# Patient Record
Sex: Male | Born: 1935 | Race: Black or African American | Hispanic: No | Marital: Married | Smoking: Former smoker
Health system: Southern US, Community
[De-identification: ages and names within clinical notes are randomized; demographics above are authoritative.]

## PROBLEM LIST (undated history)

## (undated) DIAGNOSIS — E7800 Pure hypercholesterolemia, unspecified: Secondary | ICD-10-CM

## (undated) DIAGNOSIS — E119 Type 2 diabetes mellitus without complications: Secondary | ICD-10-CM

## (undated) DIAGNOSIS — J449 Chronic obstructive pulmonary disease, unspecified: Secondary | ICD-10-CM

## (undated) DIAGNOSIS — M25569 Pain in unspecified knee: Secondary | ICD-10-CM

## (undated) DIAGNOSIS — I4891 Unspecified atrial fibrillation: Secondary | ICD-10-CM

## (undated) DIAGNOSIS — M549 Dorsalgia, unspecified: Secondary | ICD-10-CM

## (undated) DIAGNOSIS — I10 Essential (primary) hypertension: Secondary | ICD-10-CM

## (undated) HISTORY — PX: BACK SURGERY: SHX140

## (undated) HISTORY — PX: TRANSURETHRAL RESECTION OF PROSTATE: SHX73

## (undated) HISTORY — PX: JOINT REPLACEMENT: SHX530

## (undated) HISTORY — PX: MOUTH SURGERY: SHX715

## (undated) HISTORY — PX: HIP SURGERY: SHX245

## (undated) HISTORY — PX: REPLACEMENT TOTAL KNEE: SUR1224

---

## 2002-07-30 ENCOUNTER — Emergency Department (HOSPITAL_COMMUNITY): Admission: EM | Admit: 2002-07-30 | Discharge: 2002-07-30 | Payer: Self-pay | Admitting: Emergency Medicine

## 2002-12-04 ENCOUNTER — Emergency Department (HOSPITAL_COMMUNITY): Admission: EM | Admit: 2002-12-04 | Discharge: 2002-12-04 | Payer: Self-pay | Admitting: Emergency Medicine

## 2008-04-27 ENCOUNTER — Encounter: Admission: RE | Admit: 2008-04-27 | Discharge: 2008-05-21 | Payer: Self-pay | Admitting: Orthopedic Surgery

## 2015-06-21 DIAGNOSIS — M1990 Unspecified osteoarthritis, unspecified site: Secondary | ICD-10-CM | POA: Insufficient documentation

## 2015-10-11 DIAGNOSIS — M47817 Spondylosis without myelopathy or radiculopathy, lumbosacral region: Secondary | ICD-10-CM | POA: Insufficient documentation

## 2015-12-11 ENCOUNTER — Observation Stay (HOSPITAL_COMMUNITY)
Admission: EM | Admit: 2015-12-11 | Discharge: 2015-12-12 | Disposition: A | Discharge status: Home / Self Care | Payer: Non-veteran care | Attending: Internal Medicine | Admitting: Internal Medicine

## 2015-12-11 ENCOUNTER — Encounter (HOSPITAL_COMMUNITY): Payer: Self-pay | Admitting: Emergency Medicine

## 2015-12-11 ENCOUNTER — Emergency Department (HOSPITAL_COMMUNITY): Payer: Non-veteran care

## 2015-12-11 DIAGNOSIS — I482 Chronic atrial fibrillation, unspecified: Secondary | ICD-10-CM

## 2015-12-11 DIAGNOSIS — E119 Type 2 diabetes mellitus without complications: Secondary | ICD-10-CM | POA: Diagnosis not present

## 2015-12-11 DIAGNOSIS — Z7982 Long term (current) use of aspirin: Secondary | ICD-10-CM | POA: Diagnosis not present

## 2015-12-11 DIAGNOSIS — Z87891 Personal history of nicotine dependence: Secondary | ICD-10-CM | POA: Insufficient documentation

## 2015-12-11 DIAGNOSIS — R55 Syncope and collapse: Secondary | ICD-10-CM | POA: Diagnosis not present

## 2015-12-11 DIAGNOSIS — J449 Chronic obstructive pulmonary disease, unspecified: Secondary | ICD-10-CM | POA: Diagnosis not present

## 2015-12-11 DIAGNOSIS — Z79899 Other long term (current) drug therapy: Secondary | ICD-10-CM | POA: Insufficient documentation

## 2015-12-11 DIAGNOSIS — I10 Essential (primary) hypertension: Secondary | ICD-10-CM | POA: Insufficient documentation

## 2015-12-11 DIAGNOSIS — G8929 Other chronic pain: Secondary | ICD-10-CM | POA: Diagnosis not present

## 2015-12-11 DIAGNOSIS — G4733 Obstructive sleep apnea (adult) (pediatric): Secondary | ICD-10-CM | POA: Diagnosis not present

## 2015-12-11 DIAGNOSIS — I459 Conduction disorder, unspecified: Secondary | ICD-10-CM

## 2015-12-11 DIAGNOSIS — Z7901 Long term (current) use of anticoagulants: Secondary | ICD-10-CM | POA: Insufficient documentation

## 2015-12-11 DIAGNOSIS — I4891 Unspecified atrial fibrillation: Secondary | ICD-10-CM | POA: Diagnosis present

## 2015-12-11 DIAGNOSIS — E785 Hyperlipidemia, unspecified: Secondary | ICD-10-CM | POA: Insufficient documentation

## 2015-12-11 HISTORY — DX: Pain in unspecified knee: M25.569

## 2015-12-11 HISTORY — DX: Essential (primary) hypertension: I10

## 2015-12-11 HISTORY — DX: Dorsalgia, unspecified: M54.9

## 2015-12-11 HISTORY — DX: Unspecified atrial fibrillation: I48.91

## 2015-12-11 HISTORY — DX: Type 2 diabetes mellitus without complications: E11.9

## 2015-12-11 HISTORY — DX: Pure hypercholesterolemia, unspecified: E78.00

## 2015-12-11 LAB — COMPREHENSIVE METABOLIC PANEL
ALT: 14 U/L — ABNORMAL LOW (ref 17–63)
AST: 22 U/L (ref 15–41)
Albumin: 3.2 g/dL — ABNORMAL LOW (ref 3.5–5.0)
Alkaline Phosphatase: 70 U/L (ref 38–126)
Anion gap: 5 (ref 5–15)
BUN: 20 mg/dL (ref 6–20)
CO2: 23 mmol/L (ref 22–32)
Calcium: 9.3 mg/dL (ref 8.9–10.3)
Chloride: 109 mmol/L (ref 101–111)
Creatinine, Ser: 1.33 mg/dL — ABNORMAL HIGH (ref 0.61–1.24)
GFR calc Af Amer: 55 mL/min — ABNORMAL LOW (ref >60)
GFR calc non Af Amer: 47 mL/min — ABNORMAL LOW (ref >60)
Glucose, Bld: 175 mg/dL — ABNORMAL HIGH (ref 65–99)
Potassium: 4.6 mmol/L (ref 3.5–5.1)
Sodium: 137 mmol/L (ref 135–145)
Total Bilirubin: 0.6 mg/dL (ref 0.3–1.2)
Total Protein: 6.2 g/dL — ABNORMAL LOW (ref 6.5–8.1)

## 2015-12-11 LAB — GLUCOSE, CAPILLARY
GLUCOSE-CAPILLARY: 101 mg/dL — AB (ref 65–99)
Glucose-Capillary: 138 mg/dL — ABNORMAL HIGH (ref 65–99)

## 2015-12-11 LAB — CBC WITH DIFFERENTIAL/PLATELET
Basophils Absolute: 0 10*3/uL (ref 0.0–0.1)
Basophils Relative: 0 %
Eosinophils Absolute: 0.1 10*3/uL (ref 0.0–0.7)
Eosinophils Relative: 1 %
HCT: 38 % — ABNORMAL LOW (ref 39.0–52.0)
Hemoglobin: 12.3 g/dL — ABNORMAL LOW (ref 13.0–17.0)
Lymphocytes Relative: 20 %
Lymphs Abs: 1.2 10*3/uL (ref 0.7–4.0)
MCH: 28.5 pg (ref 26.0–34.0)
MCHC: 32.4 g/dL (ref 30.0–36.0)
MCV: 88 fL (ref 78.0–100.0)
Monocytes Absolute: 0.6 10*3/uL (ref 0.1–1.0)
Monocytes Relative: 11 %
Neutro Abs: 4.1 10*3/uL (ref 1.7–7.7)
Neutrophils Relative %: 68 %
Platelets: 250 10*3/uL (ref 150–400)
RBC: 4.32 MIL/uL (ref 4.22–5.81)
RDW: 14.2 % (ref 11.5–15.5)
WBC: 6 10*3/uL (ref 4.0–10.5)

## 2015-12-11 LAB — TROPONIN I: Troponin I: <0.03 ng/mL

## 2015-12-11 LAB — PROTIME-INR
INR: 2.87
Prothrombin Time: 30.6 seconds — ABNORMAL HIGH (ref 11.4–15.2)

## 2015-12-11 LAB — I-STAT TROPONIN, ED: Troponin i, poc: 0 ng/mL (ref 0.00–0.08)

## 2015-12-11 MED ORDER — PANTOPRAZOLE SODIUM 40 MG PO TBEC
40.0000 mg | DELAYED_RELEASE_TABLET | Freq: Every day | ORAL | Status: DC
  Administered 2015-12-11: 40 mg via ORAL
  Filled 2015-12-11: qty 1

## 2015-12-11 MED ORDER — ACETAMINOPHEN 325 MG PO TABS: 650.0000 mg | ORAL_TABLET | Freq: Four times a day (QID) | ORAL | Status: DC | PRN

## 2015-12-11 MED ORDER — INSULIN ASPART 100 UNIT/ML ~~LOC~~ SOLN: 0.0000 [IU] | Freq: Every day | SUBCUTANEOUS | Status: DC

## 2015-12-11 MED ORDER — WARFARIN - PHARMACIST DOSING INPATIENT: Freq: Every day | Status: DC

## 2015-12-11 MED ORDER — TIOTROPIUM BROMIDE MONOHYDRATE 18 MCG IN CAPS
18.0000 ug | ORAL_CAPSULE | Freq: Every day | RESPIRATORY_TRACT | Status: DC
  Administered 2015-12-12: 18 ug via RESPIRATORY_TRACT
  Filled 2015-12-11: qty 5

## 2015-12-11 MED ORDER — ONDANSETRON HCL 4 MG/2ML IJ SOLN: 4.0000 mg | Freq: Four times a day (QID) | INTRAMUSCULAR | Status: DC | PRN

## 2015-12-11 MED ORDER — LATANOPROST 0.005 % OP SOLN
1.0000 [drp] | Freq: Every day | OPHTHALMIC | Status: DC
  Administered 2015-12-11: 1 [drp] via OPHTHALMIC
  Filled 2015-12-11: qty 2.5

## 2015-12-11 MED ORDER — ASPIRIN 81 MG PO CHEW
81.0000 mg | CHEWABLE_TABLET | Freq: Every day | ORAL | Status: DC
  Administered 2015-12-12: 81 mg via ORAL
  Filled 2015-12-11: qty 1

## 2015-12-11 MED ORDER — INSULIN ASPART 100 UNIT/ML ~~LOC~~ SOLN: 0.0000 [IU] | Freq: Three times a day (TID) | SUBCUTANEOUS | Status: DC

## 2015-12-11 MED ORDER — HYPROMELLOSE (GONIOSCOPIC) 2.5 % OP SOLN
1.0000 [drp] | Freq: Four times a day (QID) | OPHTHALMIC | Status: DC | PRN
  Filled 2015-12-11: qty 15

## 2015-12-11 MED ORDER — MOMETASONE FURO-FORMOTEROL FUM 200-5 MCG/ACT IN AERO
2.0000 | INHALATION_SPRAY | Freq: Two times a day (BID) | RESPIRATORY_TRACT | Status: DC
  Administered 2015-12-11 – 2015-12-12 (×2): 2 via RESPIRATORY_TRACT
  Filled 2015-12-11: qty 8.8

## 2015-12-11 MED ORDER — BRIMONIDINE TARTRATE 0.2 % OP SOLN
1.0000 [drp] | Freq: Two times a day (BID) | OPHTHALMIC | Status: DC
  Administered 2015-12-11 – 2015-12-12 (×2): 1 [drp] via OPHTHALMIC
  Filled 2015-12-11: qty 5

## 2015-12-11 NOTE — Progress Notes (Signed)
New admit, no distress ra, vitals stable, Reported off to night shift nurse, RT assessing for a CPAP.

## 2015-12-11 NOTE — Progress Notes (Signed)
Patient set up on CPAP for HS using nasal mask and room air.  10 cmH20 used, as patient is unsure of his home settings.  Patient is familiar with equipment and procedure.

## 2015-12-11 NOTE — H&P (Signed)
Date: 12/11/2015               Patient Name:  Jordan Hoffman MRN: [id number]  DOB: 01-Jun-1928 Age / Sex: 82 y.o., male   PCP: No Pcp Per Patient         Medical Service: Internal Medicine Teaching Service         Attending Physician: Dr. Sid Falcon, MD    First Contact: Dr. Inda Castle Pager: [phone]  Second Contact: Dr. Benjamine Mola Pager: [phone]       After Hours (After 5p/  First Contact Pager: [phone]  weekends / holidays): Second Contact Pager: [phone]   Chief Complaint: "I sat down to eat breakfast and almost passed out."  History of Present Illness: Jordan Hoffman is an 82 year old man with history of AFib, DM2, COPD, HL, and HTN who presents with near syncopal episode.  This morning, Jordan Hoffman was in his usual state of health.  He woke up and was slightly short of breath while getting dressed and walking around the house, but no worse than usual.  He then went to breakfast at [employer] with hs family.  He was standing waiting for a table for 20-30 minutes, and shortly after he sat down he began to feel hot, dizzy, and diaphoretic, then nearly passed out.  According to his grandson, he slumped over the table with his head in his hands for 2-3 minutes.  He did not lose consciousness, and continued talking the whole time.  By the time EMS arrived and he was placed on the stretched and rolled outside he was feeling greatly improved.  No palpitations, dyspnea, chest pain, weakness/numbness, vertigo, headache, diplopia, facial droop, dysarthria.  EMS reported several 2-3s pauses while on cardiac monitor with HRs 23-80 bpm, gave 500 mL NS.  In the ED he has been asymptomatic, in atrial fibrillation with normal rates, feels near baseline with the exception of some fatigue.  Through yesterday he has been eating and drinking well.  He sometimes wakes up at night gasping for breath, which he attributes to his OSA.  Sleeps on one pillow with an adjustable bed at a shallow incline, which has not  changed recently.  He does not regularly exercise and will get short of breath walking or going up stairs, but has not noted any change in his exercise capacity.  He   He reports a recent heart catheterization at the [facility] in 09/2015 which showed 40% occlusion of one vessel.  Has long history of AFib, and reports recent discussions with his [place] cardiologist about potentially needing a pacemaker.  Meds:  Current Meds  Medication Sig  . acetaminophen (TYLENOL) 325 MG tablet Take 650-975 mg by mouth 3 (three) times daily as needed.  Marland Kitchen albuterol (PROVENTIL HFA) 108 (90 Base) MCG/ACT inhaler Inhale 2 puffs into the lungs every 6 (six) hours as needed for wheezing or shortness of breath.  Marland Kitchen aspirin 81 MG chewable tablet Chew 81 mg by mouth daily.  . brimonidine (ALPHAGAN) 0.2 % ophthalmic solution Place 1 drop into both eyes 2 (two) times daily.  . budesonide-formoterol (SYMBICORT) 160-4.5 MCG/ACT inhaler Inhale 2 puffs into the lungs 2 (two) times daily.  Marland Kitchen CALCIUM PO Take 1 tablet by mouth daily.  . Cholecalciferol (VITAMIN D3) 1000 units CAPS Take 1 capsule by mouth daily.  . diphenhydrAMINE (BENADRYL) 25 MG tablet Take 25 mg by mouth every 6 (six) hours as needed for sleep.  Marland Kitchen doxycycline (ADOXA) 50  MG tablet Take 50 mg by mouth daily.  Marland Kitchen gabapentin (NEURONTIN) 300 MG capsule Take 300 mg by mouth 3 (three) times daily.  . hydroxypropyl methylcellulose / hypromellose (ISOPTO TEARS / GONIOVISC) 2.5 % ophthalmic solution Place 1 drop into both eyes 4 (four) times daily as needed for dry eyes.  Marland Kitchen latanoprost (XALATAN) 0.005 % ophthalmic solution Place 1 drop into both eyes at bedtime.  Marland Kitchen loratadine (CLARITIN) 10 MG tablet Take 10 mg by mouth daily.  Marland Kitchen losartan (COZAAR) 25 MG tablet Take 25 mg by mouth daily.  Marland Kitchen omeprazole (PRILOSEC) 40 MG capsule Take 40 mg by mouth daily.  Marland Kitchen tiotropium (SPIRIVA) 18 MCG inhalation capsule Place 18 mcg into inhaler and inhale daily.  Marland Kitchen warfarin (COUMADIN) 7.5 MG  tablet Take 7.5 mg by mouth daily.     Allergies: Allergies as of 12/11/2015 - Review Complete 12/11/2015  Allergen Reaction Noted  . Atorvastatin Other (See Comments) 10/11/2015  . Ciprofloxacin  12/11/2015  . Codeine  12/11/2015  . Erythromycin  12/11/2015  . Flunisolide Other (See Comments) 10/11/2015  . Lisinopril  12/11/2015  . Loratadine  12/11/2015  . Niacin and related  12/11/2015  . Penicillins Hives 12/11/2015  . Pravastatin Other (See Comments) 10/11/2015  . Simvastatin Other (See Comments) 10/11/2015  . Statins  12/11/2015  . Sulfa antibiotics  12/11/2015  . Zantac [ranitidine]  12/11/2015   Past Medical History:  Diagnosis Date  . Atrial fibrillation ([place])   . Back pain   . Diabetes mellitus without complication ([place])   . High cholesterol   . Hypertension   . Knee pain     Family History:  Mother and father with lung cancer, HTN No MI or CVA  Social History: No alcohol Former smoker, quit 30+ years ago, 50 pack year history No illicit drugs  Review of Systems: A complete ROS was negative except as per HPI.  Physical Exam: Blood pressure 131/80, pulse 67, temperature 97.9 F (36.6 C), temperature source Oral, resp. rate 20, height 6\' 4"  (1.93 m), weight 222 lb 14.2 oz (101.1 kg), SpO2 96 %.  Physical Exam  Constitutional:  Well developed elderly gentleman, appears stated age, in no distress  HENT:  Head: Normocephalic and atraumatic.  Mouth/Throat: Oropharynx is clear and moist.  Eyes: Conjunctivae are normal. Pupils are equal, round, and reactive to light. No scleral icterus.  Arcus senilis OU  Neck: Normal range of motion. Neck supple. No JVD present.  Cardiovascular:  Regular rate Irregularly irregular rhythm Normal S1 S2 No MRG  Pulmonary/Chest: Effort normal and breath sounds normal.  Trace bibasilar crackles  Abdominal: Soft. He exhibits no distension. There is no tenderness.  Musculoskeletal: He exhibits no edema or tenderness.    Neurological:  Alert and oriented CN intact Strength and sensation grossly intact and symmetric in all extremities  Skin: Skin is warm and dry.  Psychiatric: He has a normal mood and affect. His behavior is normal.    CBC Latest Ref Rng & Units 12/11/2015  WBC 4.0 - 10.5 K/uL 6.0  Hemoglobin 13.0 - 17.0 g/dL 12.3(L)  Hematocrit 39.0 - 52.0 % 38.0(L)  Platelets 150 - 400 K/uL 250   CMP Latest Ref Rng & Units 12/11/2015  Glucose 65 - 99 mg/dL 175(H)  BUN 6 - 20 mg/dL 20  Creatinine 0.61 - 1.24 mg/dL 1.33(H)  Sodium 135 - 145 mmol/L 137  Potassium 3.5 - 5.1 mmol/L 4.6  Chloride 101 - 111 mmol/L 109  CO2 22 - 32 mmol/L  23  Calcium 8.9 - 10.3 mg/dL 9.3  Total Protein 6.5 - 8.1 g/dL 6.2(L)  Total Bilirubin 0.3 - 1.2 mg/dL 0.6  Alkaline Phos 38 - 126 U/L 70  AST 15 - 41 U/L 22  ALT 17 - 63 U/L 14(L)   Troponin (Point of Care Test)  Recent Labs  12/11/15 1356  TROPIPOC 0.00   Component     Latest Ref Rng & Units 12/11/2015  Prothrombin Time     11.4 - 15.2 seconds 30.6 (H)  INR      2.87   EKG 12/11/2015 1327 Atrial fibrillation, regular rate, nonspecific ST changes. No priors.  Chest Radiograph AP Port 12/11/2015 IMPRESSION: There is elevation of the left hemidiaphragm. Left basilar atelectasis is noted. No pulmonary edema. No segmental infiltrate.  Assessment & Plan by Problem: Principal Problem:   Near syncope Active Problems:   Atrial fibrillation (HCC)   COPD (chronic obstructive pulmonary disease) (HCC)   OSA (obstructive sleep apnea)   Type 2 diabetes mellitus ([place])  82 year old man with known arrhythmia who had near syncopal episode with no prodromal symptoms and rapid return to baseline.  Tachy/bradyarrhythmia or vasovagal syndrome seem to be most likely etiologies.   #Presyncope His near syncopal episode after prolonged standing and without prodrome are most concerning for arrhythmia or vasovagal syndrome.  [place] of prodromal symptoms and standing to  sitting postural change make orthostasis less likely.  No focal neurologic deficts and rapid return to baseline make CVA less likely, and his INR is therapeutic.  No convulsions, LOC, postictal state to suggest seizure.  No chest discomfort or dyspnea to suggest ACS, and his EKG is nonischemic and initial troponin negative. -Telemetry -Trend troponins -Orthostatics -Repat EKG in AM or if symptoms  #Atrial Fibrillation CHA2DS2-Vasc 4. -Continue warfarin -Request [facility]  #COPD -Continue home Spiriva, Advair -PRN DuoNebs  #DM2 -Hold oral hypoglycemics -SSI  #HTN -Continue home   #Chronic Pain -Continue Gabapentin 300 mg TID   Dispo: Admit patient to Observation with expected length of stay less than 2 midnights.  Signed: Minus Liberty, MD 12/11/2015, 6:30 PM  Pager: [phone]

## 2015-12-11 NOTE — ED Provider Notes (Signed)
[place] DEPT Provider Note   CSN: [id number] Arrival date & time: 12/11/15  1322     History   Chief Complaint Chief Complaint  Patient presents with  . Near Syncope    HPI Jordan Hoffman is a 82 y.o. male.  The history is provided by the patient and medical records.  Near Syncope    82 year old male with history of A. fib, chronic back pain, diabetes, hyperlipidemia, hypertension, presenting to the ED following a near syncopal episode. Patient was at a restaurant eating with his family when he became dizzy diaphoretic, and dazed.  No fall to the ground, no head injury. Upon EMS arrival, patient was no longer diaphoretic.  He was placed on the cardiac monitor and had several 2-3 second pauses.  He remains in AFIB which is chronic for him.  He takes coumadin regularly.  Denies prior hx of CABG or stenting.  States he had a cold about 3 weeks ago, that cleared up with medications from his doctor.  Adjusted his AFIB medications about 2 months ago, have tolerated changes well.  States he was on Tikosyn and beta blockers at one point.  There have been some discussions with cardiology about possible pacemaker placement.  Patient is at his neurologic baseline per family.  Patient denies any chest pain or SOB.  Patient does use 2L O2 via nasal cannula PRN.  He also uses CPAP at night for sleep apnea.  Past Medical History:  Diagnosis Date  . Atrial fibrillation ([place])   . Back pain   . Diabetes mellitus without complication ([place])   . High cholesterol   . Hypertension   . Knee pain     There are no active problems to display for this patient.   Past Surgical History:  Procedure Laterality Date  . BACK SURGERY    . MOUTH SURGERY    . REPLACEMENT TOTAL KNEE Right   . TRANSURETHRAL RESECTION OF PROSTATE         Home Medications    Prior to Admission medications   Not on File    Family History No family history on file.  Social History Social History  Substance Use  Topics  . Smoking status: Never Smoker  . Smokeless tobacco: Not on file  . Alcohol use No     Allergies   Ciprofloxacin; Codeine; Erythromycin; Lisinopril; Loratadine; Niacin and related; Penicillins; Statins; Sulfa antibiotics; and Zantac [ranitidine]   Review of Systems Review of Systems  Cardiovascular: Positive for near-syncope.  Neurological: Positive for syncope.  All other systems reviewed and are negative.    Physical Exam Updated Vital Signs BP 118/74   Pulse (!) 54   Temp 97.9 F (36.6 C) (Oral)   Resp 16   Ht 6\' 4"  (1.93 m)   Wt 98.9 kg   SpO2 98%   BMI 26.54 kg/m   Physical Exam  Constitutional: He is oriented to person, place, and time. He appears well-developed and well-nourished.  Overall well appearing, not diaphoretic on my exam  HENT:  Head: Normocephalic and atraumatic.  Mouth/Throat: Oropharynx is clear and moist.  Eyes: Conjunctivae and EOM are normal. Pupils are equal, round, and reactive to light.  Neck: Normal range of motion.  Cardiovascular: Normal rate and normal heart sounds.  An irregularly irregular rhythm present.  AFIB, rate controlled Single pause noted on monitor during exam, patient asymptomatic of this  Pulmonary/Chest: Effort normal and breath sounds normal. No respiratory distress. He has no wheezes.  Abdominal: Soft.  Bowel sounds are normal.  Musculoskeletal: Normal range of motion.  Neurological: He is alert and oriented to person, place, and time.  Skin: Skin is warm and dry.  Psychiatric: He has a normal mood and affect.  Nursing note and vitals reviewed.    ED Treatments / Results  Labs (all labs ordered are listed, but only abnormal results are displayed) Labs Reviewed  CBC WITH DIFFERENTIAL/PLATELET - Abnormal; Notable for the following:       Result Value   Hemoglobin 12.3 (*)    HCT 38.0 (*)    All other components within normal limits  COMPREHENSIVE METABOLIC PANEL - Abnormal; Notable for the following:     Glucose, Bld 175 (*)    Creatinine, Ser 1.33 (*)    Total Protein 6.2 (*)    Albumin 3.2 (*)    ALT 14 (*)    GFR calc non Af Amer 47 (*)    GFR calc Af Amer 55 (*)    All other components within normal limits  PROTIME-INR - Abnormal; Notable for the following:    Prothrombin Time 30.6 (*)    All other components within normal limits  I-STAT TROPOININ, ED    EKG  EKG Interpretation  Date/Time:  Saturday December 11 2015 13:27:24 EST Ventricular Rate:  80 PR Interval:    QRS Duration: 101 QT Interval:  372 QTC Calculation: 430 R Axis:   -44 Text Interpretation:  probable Atrial fibrillation , new since last tracing Left anterior fascicular block Borderline T abnormalities, inferior leads Confirmed by KNAPP  MD-J, JON ([id number]) on 12/11/2015 1:43:39 PM Also confirmed by KNAPP  MD-J, JON [phone]), editor [place]  CCT, BEVERLY ([id number])  on 12/11/2015 1:50:22 PM       Radiology Dg Chest Port 1 View  Result Date: 12/11/2015 CLINICAL DATA:  Dizziness EXAM: PORTABLE CHEST 1 VIEW COMPARISON:  None. FINDINGS: Cardiomegaly is noted. There is elevation of the left hemidiaphragm. Left basilar atelectasis is noted. No pulmonary edema. No segmental infiltrate. Degenerative changes bilateral shoulders. IMPRESSION: There is elevation of the left hemidiaphragm. Left basilar atelectasis is noted. No pulmonary edema. No segmental infiltrate. Electronically Signed   By: Lahoma Crocker M.D.   On: 12/11/2015 14:28    Procedures Procedures (including critical care time)  Medications Ordered in ED Medications - No data to display   Initial Impression / Assessment and Plan / ED Course  I have reviewed the triage vital signs and the nursing notes.  Pertinent labs & imaging results that were available during my care of the patient were reviewed by me and considered in my medical decision making (see chart for details).  Clinical Course    82 year old male here with near syncopal episode at a  restaurant. Upon EMS arrival, patient was noted to have conduction delay and questionable pulses.  He does have history of chronic A. fib. They should is primarily followed at the [place]. Per family, there've been discussions about pacemaker placement the past.  Recent medication changes about 2 months ago for same. On my evaluation, patient is awake, alert, oriented to his baseline per family at the bedside. He is not diaphoretic and is in no apparent distress at this time. He is neurologically intact. Denies any chest pain or shortness of breath.  During evaluation, patient did have one episode of slowed conduction.  He was asymptomatic during this.  Labs and CXR pending.    Lab work overall reassuring.  INR therapeutic, feel PE less likely. It appears  that patient has been having issues with conduction delay for a while now based on family history. This may be the source of his near syncope. Given this, feel patient would benefit from admission and observation.  Discussed this with patient and family at bedside, they all acknowledged understanding and agreed with admission.  Discussed with teaching service, will admit for observation.  Temp admit orders placed.  Final Clinical Impressions(s) / ED Diagnoses   Final diagnoses:  Near syncope  Intra-ventricular conduction delay  Chronic atrial fibrillation ([place])  Anticoagulated on Coumadin    New Prescriptions New Prescriptions   No medications on file     Larene Pickett, PA-C 12/11/15 1521    Dorie Rank, MD 12/12/15 1640

## 2015-12-11 NOTE — Progress Notes (Signed)
ANTICOAGULATION CONSULT NOTE - Initial Consult  Pharmacy Consult for Warfarin Indication: atrial fibrillation  Allergies  Allergen Reactions  . Atorvastatin Other (See Comments)  . Ciprofloxacin   . Codeine   . Erythromycin   . Flunisolide Other (See Comments)  . Lisinopril   . Loratadine   . Niacin And Related   . Penicillins Hives  . Pravastatin Other (See Comments)  . Simvastatin Other (See Comments)  . Statins   . Sulfa Antibiotics   . Zantac [Ranitidine]     Patient Measurements: Height: 6\' 4"  (193 cm) Weight: 222 lb 14.2 oz (101.1 kg) (scale QA) IBW/kg (Calculated) : 86.8  Vital Signs: Temp: 97.9 F (36.6 C) (11/25 1654) Temp Source: Oral (11/25 1654) BP: 131/80 (11/25 1654) Pulse Rate: 67 (11/25 1654)  Labs:  Recent Labs  12/11/15 1345  HGB 12.3*  HCT 38.0*  PLT 250  LABPROT 30.6*  INR 2.87  CREATININE 1.33*    Estimated Creatinine Clearance: 49.9 mL/min (by C-G formula based on SCr of 1.33 mg/dL (H)).   Medical History: Past Medical History:  Diagnosis Date  . Atrial fibrillation ([place])   . Back pain   . Diabetes mellitus without complication ([place])   . High cholesterol   . Hypertension   . Knee pain     Medications:  Prescriptions Prior to Admission  Medication Sig Dispense Refill Last Dose  . acetaminophen (TYLENOL) 325 MG tablet Take 650-975 mg by mouth 3 (three) times daily as needed.     Marland Kitchen albuterol (PROVENTIL HFA) 108 (90 Base) MCG/ACT inhaler Inhale 2 puffs into the lungs every 6 (six) hours as needed for wheezing or shortness of breath.     Marland Kitchen amLODipine (NORVASC) 10 MG tablet Take 10 mg by mouth daily.     Marland Kitchen aspirin 81 MG chewable tablet Chew 81 mg by mouth daily.     . brimonidine (ALPHAGAN) 0.2 % ophthalmic solution Place 1 drop into both eyes 2 (two) times daily.     . budesonide-formoterol (SYMBICORT) 160-4.5 MCG/ACT inhaler Inhale 2 puffs into the lungs 2 (two) times daily.     . hydroxypropyl methylcellulose / hypromellose  (ISOPTO TEARS / GONIOVISC) 2.5 % ophthalmic solution Place 1 drop into both eyes 4 (four) times daily as needed for dry eyes.     Marland Kitchen latanoprost (XALATAN) 0.005 % ophthalmic solution Place 1 drop into both eyes at bedtime.      Scheduled:  . aspirin  81 mg Oral Daily  . brimonidine  1 drop Both Eyes BID  . insulin aspart  0-5 Units Subcutaneous QHS  . insulin aspart  0-9 Units Subcutaneous TID WC  . latanoprost  1 drop Both Eyes QHS  . mometasone-formoterol  2 puff Inhalation BID  . pantoprazole  40 mg Oral QHS  . [START ON 12/12/2015] tiotropium  18 mcg Inhalation Daily   Infusions:    Assessment: 82yo male with history of Afib on warfarin PTA. Pharmacy is consulted to dose warfarin for atrial fibrillation. INR on admission is therapeutic at 2.87.  PTA Warfarin Dose: 7.5mg /day with last dose 11/25  Goal of Therapy:  INR 2-3 Monitor platelets by anticoagulation protocol: Yes   Plan:  Hold warfarin tonight x1 Daily INR Monitor s/sx of bleeding  Andrey Cota. Diona Foley, PharmD, [place] Clinical Pharmacist Pager [phone] 12/11/2015,5:10 PM

## 2015-12-11 NOTE — ED Triage Notes (Signed)
Pt arrives via [facility], pt was at a restaurant sitting with family when he became dizzy, diaphoretic and "dazed" per ems report. pts ekg showed 2 second pauses with afib, hx of afib.ems reports HR ranges from 23-80 bpm. Pt received 500 cc normal saline pta. Pt a/ox4, nad.

## 2015-12-12 ENCOUNTER — Encounter (HOSPITAL_COMMUNITY): Payer: Self-pay

## 2015-12-12 DIAGNOSIS — I482 Chronic atrial fibrillation: Secondary | ICD-10-CM | POA: Diagnosis not present

## 2015-12-12 DIAGNOSIS — R55 Syncope and collapse: Secondary | ICD-10-CM

## 2015-12-12 DIAGNOSIS — G4733 Obstructive sleep apnea (adult) (pediatric): Secondary | ICD-10-CM | POA: Diagnosis not present

## 2015-12-12 DIAGNOSIS — J449 Chronic obstructive pulmonary disease, unspecified: Secondary | ICD-10-CM | POA: Diagnosis not present

## 2015-12-12 LAB — TROPONIN I

## 2015-12-12 LAB — GLUCOSE, CAPILLARY
GLUCOSE-CAPILLARY: 100 mg/dL — AB (ref 65–99)
Glucose-Capillary: 130 mg/dL — ABNORMAL HIGH (ref 65–99)

## 2015-12-12 LAB — BASIC METABOLIC PANEL
Anion gap: 8 (ref 5–15)
BUN: 19 mg/dL (ref 6–20)
CHLORIDE: 110 mmol/L (ref 101–111)
CO2: 21 mmol/L — ABNORMAL LOW (ref 22–32)
CREATININE: 1.12 mg/dL (ref 0.61–1.24)
Calcium: 9.4 mg/dL (ref 8.9–10.3)
GFR, EST NON AFRICAN AMERICAN: 58 mL/min — AB (ref >60)
Glucose, Bld: 88 mg/dL (ref 65–99)
Potassium: 5.1 mmol/L (ref 3.5–5.1)
SODIUM: 139 mmol/L (ref 135–145)

## 2015-12-12 LAB — CBC
HCT: 38.6 % — ABNORMAL LOW (ref 39.0–52.0)
HEMOGLOBIN: 12.6 g/dL — AB (ref 13.0–17.0)
MCH: 28.6 pg (ref 26.0–34.0)
MCHC: 32.6 g/dL (ref 30.0–36.0)
MCV: 87.5 fL (ref 78.0–100.0)
PLATELETS: 263 10*3/uL (ref 150–400)
RBC: 4.41 MIL/uL (ref 4.22–5.81)
RDW: 14.6 % (ref 11.5–15.5)
WBC: 5.1 10*3/uL (ref 4.0–10.5)

## 2015-12-12 LAB — PROTIME-INR
INR: 3.01
PROTHROMBIN TIME: 31.8 s — AB (ref 11.4–15.2)

## 2015-12-12 LAB — MAGNESIUM: Magnesium: 1.8 mg/dL (ref 1.7–2.4)

## 2015-12-12 NOTE — Discharge Instructions (Signed)
You were admitted to the hospital because you almost passed out.  We made sure that you did not have a heart attack, and we did not see any strange rhythms on the heart monitor overnight that would have caused you to pass out.  This was most likely not from your heart, but it is important that you follow up with with PCP and cardiologist at the [facility].    Please call your PCP tomorrow to make an appointment in 1-2 weeks.  If you have any chest pain, worse shortness of breath, or feel like you are going to pass out again, please come back to the Emergency Department.  Near-Syncope Introduction Near-syncope is when you suddenly get weak or dizzy, or you feel like you might pass out (faint). During an episode of near-syncope, you may:  Feel dizzy or light-headed.  Feel sick to your stomach (nauseous).  See all white or all black.  Have cold, clammy skin. If you passed out, get help right away.Call your local emergency services (911 in the [place] Do not drive yourself to the hospital. Follow these instructions at home: Pay attention to any changes in your symptoms. Take these actions to help with your condition:  Have someone stay with you until you feel stable.  Do not drive, use machinery, or play sports until your doctor says it is okay.  Keep all follow-up visits as told by your doctor. This is important.  If you start to feel like you might pass out, lie down right away and raise (elevate) your feet above the level of your heart. Breathe deeply and steadily. Wait until all of the symptoms are gone.  Drink enough fluid to keep your pee (urine) clear or pale yellow.  If you are taking blood pressure or heart medicine, get up slowly and spend many minutes getting ready to sit and then stand. This can help with dizziness.  Take over-the-counter and prescription medicines only as told by your doctor. Get help right away if:  You have a very bad headache.  You have unusual pain in your  chest, tummy, or back.  You are bleeding from your mouth or rectum.  You have black or tarry poop (stool).  You have a very fast or uneven heartbeat (palpitations).  You pass out one time or more than once.  You have jerky movements that you cannot control (seizure).  You are confused.  You have trouble walking.  You are very weak.  You have vision problems. These symptoms may be an emergency. Do not wait to see if the symptoms will go away. Get medical help right away. Call your local emergency services (911 in the [place] Do not drive yourself to the hospital.  This information is not intended to replace advice given to you by your health care provider. Make sure you discuss any questions you have with your health care provider. Document Released: 06/21/2007 Document Revised: 06/10/2015 Document Reviewed: 09/16/2014  2017 Elsevier

## 2015-12-12 NOTE — Progress Notes (Signed)
Pt discharged home IV and tele. Discharge instructions given. Questions answered.

## 2015-12-12 NOTE — Progress Notes (Signed)
   Subjective: No acute events overnight.  Reports no syncope, dyspnea, chest pain, or palpitations.  Objective:  BP 138/77 (BP Location: Right Arm)   Pulse (!) 59   Temp 98.2 F (36.8 C) (Oral)   Resp 18   Ht 6\' 4"  (1.93 m)   Wt 221 lb 1.6 oz (100.3 kg) Comment: a scale  SpO2 95%   BMI 26.91 kg/m    Orthostatic VS for the past 24 hrs:  BP- Lying Pulse- Lying BP- Sitting Pulse- Sitting BP- Standing at 0 minutes Pulse- Standing at 0 minutes  12/12/15 1001 - - - - 154/84 103  12/12/15 0959 - - 117/80 87 - -  12/12/15 0957 134/76 91 - - - -  12/11/15 1717 - - - - 103/62 80  12/11/15 1715 - - 109/76 83 - -  12/11/15 1714 115/70 67 - - - -   Telemetry: atrial fibrillation, rates 60s-70s, occasional PVCs, infrequent short pauses <3s  Physical Exam  Constitutional: He is oriented to person, place, and time. He appears well-developed and well-nourished. No distress.  Cardiovascular: Normal rate and regular rhythm.   Pulmonary/Chest: Effort normal and breath sounds normal.  Neurological: He is alert and oriented to person, place, and time.  Psychiatric: He has a normal mood and affect. His behavior is normal.   CBC Latest Ref Rng & Units 12/12/2015 12/11/2015  WBC 4.0 - 10.5 K/uL 5.1 6.0  Hemoglobin 13.0 - 17.0 g/dL 12.6(L) 12.3(L)  Hematocrit 39.0 - 52.0 % 38.6(L) 38.0(L)  Platelets 150 - 400 K/uL 263 250   BMP Latest Ref Rng & Units 12/12/2015 12/11/2015  Glucose 65 - 99 mg/dL 88 175(H)  BUN 6 - 20 mg/dL 19 20  Creatinine 0.61 - 1.24 mg/dL 1.12 1.33(H)  Sodium 135 - 145 mmol/L 139 137  Potassium 3.5 - 5.1 mmol/L 5.1 4.6  Chloride 101 - 111 mmol/L 110 109  CO2 22 - 32 mmol/L 21(L) 23  Calcium 8.9 - 10.3 mg/dL 9.4 9.3   Cardiac Panel (last 3 results)  Recent Labs  12/11/15 1956 12/12/15 0423  TROPONINI <0.03 <0.03    Assessment/Plan:  Principal Problem:   Near syncope Active Problems:   Atrial fibrillation (HCC)   COPD (chronic obstructive pulmonary disease)  (HCC)   OSA (obstructive sleep apnea)   Type 2 diabetes mellitus ([place])  82 year old man with atrial fibrillation who had a presyncopal episode, most likely of vasovagal etiology.  He follows with cardiologist at [facility] and has been told he may need a pacemaker.  #Presyncope Most likely vasovagal with history of prolonged standing prior to event.  Orthostatic negative.  ACS ruled out with nonischemic EKGs and negative troponins.  Telemetry did not reveal hemodynamically significant arrhythmia. -[facility]  #Atrial Fibrillation -No AV nodal blockers due to low HRs -Continue warfarin  #COPD -Continue home Spiriva, Advair -PRN DuoNebs  #DM2 -Hold oral hypoglycemics -SSI  Dispo: Anticipated discharge today.  Minus Liberty, MD 12/12/2015, 1:33 PM Pager: [phone]

## 2015-12-12 NOTE — Discharge Summary (Signed)
Name: Jordan Hoffman MRN: [id number] DOB: Feb 24, 1928 82 y.o. PCP: No Pcp Per Patient  Date of Admission: 12/11/2015  1:22 PM Date of Discharge: 12/12/2015 Attending Physician: Gilles Chiquito, MD  Discharge Diagnosis: 1. Vasovagal Syncope  Principal Problem:   Near syncope Active Problems:   Atrial fibrillation (HCC)   COPD (chronic obstructive pulmonary disease) (HCC)   OSA (obstructive sleep apnea)   Type 2 diabetes mellitus ([place])   Discharge Medications:   Medication List    TAKE these medications   acetaminophen 325 MG tablet Commonly known as:  TYLENOL Take 650-975 mg by mouth 3 (three) times daily as needed.   aspirin 81 MG chewable tablet Chew 81 mg by mouth daily.   brimonidine 0.2 % ophthalmic solution Commonly known as:  ALPHAGAN Place 1 drop into both eyes 2 (two) times daily.   budesonide-formoterol 160-4.5 MCG/ACT inhaler Commonly known as:  SYMBICORT Inhale 2 puffs into the lungs 2 (two) times daily.   CALCIUM PO Take 1 tablet by mouth daily.   diphenhydrAMINE 25 MG tablet Commonly known as:  BENADRYL Take 25 mg by mouth every 6 (six) hours as needed for sleep.   doxycycline 50 MG tablet Commonly known as:  ADOXA Take 50 mg by mouth daily.   gabapentin 300 MG capsule Commonly known as:  NEURONTIN Take 300 mg by mouth 3 (three) times daily.   hydroxypropyl methylcellulose / hypromellose 2.5 % ophthalmic solution Commonly known as:  ISOPTO TEARS / GONIOVISC Place 1 drop into both eyes 4 (four) times daily as needed for dry eyes.   latanoprost 0.005 % ophthalmic solution Commonly known as:  XALATAN Place 1 drop into both eyes at bedtime.   loratadine 10 MG tablet Commonly known as:  CLARITIN Take 10 mg by mouth daily.   losartan 25 MG tablet Commonly known as:  COZAAR Take 25 mg by mouth daily.   omeprazole 40 MG capsule Commonly known as:  PRILOSEC Take 40 mg by mouth daily. Notes to patient:  12/13/2015   PROVENTIL HFA 108 (90 Base)  MCG/ACT inhaler Generic drug:  albuterol Inhale 2 puffs into the lungs every 6 (six) hours as needed for wheezing or shortness of breath.   tiotropium 18 MCG inhalation capsule Commonly known as:  SPIRIVA Place 18 mcg into inhaler and inhale daily.   Vitamin D3 1000 units Caps Take 1 capsule by mouth daily.   warfarin 7.5 MG tablet Commonly known as:  COUMADIN Take 7.5 mg by mouth daily.       Disposition and follow-up:   Jordan Hoffman was discharged from [facility] in Stable condition.  At the hospital follow up visit please address:  1.  Atrial Fibrillation.  Please continue evaluation for permanent pacemaker as required.  2.  Labs / imaging needed at time of follow-up: none  3.  Pending labs/ test needing follow-up: none  Follow-up Appointments: Follow-up Information    [facility]. Schedule an appointment as soon as possible for a visit in 1 week(s).   Specialty:  General Practice Contact information: [place] [phone]           Hospital Course by problem list: Principal Problem:   Near syncope Active Problems:   Atrial fibrillation (HCC)   COPD (chronic obstructive pulmonary disease) (HCC)   OSA (obstructive sleep apnea)   Type 2 diabetes mellitus ([place])   1. Vasovagal Syndrome Mr Zukas presented after a presyncopal episode without  prodrome, palpitations, chest pain, or dyspnea, and with recovery to baseline within minutes.  He was standing up for about 20 minutes waiting to be seated for breakfast, and shortly after he was seated he felt flushed, diaphoretic, and dizzy, then slumped over the table with his head in his hands.  According to his family, he never lost consciousness and was talking and responsive throughout.  EMS reported bradycardia with several 2-3 s pauses on cardiac monitor.  ACS was rule out with nonischemic EKG and serial negative troponins.  He was not orthostatic and had a normal  neurologic exam. He was observed overnight and did not have further symptoms.  He was able to ambulate safely and was discharge with instructions to follow up with his PCP at the [facility].  The most likely etiology of his presyncopal symptoms was deemed to be vasovagal.  2. Atrial Fibrillation He was in AFib through the admission, with rates mostly in 60s-70s, no pauses longer than 3 seconds, and occasional PVCs.  INR was therapeutic at 2.87.   3. COPD No dyspnea or wheezing, continued home COPD inhalers.  4. DM2 His oral hypoglycemics were held while admitted and his BGs rainged from 100 to 138.  Discharge Vitals:   BP 138/77 (BP Location: Right Arm)   Pulse (!) 59   Temp 98.2 F (36.8 C) (Oral)   Resp 18   Ht 6\' 4"  (1.93 m)   Wt 221 lb 1.6 oz (100.3 kg) Comment: a scale  SpO2 95%   BMI 26.91 kg/m   Pertinent Labs, Studies, and Procedures:   CBC Latest Ref Rng & Units 12/12/2015 12/11/2015  WBC 4.0 - 10.5 K/uL 5.1 6.0  Hemoglobin 13.0 - 17.0 g/dL 12.6(L) 12.3(L)  Hematocrit 39.0 - 52.0 % 38.6(L) 38.0(L)  Platelets 150 - 400 K/uL 263 250   BMP Latest Ref Rng & Units 12/12/2015 12/11/2015  Glucose 65 - 99 mg/dL 88 175(H)  BUN 6 - 20 mg/dL 19 20  Creatinine 0.61 - 1.24 mg/dL 1.12 1.33(H)  Sodium 135 - 145 mmol/L 139 137  Potassium 3.5 - 5.1 mmol/L 5.1 4.6  Chloride 101 - 111 mmol/L 110 109  CO2 22 - 32 mmol/L 21(L) 23  Calcium 8.9 - 10.3 mg/dL 9.4 9.3   Cardiac Panel (last 3 results)  Recent Labs  12/11/15 1956 12/12/15 0423  TROPONINI <0.03 <0.03     Discharge Instructions: Discharge Instructions    Diet - low sodium heart healthy    Complete by:  As directed    Increase activity slowly    Complete by:  As directed      You were admitted to the hospital because you almost passed out.  We made sure that you did not have a heart attack, and we did not see any strange rhythms on the heart monitor overnight that would have caused you to pass out.  This was most  likely not from your heart, but it is important that you follow up with with PCP and cardiologist at the [facility].    Please call your PCP tomorrow to make an appointment in 1-2 weeks.  If you have any chest pain, worse shortness of breath, or feel like you are going to pass out again, please come back to the Emergency Department.  Signed: Minus Liberty, MD 12/12/2015, 1:30 PM   Pager: [phone]

## 2019-10-09 ENCOUNTER — Encounter (HOSPITAL_BASED_OUTPATIENT_CLINIC_OR_DEPARTMENT_OTHER): Payer: Self-pay

## 2019-10-09 ENCOUNTER — Emergency Department (HOSPITAL_BASED_OUTPATIENT_CLINIC_OR_DEPARTMENT_OTHER): Payer: No Typology Code available for payment source

## 2019-10-09 ENCOUNTER — Emergency Department (HOSPITAL_BASED_OUTPATIENT_CLINIC_OR_DEPARTMENT_OTHER)
Admission: EM | Admit: 2019-10-09 | Discharge: 2019-10-09 | Disposition: A | Discharge status: Home / Self Care | Payer: No Typology Code available for payment source | Attending: Emergency Medicine | Admitting: Emergency Medicine

## 2019-10-09 ENCOUNTER — Other Ambulatory Visit: Payer: Self-pay

## 2019-10-09 DIAGNOSIS — Z7982 Long term (current) use of aspirin: Secondary | ICD-10-CM | POA: Diagnosis not present

## 2019-10-09 DIAGNOSIS — Z87891 Personal history of nicotine dependence: Secondary | ICD-10-CM | POA: Diagnosis not present

## 2019-10-09 DIAGNOSIS — E119 Type 2 diabetes mellitus without complications: Secondary | ICD-10-CM | POA: Diagnosis not present

## 2019-10-09 DIAGNOSIS — J449 Chronic obstructive pulmonary disease, unspecified: Secondary | ICD-10-CM | POA: Diagnosis not present

## 2019-10-09 DIAGNOSIS — Z7901 Long term (current) use of anticoagulants: Secondary | ICD-10-CM | POA: Diagnosis not present

## 2019-10-09 DIAGNOSIS — I482 Chronic atrial fibrillation, unspecified: Secondary | ICD-10-CM | POA: Diagnosis not present

## 2019-10-09 DIAGNOSIS — I952 Hypotension due to drugs: Secondary | ICD-10-CM | POA: Diagnosis not present

## 2019-10-09 DIAGNOSIS — Z96651 Presence of right artificial knee joint: Secondary | ICD-10-CM | POA: Diagnosis not present

## 2019-10-09 DIAGNOSIS — R0609 Other forms of dyspnea: Secondary | ICD-10-CM | POA: Diagnosis not present

## 2019-10-09 DIAGNOSIS — Z79899 Other long term (current) drug therapy: Secondary | ICD-10-CM | POA: Diagnosis not present

## 2019-10-09 DIAGNOSIS — I10 Essential (primary) hypertension: Secondary | ICD-10-CM | POA: Insufficient documentation

## 2019-10-09 DIAGNOSIS — I959 Hypotension, unspecified: Secondary | ICD-10-CM | POA: Diagnosis present

## 2019-10-09 DIAGNOSIS — R531 Weakness: Secondary | ICD-10-CM | POA: Insufficient documentation

## 2019-10-09 LAB — CBC WITH DIFFERENTIAL/PLATELET
Abs Immature Granulocytes: 0.02 10*3/uL (ref 0.00–0.07)
Basophils Absolute: 0 10*3/uL (ref 0.0–0.1)
Basophils Relative: 1 %
Eosinophils Absolute: 0 10*3/uL (ref 0.0–0.5)
Eosinophils Relative: 1 %
HCT: 40.2 % (ref 39.0–52.0)
Hemoglobin: 12.6 g/dL — ABNORMAL LOW (ref 13.0–17.0)
Immature Granulocytes: 0 %
Lymphocytes Relative: 17 %
Lymphs Abs: 1 10*3/uL (ref 0.7–4.0)
MCH: 29.6 pg (ref 26.0–34.0)
MCHC: 31.3 g/dL (ref 30.0–36.0)
MCV: 94.4 fL (ref 80.0–100.0)
Monocytes Absolute: 0.7 10*3/uL (ref 0.1–1.0)
Monocytes Relative: 12 %
Neutro Abs: 4.1 10*3/uL (ref 1.7–7.7)
Neutrophils Relative %: 69 %
Platelets: 230 10*3/uL (ref 150–400)
RBC: 4.26 MIL/uL (ref 4.22–5.81)
RDW: 14.3 % (ref 11.5–15.5)
WBC: 5.9 10*3/uL (ref 4.0–10.5)
nRBC: 0 % (ref 0.0–0.2)

## 2019-10-09 LAB — COMPREHENSIVE METABOLIC PANEL
ALT: 11 U/L (ref 0–44)
AST: 20 U/L (ref 15–41)
Albumin: 3.7 g/dL (ref 3.5–5.0)
Alkaline Phosphatase: 76 U/L (ref 38–126)
Anion gap: 12 (ref 5–15)
BUN: 34 mg/dL — ABNORMAL HIGH (ref 8–23)
CO2: 23 mmol/L (ref 22–32)
Calcium: 9.7 mg/dL (ref 8.9–10.3)
Chloride: 104 mmol/L (ref 98–111)
Creatinine, Ser: 1.65 mg/dL — ABNORMAL HIGH (ref 0.61–1.24)
GFR calc Af Amer: 42 mL/min — ABNORMAL LOW (ref >60)
GFR calc non Af Amer: 36 mL/min — ABNORMAL LOW (ref >60)
Glucose, Bld: 181 mg/dL — ABNORMAL HIGH (ref 70–99)
Potassium: 4.2 mmol/L (ref 3.5–5.1)
Sodium: 139 mmol/L (ref 135–145)
Total Bilirubin: 0.6 mg/dL (ref 0.3–1.2)
Total Protein: 7.3 g/dL (ref 6.5–8.1)

## 2019-10-09 LAB — PROTIME-INR
INR: 1.3 — ABNORMAL HIGH (ref 0.8–1.2)
Prothrombin Time: 16.1 seconds — ABNORMAL HIGH (ref 11.4–15.2)

## 2019-10-09 LAB — TROPONIN I (HIGH SENSITIVITY)
Troponin I (High Sensitivity): 26 ng/L — ABNORMAL HIGH (ref <18)
Troponin I (High Sensitivity): 28 ng/L — ABNORMAL HIGH (ref <18)

## 2019-10-09 NOTE — ED Triage Notes (Addendum)
Pt states he takes his BP daily-today BP was 80s/60s-[facility] for "weakness and 'bout passing out" per wife-pt reports feeling weak-[facility] paper reads plans for pt to wear a heart monitor ASAP-NAD-to triage in w/c

## 2019-10-09 NOTE — ED Notes (Signed)
EKG done by someone else

## 2019-10-09 NOTE — ED Provider Notes (Signed)
[facility] EMERGENCY DEPARTMENT Provider Note   CSN: [id number] Arrival date & time: 10/09/19  1404     History Chief Complaint  Patient presents with  . Hypotension    Jordan Hoffman is a 86 y.o. male.  HPI Patient discharged from the [facility] hospital 5 days ago.  Since discharge, patient has felt lightheaded and generally weak.  He reports he gets close to passing out.  He generally has been getting symptoms of lightheadedness and able to sit down in a chair before he collapses to the ground.  He does not note much pain before these episodes although sometimes a little bit of chest pressure occurs.  No headaches proceeding.  Describes as feeling lightheaded but not a spinning or movement quality of dizziness.  No focal weakness numbness or tingling occurs.  Patient does however sometimes worried that he is having a stroke.  He was hospitalized at the [facility] for the symptoms.  He was evaluated with continuous monitoring and lab studies and consultation with cardiology.  Per review of his printed discharge summary, cardiology did not think that he needed a pacemaker.  Patient has chronic atrial fibrillation on Eliquis.  He does take Cozaar 100 mg a day as well as other multiple medications including twice daily gabapentin.  Patient reports several times over the past few days he has measured his blood pressure and has been either 80s over 60s or 90s over 60s.  Other episodes have been normotensive.  He also is taking Lasix 40 mg morning and evening he reports he is putting out a large amount of urine at night.  He is not having any pain or burning.  He is not having any swelling in his extremities.  He is chronically mildly short of breath due to COPD.  Patient does not smoke.  He has quit for over 30 years.  He is not oxygen dependent.  He does use inhalers.    Past Medical History:  Diagnosis Date  . Atrial fibrillation ([place])   . Back pain   . Diabetes mellitus without complication ([place])    . High cholesterol   . Hypertension   . Knee pain     Patient Active Problem List   Diagnosis Date Noted  . Near syncope 12/11/2015  . Atrial fibrillation ([place]) 12/11/2015  . COPD (chronic obstructive pulmonary disease) ([place]) 12/11/2015  . OSA (obstructive sleep apnea) 12/11/2015  . Hypertension 12/11/2015  . Type 2 diabetes mellitus ([place]) 12/11/2015  . Hyperlipidemia 12/11/2015  . Lumbosacral spondylosis without myelopathy 10/11/2015  . Osteoarthritis 06/21/2015    Past Surgical History:  Procedure Laterality Date  . BACK SURGERY    . HIP SURGERY    . MOUTH SURGERY    . REPLACEMENT TOTAL KNEE Right   . TRANSURETHRAL RESECTION OF PROSTATE         No family history on file.  Social History   Tobacco Use  . Smoking status: Former Research scientist (life sciences)  . Smokeless tobacco: Never Used  Vaping Use  . Vaping Use: Never used  Substance Use Topics  . Alcohol use: No  . Drug use: No    Home Medications Prior to Admission medications   Medication Sig Start Date End Date Taking? Authorizing Provider  acetaminophen (TYLENOL) 325 MG tablet Take 650-975 mg by mouth 3 (three) times daily as needed.    [provider]  albuterol (PROVENTIL HFA) 108 (90 Base) MCG/ACT inhaler Inhale 2 puffs into the lungs every 6 (six) hours as  needed for wheezing or shortness of breath.    [provider]  aspirin 81 MG chewable tablet Chew 81 mg by mouth daily.    [provider]  brimonidine (ALPHAGAN) 0.2 % ophthalmic solution Place 1 drop into both eyes 2 (two) times daily.    [provider]  budesonide-formoterol (SYMBICORT) 160-4.5 MCG/ACT inhaler Inhale 2 puffs into the lungs 2 (two) times daily.    [provider]  CALCIUM PO Take 1 tablet by mouth daily.    [provider]  Cholecalciferol (VITAMIN D3) 1000 units CAPS Take 1 capsule by mouth daily.    [provider]  diphenhydrAMINE (BENADRYL) 25 MG tablet Take 25 mg by mouth every 6  (six) hours as needed for sleep.    [provider]  doxycycline (ADOXA) 50 MG tablet Take 50 mg by mouth daily.    [provider]  gabapentin (NEURONTIN) 300 MG capsule Take 300 mg by mouth 3 (three) times daily.    [provider]  hydroxypropyl methylcellulose / hypromellose (ISOPTO TEARS / GONIOVISC) 2.5 % ophthalmic solution Place 1 drop into both eyes 4 (four) times daily as needed for dry eyes.    [provider]  latanoprost (XALATAN) 0.005 % ophthalmic solution Place 1 drop into both eyes at bedtime.    [provider]  loratadine (CLARITIN) 10 MG tablet Take 10 mg by mouth daily.    [provider]  losartan (COZAAR) 25 MG tablet Take 25 mg by mouth daily.    [provider]  omeprazole (PRILOSEC) 40 MG capsule Take 40 mg by mouth daily.    [provider]  tiotropium (SPIRIVA) 18 MCG inhalation capsule Place 18 mcg into inhaler and inhale daily.    [provider]  warfarin (COUMADIN) 7.5 MG tablet Take 7.5 mg by mouth daily.    [provider]    Allergies    Atorvastatin, Ciprofloxacin, Codeine, Erythromycin, Flunisolide, Lisinopril, Loratadine, Niacin and related, Penicillins, Pravastatin, Simvastatin, Statins, Sulfa antibiotics, and Zantac [ranitidine]  Review of Systems   Review of Systems 10 systems reviewed and negative except as per HPI Physical Exam Updated Vital Signs BP 132/81 (BP Location: Right Arm)   Pulse 68   Temp 99.1 F (37.3 C) (Oral)   Resp 18   SpO2 96%   Physical Exam Constitutional:      Comments: Alert nontoxic clinically well in appearance.  No respiratory distress.  Mental status  HENT:     Head: Normocephalic and atraumatic.     Mouth/Throat:     Mouth: Mucous membranes are moist.     Pharynx: Oropharynx is clear.  Eyes:     Extraocular Movements: Extraocular movements intact.  Cardiovascular:     Comments: Irregularly irregular.  No gross rub murmur  gallop. Pulmonary:     Effort: Pulmonary effort is normal.     Breath sounds: Normal breath sounds.  Abdominal:     General: There is no distension.     Palpations: Abdomen is soft.     Tenderness: There is no abdominal tenderness. There is no guarding.  Musculoskeletal:        General: No swelling or tenderness. Normal range of motion.     Right lower leg: No edema.     Left lower leg: No edema.  Skin:    General: Skin is warm and dry.  Neurological:     General: No focal deficit present.     Mental Status: He is oriented  to person, place, and time.     Cranial Nerves: No cranial nerve deficit.     Motor: No weakness.     Coordination: Coordination normal.  Psychiatric:        Mood and Affect: Mood normal.     ED Results / Procedures / Treatments   Labs (all labs ordered are listed, but only abnormal results are displayed) Labs Reviewed  COMPREHENSIVE METABOLIC PANEL - Abnormal; Notable for the following components:      Result Value   Glucose, Bld 181 (*)    BUN 34 (*)    Creatinine, Ser 1.65 (*)    GFR calc non Af Amer 36 (*)    GFR calc Af Amer 42 (*)    All other components within normal limits  CBC WITH DIFFERENTIAL/PLATELET - Abnormal; Notable for the following components:   Hemoglobin 12.6 (*)    All other components within normal limits  PROTIME-INR - Abnormal; Notable for the following components:   Prothrombin Time 16.1 (*)    INR 1.3 (*)    All other components within normal limits  TROPONIN I (HIGH SENSITIVITY) - Abnormal; Notable for the following components:   Troponin I (High Sensitivity) 26 (*)    All other components within normal limits  TROPONIN I (HIGH SENSITIVITY) - Abnormal; Notable for the following components:   Troponin I (High Sensitivity) 28 (*)    All other components within normal limits  URINALYSIS, ROUTINE W REFLEX MICROSCOPIC    EKG EKG Interpretation  Date/Time:  Thursday October 09 2019 14:27:36 EDT Ventricular Rate:   90 PR Interval:    QRS Duration: 102 QT Interval:  448 QTC Calculation: 549 R Axis:   -28 Text Interpretation: Atrial fibrillation Ventricular premature complex Inferior infarct, age indeterminate Prolonged QT interval no sig change from previous Confirmed by Charlesetta Shanks [phone]) on 10/09/2019 3:06:54 PM   Radiology DG Chest 2 View  Result Date: 10/09/2019 CLINICAL DATA:  Near syncope, blood pressure 80/60. EXAM: CHEST - 2 VIEW COMPARISON:  Chest x-ray 12/11/2015 FINDINGS: Similar appearing elevation of left hemidiaphragm. The heart size and mediastinal contours are unchanged. Left base compressive changes. No focal consolidation. Similar-appearing coarsened interstitial markings. No overt pulmonary edema. No pleural effusion. No pneumothorax. No acute osseous abnormality. IMPRESSION: No active cardiopulmonary disease. Electronically Signed   By: Iven Finn M.D.   On: 10/09/2019 18:14    Procedures Procedures (including critical care time)  Medications Ordered in ED Medications - No data to display  ED Course  I have reviewed the triage vital signs and the nursing notes.  Pertinent labs & imaging results that were available during my care of the patient were reviewed by me and considered in my medical decision making (see chart for details).    MDM Rules/Calculators/A&P                          Patient had recent hospitalization at the [place].  This was for similar symptoms.  He was monitored at that time and no atypical rhythm findings identified.  Patient has chronic atrial fibrillation.  His rates range from 60s to 80s while in the emergency department.  Blood pressures are stable and normotensive.  However, at home he is doing episodes of pressures down to 73A over 19F systolic.  I suspect this is due to some amount of volume depletion with Lasix and Cozaar 100 mg daily.  At this time I have counseled the patient and  his wife on decreasing the Cozaar to 50 mg in the morning and  monitoring blood pressures throughout the day to determine if the second dose is needed.  Also, he will decrease his evening Lasix dose as a pressure seem to be lower and he is more symptomatic in the morning and early part of the day.  Careful return precautions and close follow-up instructions provided. Final Clinical Impression(s) / ED Diagnoses Final diagnoses:  Weakness  Hypotension due to medication  Dyspnea on exertion  Chronic atrial fibrillation [facility])    Rx / DC Orders ED Discharge Orders    None       Charlesetta Shanks, MD 10/09/19 [place]

## 2019-10-09 NOTE — Discharge Instructions (Signed)
1.  It appears likely that you are getting lightheaded due to episodes of low blood pressure and mild dehydration due to your Lasix. 2.  Cut your morning Cozaar dose in half.  This is 50 mg in the morning.  Measure your blood pressure approximately 3 times per day.  If your blood pressure is elevating later in the day, systolic 161 or greater, take the other half of your Cozaar. 3.  Decrease your nighttime Lasix dose by half.  Continue to take your normal morning dose.  Do this for 2 evenings and see if your symptoms are improving.  If you are developing any swelling or increasing shortness of breath, resume your full dose of Lasix. 4.  See your family physician for follow-up as soon as possible.  Return to the emergency department immediately if you develop a fever, signs of infection such as a productive cough, abdominal pain, urinary symptoms or other concerning symptoms.

## 2020-01-02 ENCOUNTER — Encounter (HOSPITAL_BASED_OUTPATIENT_CLINIC_OR_DEPARTMENT_OTHER): Payer: Self-pay

## 2020-01-02 ENCOUNTER — Emergency Department (HOSPITAL_BASED_OUTPATIENT_CLINIC_OR_DEPARTMENT_OTHER)
Admission: EM | Admit: 2020-01-02 | Discharge: 2020-01-02 | Disposition: A | Discharge status: Home / Self Care | Payer: No Typology Code available for payment source | Attending: Emergency Medicine | Admitting: Emergency Medicine

## 2020-01-02 ENCOUNTER — Emergency Department (HOSPITAL_BASED_OUTPATIENT_CLINIC_OR_DEPARTMENT_OTHER): Payer: No Typology Code available for payment source

## 2020-01-02 ENCOUNTER — Other Ambulatory Visit: Payer: Self-pay

## 2020-01-02 DIAGNOSIS — Z79899 Other long term (current) drug therapy: Secondary | ICD-10-CM | POA: Insufficient documentation

## 2020-01-02 DIAGNOSIS — I509 Heart failure, unspecified: Secondary | ICD-10-CM

## 2020-01-02 DIAGNOSIS — R531 Weakness: Secondary | ICD-10-CM

## 2020-01-02 DIAGNOSIS — Z87891 Personal history of nicotine dependence: Secondary | ICD-10-CM | POA: Insufficient documentation

## 2020-01-02 DIAGNOSIS — Z7951 Long term (current) use of inhaled steroids: Secondary | ICD-10-CM | POA: Diagnosis not present

## 2020-01-02 DIAGNOSIS — R0602 Shortness of breath: Secondary | ICD-10-CM | POA: Diagnosis present

## 2020-01-02 DIAGNOSIS — Z20822 Contact with and (suspected) exposure to covid-19: Secondary | ICD-10-CM | POA: Insufficient documentation

## 2020-01-02 DIAGNOSIS — I110 Hypertensive heart disease with heart failure: Secondary | ICD-10-CM | POA: Insufficient documentation

## 2020-01-02 DIAGNOSIS — R000 Tachycardia, unspecified: Secondary | ICD-10-CM | POA: Insufficient documentation

## 2020-01-02 DIAGNOSIS — Z7982 Long term (current) use of aspirin: Secondary | ICD-10-CM | POA: Insufficient documentation

## 2020-01-02 DIAGNOSIS — E119 Type 2 diabetes mellitus without complications: Secondary | ICD-10-CM | POA: Diagnosis not present

## 2020-01-02 DIAGNOSIS — Z96651 Presence of right artificial knee joint: Secondary | ICD-10-CM | POA: Insufficient documentation

## 2020-01-02 DIAGNOSIS — Z7901 Long term (current) use of anticoagulants: Secondary | ICD-10-CM | POA: Diagnosis not present

## 2020-01-02 DIAGNOSIS — J449 Chronic obstructive pulmonary disease, unspecified: Secondary | ICD-10-CM | POA: Diagnosis not present

## 2020-01-02 HISTORY — DX: Chronic obstructive pulmonary disease, unspecified: J44.9

## 2020-01-02 LAB — COMPREHENSIVE METABOLIC PANEL
ALT: 15 U/L (ref 0–44)
AST: 23 U/L (ref 15–41)
Albumin: 3.8 g/dL (ref 3.5–5.0)
Alkaline Phosphatase: 68 U/L (ref 38–126)
Anion gap: 10 (ref 5–15)
BUN: 21 mg/dL (ref 8–23)
CO2: 20 mmol/L — ABNORMAL LOW (ref 22–32)
Calcium: 9.7 mg/dL (ref 8.9–10.3)
Chloride: 109 mmol/L (ref 98–111)
Creatinine, Ser: 1.49 mg/dL — ABNORMAL HIGH (ref 0.61–1.24)
GFR, Estimated: 45 mL/min — ABNORMAL LOW (ref >60)
Glucose, Bld: 91 mg/dL (ref 70–99)
Potassium: 4.6 mmol/L (ref 3.5–5.1)
Sodium: 139 mmol/L (ref 135–145)
Total Bilirubin: 1.6 mg/dL — ABNORMAL HIGH (ref 0.3–1.2)
Total Protein: 7.1 g/dL (ref 6.5–8.1)

## 2020-01-02 LAB — CBC WITH DIFFERENTIAL/PLATELET
Abs Immature Granulocytes: 0.02 10*3/uL (ref 0.00–0.07)
Basophils Absolute: 0 10*3/uL (ref 0.0–0.1)
Basophils Relative: 0 %
Eosinophils Absolute: 0 10*3/uL (ref 0.0–0.5)
Eosinophils Relative: 0 %
HCT: 38.4 % — ABNORMAL LOW (ref 39.0–52.0)
Hemoglobin: 12.3 g/dL — ABNORMAL LOW (ref 13.0–17.0)
Immature Granulocytes: 0 %
Lymphocytes Relative: 15 %
Lymphs Abs: 1.1 10*3/uL (ref 0.7–4.0)
MCH: 29.3 pg (ref 26.0–34.0)
MCHC: 32 g/dL (ref 30.0–36.0)
MCV: 91.4 fL (ref 80.0–100.0)
Monocytes Absolute: 0.7 10*3/uL (ref 0.1–1.0)
Monocytes Relative: 10 %
Neutro Abs: 5.5 10*3/uL (ref 1.7–7.7)
Neutrophils Relative %: 75 %
Platelets: 199 10*3/uL (ref 150–400)
RBC: 4.2 MIL/uL — ABNORMAL LOW (ref 4.22–5.81)
RDW: 14.2 % (ref 11.5–15.5)
WBC: 7.4 10*3/uL (ref 4.0–10.5)
nRBC: 0 % (ref 0.0–0.2)

## 2020-01-02 LAB — PROTIME-INR
INR: 1.4 — ABNORMAL HIGH (ref 0.8–1.2)
Prothrombin Time: 16.2 seconds — ABNORMAL HIGH (ref 11.4–15.2)

## 2020-01-02 LAB — TROPONIN I (HIGH SENSITIVITY): Troponin I (High Sensitivity): 29 ng/L — ABNORMAL HIGH (ref <18)

## 2020-01-02 LAB — RESP PANEL BY RT-PCR (FLU A&B, COVID) ARPGX2
Influenza A by PCR: NEGATIVE
Influenza B by PCR: NEGATIVE
SARS Coronavirus 2 by RT PCR: NEGATIVE

## 2020-01-02 LAB — D-DIMER, QUANTITATIVE: D-Dimer, Quant: 0.6 ug/mL-FEU — ABNORMAL HIGH (ref 0.00–0.50)

## 2020-01-02 LAB — BRAIN NATRIURETIC PEPTIDE: B Natriuretic Peptide: 272.2 pg/mL — ABNORMAL HIGH (ref 0.0–100.0)

## 2020-01-02 MED ORDER — FUROSEMIDE 10 MG/ML IJ SOLN
20.0000 mg | Freq: Once | INTRAMUSCULAR | Status: AC
  Administered 2020-01-02: 15:00:00 20 mg via INTRAVENOUS
  Filled 2020-01-02: qty 2

## 2020-01-02 MED ORDER — ALBUTEROL SULFATE HFA 108 (90 BASE) MCG/ACT IN AERS
2.0000 | INHALATION_SPRAY | Freq: Once | RESPIRATORY_TRACT | Status: AC
  Administered 2020-01-02: 14:00:00 2 via RESPIRATORY_TRACT
  Filled 2020-01-02: qty 6.7

## 2020-01-02 MED ORDER — ACETAMINOPHEN 325 MG PO TABS
650.0000 mg | ORAL_TABLET | Freq: Once | ORAL | Status: AC
  Administered 2020-01-02: 15:00:00 650 mg via ORAL
  Filled 2020-01-02: qty 2

## 2020-01-02 NOTE — Discharge Instructions (Signed)
If you develop fever, coughing up blood, new or worsening shortness of breath, chest pain, new or worsening weakness, or any other new/concerning symptoms then return to the ER for evaluation.

## 2020-01-02 NOTE — ED Notes (Signed)
ED Provider at bedside. 

## 2020-01-02 NOTE — ED Triage Notes (Signed)
Pt states over the past week has been increasingly short of breath. Hx of COPD/a.fib. States coughing up phlegm. Dyspnea worse on exertion.

## 2020-01-02 NOTE — ED Provider Notes (Addendum)
[facility] EMERGENCY DEPARTMENT Provider Note   CSN: [id number] Arrival date & time: 01/02/20  1203     History Chief Complaint  Patient presents with  . Shortness of Breath    Jordan Hoffman is a 86 y.o. male.  HPI 86 year old male presents with shortness of breath.  He states he has had all longstanding cough and congestion from COPD.  However over the last 2 weeks its been a little more prominent and worse over the last couple days.  Now is more short of breath.  No chest pain but does have some tightness.  When asked about leg swelling he feels like his right leg is a little swollen compared to the left.  No fevers.   Past Medical History:  Diagnosis Date  . Atrial fibrillation ([place])   . Back pain   . COPD (chronic obstructive pulmonary disease) ([place])   . Diabetes mellitus without complication ([place])   . High cholesterol   . Hypertension   . Knee pain     Patient Active Problem List   Diagnosis Date Noted  . Near syncope 12/11/2015  . Atrial fibrillation ([place]) 12/11/2015  . COPD (chronic obstructive pulmonary disease) ([place]) 12/11/2015  . OSA (obstructive sleep apnea) 12/11/2015  . Hypertension 12/11/2015  . Type 2 diabetes mellitus ([place]) 12/11/2015  . Hyperlipidemia 12/11/2015  . Lumbosacral spondylosis without myelopathy 10/11/2015  . Osteoarthritis 06/21/2015    Past Surgical History:  Procedure Laterality Date  . BACK SURGERY    . HIP SURGERY    . MOUTH SURGERY    . REPLACEMENT TOTAL KNEE Right   . TRANSURETHRAL RESECTION OF PROSTATE         History reviewed. No pertinent family history.  Social History   Tobacco Use  . Smoking status: Former Research scientist (life sciences)  . Smokeless tobacco: Never Used  Vaping Use  . Vaping Use: Never used  Substance Use Topics  . Alcohol use: No  . Drug use: No    Home Medications Prior to Admission medications   Medication Sig Start Date End Date Taking? Authorizing Provider  acetaminophen (TYLENOL) 325 MG tablet  Take 650-975 mg by mouth 3 (three) times daily as needed.    [provider]  albuterol (PROVENTIL HFA) 108 (90 Base) MCG/ACT inhaler Inhale 2 puffs into the lungs every 6 (six) hours as needed for wheezing or shortness of breath.    [provider]  aspirin 81 MG chewable tablet Chew 81 mg by mouth daily.    [provider]  brimonidine (ALPHAGAN) 0.2 % ophthalmic solution Place 1 drop into both eyes 2 (two) times daily.    [provider]  budesonide-formoterol (SYMBICORT) 160-4.5 MCG/ACT inhaler Inhale 2 puffs into the lungs 2 (two) times daily.    [provider]  CALCIUM PO Take 1 tablet by mouth daily.    [provider]  Cholecalciferol (VITAMIN D3) 1000 units CAPS Take 1 capsule by mouth daily.    [provider]  diphenhydrAMINE (BENADRYL) 25 MG tablet Take 25 mg by mouth every 6 (six) hours as needed for sleep.    [provider]  doxycycline (ADOXA) 50 MG tablet Take 50 mg by mouth daily.    [provider]  gabapentin (NEURONTIN) 300 MG capsule Take 300 mg by mouth 3 (three) times daily.    [provider]  hydroxypropyl methylcellulose / hypromellose (ISOPTO TEARS / GONIOVISC) 2.5 % ophthalmic solution Place 1 drop into both eyes 4 (four) times daily  as needed for dry eyes.    [provider]  latanoprost (XALATAN) 0.005 % ophthalmic solution Place 1 drop into both eyes at bedtime.    [provider]  loratadine (CLARITIN) 10 MG tablet Take 10 mg by mouth daily.    [provider]  losartan (COZAAR) 25 MG tablet Take 25 mg by mouth daily.    [provider]  omeprazole (PRILOSEC) 40 MG capsule Take 40 mg by mouth daily.    [provider]  tiotropium (SPIRIVA) 18 MCG inhalation capsule Place 18 mcg into inhaler and inhale daily.    [provider]  warfarin (COUMADIN) 7.5 MG tablet Take 7.5 mg by mouth daily.    [provider]     Allergies    Atorvastatin, Ciprofloxacin, Codeine, Erythromycin, Flunisolide, Lisinopril, Loratadine, Niacin and related, Penicillins, Pravastatin, Simvastatin, Statins, Sulfa antibiotics, and Zantac [ranitidine]  Review of Systems   Review of Systems  Constitutional: Negative for fever.  Respiratory: Positive for cough, chest tightness and shortness of breath.   Cardiovascular: Positive for leg swelling. Negative for chest pain.  All other systems reviewed and are negative.   Physical Exam Updated Vital Signs BP (!) 158/103   Pulse 90   Temp 98 F (36.7 C) (Oral)   Resp (!) 26   Ht 6\' 4"  (1.93 m)   Wt 95.3 kg   SpO2 94%   BMI 25.56 kg/m   Physical Exam Vitals and nursing note reviewed.  Constitutional:      General: He is not in acute distress.    Appearance: He is well-developed and well-nourished. He is not ill-appearing or diaphoretic.  HENT:     Head: Normocephalic and atraumatic.     Right Ear: External ear normal.     Left Ear: External ear normal.     Nose: Nose normal.  Eyes:     General:        Right eye: No discharge.        Left eye: No discharge.  Cardiovascular:     Rate and Rhythm: Tachycardia present. Rhythm irregular.     Heart sounds: Normal heart sounds.     Comments: HR a littler over 100 Pulmonary:     Effort: Pulmonary effort is normal.     Breath sounds: Examination of the right-lower field reveals decreased breath sounds. Decreased breath sounds present.     Comments: Perhaps mildly decreased BS in RLL Abdominal:     Palpations: Abdomen is soft.     Tenderness: There is no abdominal tenderness.  Musculoskeletal:        General: No edema.     Cervical back: Neck supple.  Skin:    General: Skin is warm and dry.  Neurological:     Mental Status: He is alert.  Psychiatric:        Mood and Affect: Mood is not anxious.     ED Results / Procedures / Treatments   Labs (all labs ordered are listed, but only abnormal results are  displayed) Labs Reviewed  COMPREHENSIVE METABOLIC PANEL - Abnormal; Notable for the following components:      Result Value   CO2 20 (*)    Creatinine, Ser 1.49 (*)    Total Bilirubin 1.6 (*)    GFR, Estimated 45 (*)    All other components within normal limits  BRAIN NATRIURETIC PEPTIDE - Abnormal; Notable for the following components:   B Natriuretic Peptide 272.2 (*)    All other components within  normal limits  CBC WITH DIFFERENTIAL/PLATELET - Abnormal; Notable for the following components:   RBC 4.20 (*)    Hemoglobin 12.3 (*)    HCT 38.4 (*)    All other components within normal limits  D-DIMER, QUANTITATIVE (NOT AT [facility]) - Abnormal; Notable for the following components:   D-Dimer, Quant 0.60 (*)    All other components within normal limits  PROTIME-INR - Abnormal; Notable for the following components:   Prothrombin Time 16.2 (*)    INR 1.4 (*)    All other components within normal limits  TROPONIN I (HIGH SENSITIVITY) - Abnormal; Notable for the following components:   Troponin I (High Sensitivity) 29 (*)    All other components within normal limits  RESP PANEL BY RT-PCR (FLU A&B, COVID) [id number]    EKG EKG Interpretation  Date/Time:  Friday January 02 2020 12:10:31 EST Ventricular Rate:  104 PR Interval:    QRS Duration: 82 QT Interval:  326 QTC Calculation: 428 R Axis:   -16 Text Interpretation: Atrial fibrillation overall similar to Sept 2021 Confirmed by Sherwood Gambler [phone]) on 01/02/2020 12:30:42 PM   Radiology DG Chest 2 View  Result Date: 01/02/2020 CLINICAL DATA:  Cough, dyspnea. EXAM: CHEST - 2 VIEW COMPARISON:  October 09, 2019. FINDINGS: Stable cardiomegaly. No pneumothorax or pleural effusion is noted. Elevated left hemidiaphragm is noted. Lungs are clear. Bony thorax is unremarkable. IMPRESSION: No active cardiopulmonary disease. Electronically Signed   By: Marijo Conception M.D.   On: 01/02/2020 13:09    Procedures Procedures (including  critical care time)  Medications Ordered in ED Medications  albuterol (VENTOLIN HFA) 108 (90 Base) MCG/ACT inhaler 2 puff (2 puffs Inhalation Given 01/02/20 1348)  furosemide (LASIX) injection 20 mg (20 mg Intravenous Given 01/02/20 1450)  acetaminophen (TYLENOL) tablet 650 mg (650 mg Oral Given 01/02/20 1450)    ED Course  I have reviewed the triage vital signs and the nursing notes.  Pertinent labs & imaging results that were available during my care of the patient were reviewed by me and considered in my medical decision making (see chart for details).    MDM Rules/Calculators/A&P                          Overall I suspect the patient's mild dyspnea is from CHF.  Mild decreased breath sounds at the bases combined with middle-of-the-road BNP and some subjective swelling in his legs makes me think that.  My suspicion for PE is low but D-dimer was sent and given it is negative via age-adjusted cut off I think PE is unlikely.  Troponin is stable compared to prior.  Has had a couple days of symptoms so I do not think repeat is needed.  Otherwise, he is now telling me that he has been generally weak whenever he walks for months and would like this evaluated.  On exam he has 5/5 strength.  No tenderness in his legs.  I discussed he needs a follow-up with his PCP for this where he states he is also due for PT which I think will be helpful.  Otherwise, from a respiratory standpoint he appears well enough for discharge.  It turns out he is on Lasix, is just not in his medicine list here.  He is not actually on warfarin so he does not have a subtherapeutic INR (though he is about to restart per him).  Will discharge home.  CHA2DS2/VAS Stroke Risk Points  Current  as of 2 minutes ago     5 >= 2 Points: High Risk  1 - 1.99 Points: Medium Risk  0 Points: Low Risk    No Change      Details    This score determines the patient's risk of having a stroke if the  patient has atrial fibrillation.        Points Metrics  1 Has Congestive Heart Failure:  Yes    Current as of 2 minutes ago  0 Has Vascular Disease:  No    Current as of 2 minutes ago  1 Has Hypertension:  Yes    Current as of 2 minutes ago  2 Age:  13    Current as of 2 minutes ago  1 Has Diabetes:  Yes    Current as of 2 minutes ago  0 Had Stroke:  No  Had TIA:  No  Had Thromboembolism:  No    Current as of 2 minutes ago  0 Male:  No    Current as of 2 minutes ago          Final Clinical Impression(s) / ED Diagnoses Final diagnoses:  Acute congestive heart failure, unspecified heart failure type (HCC)  Generalized weakness    Rx / DC Orders ED Discharge Orders    None       Sherwood Gambler, MD 01/02/20 1505    Sherwood Gambler, MD 01/02/20 1507

## 2020-04-05 ENCOUNTER — Emergency Department (HOSPITAL_COMMUNITY)
Admission: EM | Admit: 2020-04-05 | Discharge: 2020-04-05 | Disposition: A | Discharge status: Home / Self Care | Payer: No Typology Code available for payment source | Attending: Emergency Medicine | Admitting: Emergency Medicine

## 2020-04-05 ENCOUNTER — Encounter (HOSPITAL_COMMUNITY): Payer: Self-pay | Admitting: Emergency Medicine

## 2020-04-05 DIAGNOSIS — Z5321 Procedure and treatment not carried out due to patient leaving prior to being seen by health care provider: Secondary | ICD-10-CM | POA: Diagnosis not present

## 2020-04-05 DIAGNOSIS — R55 Syncope and collapse: Secondary | ICD-10-CM | POA: Insufficient documentation

## 2020-04-05 LAB — BASIC METABOLIC PANEL
Anion gap: 7 (ref 5–15)
BUN: 42 mg/dL — ABNORMAL HIGH (ref 8–23)
CO2: 27 mmol/L (ref 22–32)
Calcium: 10.1 mg/dL (ref 8.9–10.3)
Chloride: 101 mmol/L (ref 98–111)
Creatinine, Ser: 2.42 mg/dL — ABNORMAL HIGH (ref 0.61–1.24)
GFR, Estimated: 25 mL/min — ABNORMAL LOW (ref >60)
Glucose, Bld: 119 mg/dL — ABNORMAL HIGH (ref 70–99)
Potassium: 4.7 mmol/L (ref 3.5–5.1)
Sodium: 135 mmol/L (ref 135–145)

## 2020-04-05 LAB — CBC
HCT: 39.8 % (ref 39.0–52.0)
Hemoglobin: 12.3 g/dL — ABNORMAL LOW (ref 13.0–17.0)
MCH: 28.5 pg (ref 26.0–34.0)
MCHC: 30.9 g/dL (ref 30.0–36.0)
MCV: 92.1 fL (ref 80.0–100.0)
Platelets: 196 10*3/uL (ref 150–400)
RBC: 4.32 MIL/uL (ref 4.22–5.81)
RDW: 14.9 % (ref 11.5–15.5)
WBC: 4.3 10*3/uL (ref 4.0–10.5)
nRBC: 0 % (ref 0.0–0.2)

## 2020-04-05 NOTE — ED Triage Notes (Signed)
Pt arrives to Ed with chief complaint of feeling faint and dizziness since yesterday am. Denies any syncope events or falls. No weakness or drift noted on exam. Pt alert and ox4 clear speech.

## 2020-04-05 NOTE — ED Notes (Signed)
Name called for room assignment with no response

## 2020-04-05 NOTE — ED Notes (Signed)
Patient called for vitals recheck x1 with no response

## 2020-04-08 ENCOUNTER — Other Ambulatory Visit: Payer: Self-pay

## 2020-04-08 ENCOUNTER — Emergency Department (HOSPITAL_BASED_OUTPATIENT_CLINIC_OR_DEPARTMENT_OTHER): Payer: No Typology Code available for payment source

## 2020-04-08 ENCOUNTER — Inpatient Hospital Stay (HOSPITAL_BASED_OUTPATIENT_CLINIC_OR_DEPARTMENT_OTHER)
Admission: EM | Admit: 2020-04-08 | Discharge: 2020-04-10 | DRG: 684 | Disposition: A | Discharge status: Home Health | Payer: No Typology Code available for payment source | Attending: Internal Medicine | Admitting: Internal Medicine

## 2020-04-08 ENCOUNTER — Encounter (HOSPITAL_BASED_OUTPATIENT_CLINIC_OR_DEPARTMENT_OTHER): Payer: Self-pay | Admitting: Emergency Medicine

## 2020-04-08 DIAGNOSIS — I129 Hypertensive chronic kidney disease with stage 1 through stage 4 chronic kidney disease, or unspecified chronic kidney disease: Secondary | ICD-10-CM | POA: Diagnosis present

## 2020-04-08 DIAGNOSIS — Z881 Allergy status to other antibiotic agents status: Secondary | ICD-10-CM

## 2020-04-08 DIAGNOSIS — Z87891 Personal history of nicotine dependence: Secondary | ICD-10-CM

## 2020-04-08 DIAGNOSIS — Z882 Allergy status to sulfonamides status: Secondary | ICD-10-CM

## 2020-04-08 DIAGNOSIS — Z880 Allergy status to penicillin: Secondary | ICD-10-CM

## 2020-04-08 DIAGNOSIS — E1122 Type 2 diabetes mellitus with diabetic chronic kidney disease: Secondary | ICD-10-CM | POA: Diagnosis present

## 2020-04-08 DIAGNOSIS — Z7951 Long term (current) use of inhaled steroids: Secondary | ICD-10-CM

## 2020-04-08 DIAGNOSIS — Z885 Allergy status to narcotic agent status: Secondary | ICD-10-CM

## 2020-04-08 DIAGNOSIS — Z20822 Contact with and (suspected) exposure to covid-19: Secondary | ICD-10-CM | POA: Diagnosis present

## 2020-04-08 DIAGNOSIS — N183 Chronic kidney disease, stage 3 unspecified: Secondary | ICD-10-CM | POA: Diagnosis present

## 2020-04-08 DIAGNOSIS — G4733 Obstructive sleep apnea (adult) (pediatric): Secondary | ICD-10-CM | POA: Diagnosis present

## 2020-04-08 DIAGNOSIS — Z888 Allergy status to other drugs, medicaments and biological substances status: Secondary | ICD-10-CM

## 2020-04-08 DIAGNOSIS — Z7901 Long term (current) use of anticoagulants: Secondary | ICD-10-CM

## 2020-04-08 DIAGNOSIS — Z79899 Other long term (current) drug therapy: Secondary | ICD-10-CM

## 2020-04-08 DIAGNOSIS — Z7982 Long term (current) use of aspirin: Secondary | ICD-10-CM

## 2020-04-08 DIAGNOSIS — N179 Acute kidney failure, unspecified: Secondary | ICD-10-CM | POA: Diagnosis not present

## 2020-04-08 DIAGNOSIS — I951 Orthostatic hypotension: Secondary | ICD-10-CM | POA: Diagnosis not present

## 2020-04-08 DIAGNOSIS — I480 Paroxysmal atrial fibrillation: Secondary | ICD-10-CM | POA: Diagnosis present

## 2020-04-08 DIAGNOSIS — E119 Type 2 diabetes mellitus without complications: Secondary | ICD-10-CM

## 2020-04-08 DIAGNOSIS — J449 Chronic obstructive pulmonary disease, unspecified: Secondary | ICD-10-CM | POA: Diagnosis present

## 2020-04-08 DIAGNOSIS — R55 Syncope and collapse: Secondary | ICD-10-CM

## 2020-04-08 DIAGNOSIS — E785 Hyperlipidemia, unspecified: Secondary | ICD-10-CM | POA: Diagnosis present

## 2020-04-08 LAB — BASIC METABOLIC PANEL
Anion gap: 11 (ref 5–15)
BUN: 55 mg/dL — ABNORMAL HIGH (ref 8–23)
CO2: 24 mmol/L (ref 22–32)
Calcium: 9.6 mg/dL (ref 8.9–10.3)
Chloride: 101 mmol/L (ref 98–111)
Creatinine, Ser: 2.53 mg/dL — ABNORMAL HIGH (ref 0.61–1.24)
GFR, Estimated: 24 mL/min — ABNORMAL LOW (ref >60)
Glucose, Bld: 106 mg/dL — ABNORMAL HIGH (ref 70–99)
Potassium: 4.4 mmol/L (ref 3.5–5.1)
Sodium: 136 mmol/L (ref 135–145)

## 2020-04-08 LAB — BRAIN NATRIURETIC PEPTIDE: B Natriuretic Peptide: 190.5 pg/mL — ABNORMAL HIGH (ref 0.0–100.0)

## 2020-04-08 LAB — URINALYSIS, ROUTINE W REFLEX MICROSCOPIC
Bilirubin Urine: NEGATIVE
Glucose, UA: NEGATIVE mg/dL
Hgb urine dipstick: NEGATIVE
Ketones, ur: NEGATIVE mg/dL
Nitrite: NEGATIVE
Protein, ur: NEGATIVE mg/dL
Specific Gravity, Urine: 1.02 (ref 1.005–1.030)
pH: 5.5 (ref 5.0–8.0)

## 2020-04-08 LAB — URINALYSIS, MICROSCOPIC (REFLEX): RBC / HPF: NONE SEEN RBC/hpf (ref 0–5)

## 2020-04-08 LAB — TROPONIN I (HIGH SENSITIVITY)
Troponin I (High Sensitivity): 24 ng/L — ABNORMAL HIGH (ref <18)
Troponin I (High Sensitivity): 24 ng/L — ABNORMAL HIGH (ref <18)

## 2020-04-08 LAB — CBC
HCT: 38.7 % — ABNORMAL LOW (ref 39.0–52.0)
Hemoglobin: 12.1 g/dL — ABNORMAL LOW (ref 13.0–17.0)
MCH: 28.2 pg (ref 26.0–34.0)
MCHC: 31.3 g/dL (ref 30.0–36.0)
MCV: 90.2 fL (ref 80.0–100.0)
Platelets: 194 10*3/uL (ref 150–400)
RBC: 4.29 MIL/uL (ref 4.22–5.81)
RDW: 15 % (ref 11.5–15.5)
WBC: 4.9 10*3/uL (ref 4.0–10.5)
nRBC: 0 % (ref 0.0–0.2)

## 2020-04-08 LAB — PROTIME-INR
INR: 1.5 — ABNORMAL HIGH (ref 0.8–1.2)
Prothrombin Time: 17.8 seconds — ABNORMAL HIGH (ref 11.4–15.2)

## 2020-04-08 LAB — CBG MONITORING, ED: Glucose-Capillary: 99 mg/dL (ref 70–99)

## 2020-04-08 MED ORDER — SODIUM CHLORIDE 0.9 % IV SOLN: Freq: Once | INTRAVENOUS | Status: AC

## 2020-04-08 MED ORDER — SODIUM CHLORIDE 0.9 % IV BOLUS
1000.0000 mL | Freq: Once | INTRAVENOUS | Status: AC
  Administered 2020-04-08: 1000 mL via INTRAVENOUS

## 2020-04-08 NOTE — ED Notes (Signed)
While ambulating patient his Spo2 stayed between 92-95%

## 2020-04-08 NOTE — ED Triage Notes (Signed)
Continues to c/o feeling faint and dizziness since 04/04/20.  Denies any syncope or falls.  Endorses felling weak all over.  Was at [facility] but didn't wait to be seen on 03/21.

## 2020-04-08 NOTE — ED Provider Notes (Signed)
[facility] EMERGENCY DEPARTMENT Provider Note   CSN: [id number] Arrival date & time: 04/08/20  1446     History Chief Complaint  Patient presents with  . Hypotension    CAYCE HULL is a 87 y.o. male.  He is brought in by his wife for evaluation of 3 weeks or so feeling weak all over, dizzy and lightheadedness.  His blood pressure has been low, getting readings at home in the 70s.He said he is also had some on-and-off chest pain none now.  Wife states they have been to a couple of hospitals for this and had his blood pressure and Lasix halfed.  He feels he is urinating less.  No nausea vomiting diarrhea.  No headache blurry vision.  No focal numbness or weakness.  The history is provided by the patient and the spouse.  Dizziness Quality:  Lightheadedness Severity:  Moderate Onset quality:  Gradual Timing:  Intermittent Progression:  Unchanged Chronicity:  New Context: standing up   Relieved by:  Nothing Worsened by:  Standing up Ineffective treatments:  None tried Associated symptoms: chest pain   Associated symptoms: no blood in stool, no diarrhea, no headaches, no nausea, no shortness of breath, no vision changes and no vomiting        Past Medical History:  Diagnosis Date  . Atrial fibrillation ([place])   . Back pain   . COPD (chronic obstructive pulmonary disease) ([place])   . Diabetes mellitus without complication ([place])   . High cholesterol   . Hypertension   . Knee pain     Patient Active Problem List   Diagnosis Date Noted  . Near syncope 12/11/2015  . Atrial fibrillation ([place]) 12/11/2015  . COPD (chronic obstructive pulmonary disease) ([place]) 12/11/2015  . OSA (obstructive sleep apnea) 12/11/2015  . Hypertension 12/11/2015  . Type 2 diabetes mellitus ([place]) 12/11/2015  . Hyperlipidemia 12/11/2015  . Lumbosacral spondylosis without myelopathy 10/11/2015  . Osteoarthritis 06/21/2015    Past Surgical History:  Procedure Laterality Date  . BACK SURGERY     . HIP SURGERY    . MOUTH SURGERY    . REPLACEMENT TOTAL KNEE Right   . TRANSURETHRAL RESECTION OF PROSTATE         No family history on file.  Social History   Tobacco Use  . Smoking status: Former Research scientist (life sciences)  . Smokeless tobacco: Never Used  Vaping Use  . Vaping Use: Never used  Substance Use Topics  . Alcohol use: No  . Drug use: No    Home Medications Prior to Admission medications   Medication Sig Start Date End Date Taking? Authorizing Provider  acetaminophen (TYLENOL) 325 MG tablet Take 650-975 mg by mouth 3 (three) times daily as needed.    [provider]  albuterol (PROVENTIL HFA) 108 (90 Base) MCG/ACT inhaler Inhale 2 puffs into the lungs every 6 (six) hours as needed for wheezing or shortness of breath.    [provider]  aspirin 81 MG chewable tablet Chew 81 mg by mouth daily.    [provider]  brimonidine (ALPHAGAN) 0.2 % ophthalmic solution Place 1 drop into both eyes 2 (two) times daily.    [provider]  budesonide-formoterol (SYMBICORT) 160-4.5 MCG/ACT inhaler Inhale 2 puffs into the lungs 2 (two) times daily.    [provider]  CALCIUM PO Take 1 tablet by mouth daily.    [provider]  Cholecalciferol (VITAMIN D3) 1000 units CAPS Take 1 capsule by mouth daily.  [provider]  diphenhydrAMINE (BENADRYL) 25 MG tablet Take 25 mg by mouth every 6 (six) hours as needed for sleep.    [provider]  doxycycline (ADOXA) 50 MG tablet Take 50 mg by mouth daily.    [provider]  gabapentin (NEURONTIN) 300 MG capsule Take 300 mg by mouth 3 (three) times daily.    [provider]  hydroxypropyl methylcellulose / hypromellose (ISOPTO TEARS / GONIOVISC) 2.5 % ophthalmic solution Place 1 drop into both eyes 4 (four) times daily as needed for dry eyes.    [provider]  latanoprost (XALATAN) 0.005 % ophthalmic solution Place 1 drop into both eyes at bedtime.     [provider]  loratadine (CLARITIN) 10 MG tablet Take 10 mg by mouth daily.    [provider]  losartan (COZAAR) 25 MG tablet Take 25 mg by mouth daily.    [provider]  omeprazole (PRILOSEC) 40 MG capsule Take 40 mg by mouth daily.    [provider]  tiotropium (SPIRIVA) 18 MCG inhalation capsule Place 18 mcg into inhaler and inhale daily.    [provider]  warfarin (COUMADIN) 7.5 MG tablet Take 7.5 mg by mouth daily.    [provider]    Allergies    Atorvastatin, Ciprofloxacin, Codeine, Erythromycin, Flunisolide, Lisinopril, Loratadine, Niacin and related, Penicillins, Pravastatin, Simvastatin, Statins, Sulfa antibiotics, and Zantac [ranitidine]  Review of Systems   Review of Systems  Constitutional: Positive for fatigue. Negative for fever.  HENT: Negative for sore throat.   Eyes: Negative for visual disturbance.  Respiratory: Negative for shortness of breath.   Cardiovascular: Positive for chest pain.  Gastrointestinal: Negative for abdominal pain, blood in stool, diarrhea, nausea and vomiting.  Genitourinary: Negative for dysuria.  Musculoskeletal: Positive for back pain (chronic).  Skin: Negative for rash.  Neurological: Positive for dizziness and light-headedness. Negative for speech difficulty and headaches.    Physical Exam Updated Vital Signs BP 117/67 (BP Location: Left Arm)   Pulse 66   Temp 97.8 F (36.6 C) (Oral)   Resp 18   Ht '6\' 4"'$  (1.93 m)   Wt 95.3 kg   SpO2 99%   BMI 25.57 kg/m   Physical Exam Vitals and nursing note reviewed.  Constitutional:      Appearance: Normal appearance. He is well-developed.  HENT:     Head: Normocephalic and atraumatic.  Eyes:     Conjunctiva/sclera: Conjunctivae normal.  Cardiovascular:     Rate and Rhythm: Normal rate. Rhythm irregular.     Pulses: Normal pulses.     Heart sounds: No murmur heard.   Pulmonary:     Effort: Pulmonary effort is normal. No  respiratory distress.     Breath sounds: Normal breath sounds.  Abdominal:     Palpations: Abdomen is soft.     Tenderness: There is no abdominal tenderness.  Musculoskeletal:        General: No deformity or signs of injury. Normal range of motion.     Cervical back: Neck supple.     Right lower leg: No edema.     Left lower leg: No edema.  Skin:    General: Skin is warm and dry.     Capillary Refill: Capillary refill takes less than 2 seconds.  Neurological:     General: No focal deficit present.     Mental Status: He is alert and oriented to person, place, and time.     Cranial Nerves: No cranial  nerve deficit.     Sensory: No sensory deficit.     Motor: No weakness.     ED Results / Procedures / Treatments   Labs (all labs ordered are listed, but only abnormal results are displayed) Labs Reviewed  BASIC METABOLIC PANEL - Abnormal; Notable for the following components:      Result Value   Glucose, Bld 106 (*)    BUN 55 (*)    Creatinine, Ser 2.53 (*)    GFR, Estimated 24 (*)    All other components within normal limits  CBC - Abnormal; Notable for the following components:   Hemoglobin 12.1 (*)    HCT 38.7 (*)    All other components within normal limits  URINALYSIS, ROUTINE W REFLEX MICROSCOPIC - Abnormal; Notable for the following components:   Leukocytes,Ua TRACE (*)    All other components within normal limits  PROTIME-INR - Abnormal; Notable for the following components:   Prothrombin Time 17.8 (*)    INR 1.5 (*)    All other components within normal limits  BRAIN NATRIURETIC PEPTIDE - Abnormal; Notable for the following components:   B Natriuretic Peptide 190.5 (*)    All other components within normal limits  URINALYSIS, MICROSCOPIC (REFLEX) - Abnormal; Notable for the following components:   Bacteria, UA MANY (*)    All other components within normal limits  BASIC METABOLIC PANEL - Abnormal; Notable for the following components:   Glucose, Bld 101 (*)     BUN 43 (*)    Creatinine, Ser 2.26 (*)    GFR, Estimated 27 (*)    All other components within normal limits  CBC - Abnormal; Notable for the following components:   RBC 4.08 (*)    Hemoglobin 11.6 (*)    HCT 37.5 (*)    All other components within normal limits  HEMOGLOBIN A1C - Abnormal; Notable for the following components:   Hgb A1c MFr Bld 6.4 (*)    All other components within normal limits  TROPONIN I (HIGH SENSITIVITY) - Abnormal; Notable for the following components:   Troponin I (High Sensitivity) 24 (*)    All other components within normal limits  TROPONIN I (HIGH SENSITIVITY) - Abnormal; Notable for the following components:   Troponin I (High Sensitivity) 24 (*)    All other components within normal limits  SARS CORONAVIRUS 2 (TAT 6-24 HRS)  GLUCOSE, CAPILLARY  CBG MONITORING, ED    EKG EKG Interpretation  Date/Time:  Thursday April 08 2020 14:58:18 EDT Ventricular Rate:  65 PR Interval:    QRS Duration: 96 QT Interval:  402 QTC Calculation: 418 R Axis:   -43 Text Interpretation: Atrial flutter with variable A-V block with premature ventricular or aberrantly conducted complexes Left axis deviation Abnormal ECG No significant change since prior 3/22 Confirmed by Aletta Edouard [phone]) on 04/08/2020 3:00:23 PM   Radiology DG Chest Port 1 View  Result Date: 04/08/2020 CLINICAL DATA:  Dizziness, weakness EXAM: PORTABLE CHEST 1 VIEW COMPARISON:  01/02/2020 FINDINGS: Single frontal view of the chest demonstrates marked enlargement the cardiac silhouette. No acute airspace disease, effusion, or pneumothorax. Mild chronic central vascular congestion. Stable degenerative changes of the right shoulder. No acute bony abnormalities. IMPRESSION: 1. Mild chronic central vascular congestion without overt edema. 2. Stable enlarged cardiac silhouette. Electronically Signed   By: Randa Ngo M.D.   On: 04/08/2020 16:31    Procedures Procedures   Medications Ordered in  ED Medications  amiodarone (PACERONE) tablet 200 mg (has  no administration in time range)  amLODipine (NORVASC) tablet 10 mg (has no administration in time range)  apixaban (ELIQUIS) tablet 5 mg (5 mg Oral Given 04/09/20 0327)  albuterol (VENTOLIN HFA) 108 (90 Base) MCG/ACT inhaler 2 puff (has no administration in time range)  fluticasone furoate-vilanterol (BREO ELLIPTA) 200-25 MCG/INH 1 puff (1 puff Inhalation Given 04/09/20 0829)  latanoprost (XALATAN) 0.005 % ophthalmic solution 1 drop (has no administration in time range)  lactated ringers infusion ( Intravenous New Bag/Given 04/09/20 0329)  acetaminophen (TYLENOL) tablet 650 mg (has no administration in time range)    Or  acetaminophen (TYLENOL) suppository 650 mg (has no administration in time range)  insulin aspart (novoLOG) injection 0-15 Units (0 Units Subcutaneous Not Given 04/09/20 0824)  insulin aspart (novoLOG) injection 0-5 Units (has no administration in time range)  umeclidinium bromide (INCRUSE ELLIPTA) 62.5 MCG/INH 1 puff (1 puff Inhalation Given 04/09/20 0829)  sodium chloride 0.9 % bolus 1,000 mL (0 mLs Intravenous Stopped 04/08/20 1738)  0.9 %  sodium chloride infusion ( Intravenous New Bag/Given 04/08/20 1936)    ED Course  I have reviewed the triage vital signs and the nursing notes.  Pertinent labs & imaging results that were available during my care of the patient were reviewed by me and considered in my medical decision making (see chart for details).  Clinical Course as of 04/09/20 1028  Thu Apr 08, 2020  1809 I am able to partially review patient's notes from the [place].  He was there 3 days ago with similar complaints.  They drew some labs most of which were unremarkable although his chemistry had not resulted at the time of discharge.  They decreased his losartan and his Lasix to half dose.  He was at [facility] 2 days ago but did not stay for an evaluation.  He had labs done in triage which showed his creatinine to be  elevated to 2.4 from a baseline of 1.6.  Similar creatinine today.  Hemoglobin stable.  INR slightly subtherapeutic at 1.5.  Troponins mildly elevated and will need to be trended but are similar to priors. [MB]  [id number] Patient did fairly well with orthostatics and ambulation in the department.  He said he only felt a little dizzy.  His wife is very scared as he has been having blood pressures in the 70s at home when he had a syncopal event while he was at his [facility] appointment yesterday.  His blood pressures were in the 70s this morning.  She does not feel comfortable with him going home. [MB]  1926 Discussed with Dr. Myna Hidalgo [facility] hospitalist who will put the patient for him for a bed at [facility].  Recommended holding his losartan.  IV fluids.  [MB]    Clinical Course User Index [MB] Hayden Rasmussen, MD   MDM Rules/Calculators/A&P                         This patient complains of generalized weakness low blood pressures presyncope and syncope; this involves an extensive number of treatment Options and is a complaint that carries with it a high risk of complications and Morbidity. The differential includes arrhythmia, anemia, metabolic derangement, dehydration, overmedication  I ordered, reviewed and interpreted labs, which included CBC with normal white count hemoglobin slightly low, chemistry showing elevated BUN and creatinine up from baseline, urinalysis without signs of infection, troponins mildly elevated but flat I ordered medication IV fluids I ordered imaging studies which  included chest x-ray and I independently    visualized and interpreted imaging which showed no acute disease Additional history obtained from patient's wife Previous records obtained and reviewed in epic including prior [place] notes I consulted Dr. Myna Hidalgo [facility] hospitalist and discussed lab and imaging findings  Critical Interventions: None  After the interventions stated above, I reevaluated the patient and found  patient to be hemodynamically stable.  Reviewed current work-up with him and his wife.  She is uncomfortable taking him home as she has seen multiple episodes of his blood pressure being in the 70s.  Discussed with [facility] Dr. Myna Hidalgo who agrees to admit him for observation, IV fluids, monitoring.   Final Clinical Impression(s) / ED Diagnoses Final diagnoses:  AKI (acute kidney injury) ([place])  Syncope, unspecified syncope type  Orthostatic hypotension    Rx / DC Orders ED Discharge Orders    None       Hayden Rasmussen, MD 04/09/20 1028

## 2020-04-08 NOTE — ED Notes (Signed)
Fluids infusing, pt given remote to change TV channel. Pt had eaten food.

## 2020-04-08 NOTE — ED Notes (Signed)
Attempted to call report, RN is in a room and she will call back-- per [employer].

## 2020-04-09 ENCOUNTER — Encounter (HOSPITAL_COMMUNITY): Payer: Self-pay | Admitting: Family Medicine

## 2020-04-09 ENCOUNTER — Inpatient Hospital Stay (HOSPITAL_COMMUNITY): Payer: No Typology Code available for payment source

## 2020-04-09 DIAGNOSIS — Z87891 Personal history of nicotine dependence: Secondary | ICD-10-CM | POA: Diagnosis not present

## 2020-04-09 DIAGNOSIS — I480 Paroxysmal atrial fibrillation: Secondary | ICD-10-CM | POA: Diagnosis present

## 2020-04-09 DIAGNOSIS — N183 Chronic kidney disease, stage 3 unspecified: Secondary | ICD-10-CM

## 2020-04-09 DIAGNOSIS — Z882 Allergy status to sulfonamides status: Secondary | ICD-10-CM | POA: Diagnosis not present

## 2020-04-09 DIAGNOSIS — J449 Chronic obstructive pulmonary disease, unspecified: Secondary | ICD-10-CM | POA: Diagnosis present

## 2020-04-09 DIAGNOSIS — N179 Acute kidney failure, unspecified: Principal | ICD-10-CM

## 2020-04-09 DIAGNOSIS — Z7982 Long term (current) use of aspirin: Secondary | ICD-10-CM | POA: Diagnosis not present

## 2020-04-09 DIAGNOSIS — I129 Hypertensive chronic kidney disease with stage 1 through stage 4 chronic kidney disease, or unspecified chronic kidney disease: Secondary | ICD-10-CM | POA: Diagnosis present

## 2020-04-09 DIAGNOSIS — Z7901 Long term (current) use of anticoagulants: Secondary | ICD-10-CM | POA: Diagnosis not present

## 2020-04-09 DIAGNOSIS — G4733 Obstructive sleep apnea (adult) (pediatric): Secondary | ICD-10-CM | POA: Diagnosis present

## 2020-04-09 DIAGNOSIS — Z881 Allergy status to other antibiotic agents status: Secondary | ICD-10-CM | POA: Diagnosis not present

## 2020-04-09 DIAGNOSIS — E785 Hyperlipidemia, unspecified: Secondary | ICD-10-CM | POA: Diagnosis present

## 2020-04-09 DIAGNOSIS — Z888 Allergy status to other drugs, medicaments and biological substances status: Secondary | ICD-10-CM | POA: Diagnosis not present

## 2020-04-09 DIAGNOSIS — I951 Orthostatic hypotension: Secondary | ICD-10-CM

## 2020-04-09 DIAGNOSIS — Z20822 Contact with and (suspected) exposure to covid-19: Secondary | ICD-10-CM | POA: Diagnosis present

## 2020-04-09 DIAGNOSIS — Z79899 Other long term (current) drug therapy: Secondary | ICD-10-CM | POA: Diagnosis not present

## 2020-04-09 DIAGNOSIS — Z7951 Long term (current) use of inhaled steroids: Secondary | ICD-10-CM | POA: Diagnosis not present

## 2020-04-09 DIAGNOSIS — Z885 Allergy status to narcotic agent status: Secondary | ICD-10-CM | POA: Diagnosis not present

## 2020-04-09 DIAGNOSIS — E1122 Type 2 diabetes mellitus with diabetic chronic kidney disease: Secondary | ICD-10-CM | POA: Diagnosis present

## 2020-04-09 DIAGNOSIS — Z880 Allergy status to penicillin: Secondary | ICD-10-CM | POA: Diagnosis not present

## 2020-04-09 LAB — GLUCOSE, CAPILLARY
Glucose-Capillary: 92 mg/dL (ref 70–99)
Glucose-Capillary: 131 mg/dL — ABNORMAL HIGH (ref 70–99)
Glucose-Capillary: 87 mg/dL (ref 70–99)
Glucose-Capillary: 105 mg/dL — ABNORMAL HIGH (ref 70–99)

## 2020-04-09 LAB — CBC
HCT: 37.5 % — ABNORMAL LOW (ref 39.0–52.0)
Hemoglobin: 11.6 g/dL — ABNORMAL LOW (ref 13.0–17.0)
MCH: 28.4 pg (ref 26.0–34.0)
MCHC: 30.9 g/dL (ref 30.0–36.0)
MCV: 91.9 fL (ref 80.0–100.0)
Platelets: 178 10*3/uL (ref 150–400)
RBC: 4.08 MIL/uL — ABNORMAL LOW (ref 4.22–5.81)
RDW: 15 % (ref 11.5–15.5)
WBC: 4.2 10*3/uL (ref 4.0–10.5)
nRBC: 0 % (ref 0.0–0.2)

## 2020-04-09 LAB — BASIC METABOLIC PANEL
Anion gap: 8 (ref 5–15)
BUN: 43 mg/dL — ABNORMAL HIGH (ref 8–23)
CO2: 22 mmol/L (ref 22–32)
Calcium: 9.4 mg/dL (ref 8.9–10.3)
Chloride: 111 mmol/L (ref 98–111)
Creatinine, Ser: 2.26 mg/dL — ABNORMAL HIGH (ref 0.61–1.24)
GFR, Estimated: 27 mL/min — ABNORMAL LOW (ref >60)
Glucose, Bld: 101 mg/dL — ABNORMAL HIGH (ref 70–99)
Potassium: 4.1 mmol/L (ref 3.5–5.1)
Sodium: 141 mmol/L (ref 135–145)

## 2020-04-09 LAB — SARS CORONAVIRUS 2 (TAT 6-24 HRS): SARS Coronavirus 2: NEGATIVE

## 2020-04-09 LAB — HEMOGLOBIN A1C
Hgb A1c MFr Bld: 6.4 % — ABNORMAL HIGH (ref 4.8–5.6)
Mean Plasma Glucose: 136.98 mg/dL

## 2020-04-09 MED ORDER — LACTATED RINGERS IV SOLN: INTRAVENOUS | Status: DC

## 2020-04-09 MED ORDER — INSULIN ASPART 100 UNIT/ML ~~LOC~~ SOLN: 0.0000 [IU] | Freq: Every day | SUBCUTANEOUS | Status: DC

## 2020-04-09 MED ORDER — ACETAMINOPHEN 325 MG PO TABS
650.0000 mg | ORAL_TABLET | Freq: Four times a day (QID) | ORAL | Status: DC | PRN
  Administered 2020-04-09 – 2020-04-10 (×2): 650 mg via ORAL
  Filled 2020-04-09 (×2): qty 2

## 2020-04-09 MED ORDER — FLUTICASONE FUROATE-VILANTEROL 200-25 MCG/INH IN AEPB
1.0000 | INHALATION_SPRAY | Freq: Every day | RESPIRATORY_TRACT | Status: DC
  Administered 2020-04-09 – 2020-04-10 (×2): 1 via RESPIRATORY_TRACT
  Filled 2020-04-09: qty 28

## 2020-04-09 MED ORDER — ALBUTEROL SULFATE HFA 108 (90 BASE) MCG/ACT IN AERS: 2.0000 | INHALATION_SPRAY | Freq: Four times a day (QID) | RESPIRATORY_TRACT | Status: DC | PRN

## 2020-04-09 MED ORDER — LATANOPROST 0.005 % OP SOLN
1.0000 [drp] | Freq: Every day | OPHTHALMIC | Status: DC
  Administered 2020-04-09: 1 [drp] via OPHTHALMIC
  Filled 2020-04-09: qty 2.5

## 2020-04-09 MED ORDER — ACETAMINOPHEN 650 MG RE SUPP: 650.0000 mg | Freq: Four times a day (QID) | RECTAL | Status: DC | PRN

## 2020-04-09 MED ORDER — INSULIN ASPART 100 UNIT/ML ~~LOC~~ SOLN
0.0000 [IU] | Freq: Three times a day (TID) | SUBCUTANEOUS | Status: DC
  Administered 2020-04-09 – 2020-04-10 (×2): 2 [IU] via SUBCUTANEOUS

## 2020-04-09 MED ORDER — AMLODIPINE BESYLATE 10 MG PO TABS
10.0000 mg | ORAL_TABLET | Freq: Every day | ORAL | Status: DC
  Administered 2020-04-09 – 2020-04-10 (×2): 10 mg via ORAL
  Filled 2020-04-09 (×2): qty 1

## 2020-04-09 MED ORDER — APIXABAN 5 MG PO TABS
5.0000 mg | ORAL_TABLET | Freq: Two times a day (BID) | ORAL | Status: DC
  Administered 2020-04-09 (×2): 5 mg via ORAL
  Filled 2020-04-09 (×2): qty 1

## 2020-04-09 MED ORDER — TIOTROPIUM BROMIDE MONOHYDRATE 18 MCG IN CAPS: 18.0000 ug | ORAL_CAPSULE | Freq: Every day | RESPIRATORY_TRACT | Status: DC

## 2020-04-09 MED ORDER — APIXABAN 2.5 MG PO TABS
2.5000 mg | ORAL_TABLET | Freq: Two times a day (BID) | ORAL | Status: DC
  Administered 2020-04-09 – 2020-04-10 (×2): 2.5 mg via ORAL
  Filled 2020-04-09 (×2): qty 1

## 2020-04-09 MED ORDER — UMECLIDINIUM BROMIDE 62.5 MCG/INH IN AEPB
1.0000 | INHALATION_SPRAY | Freq: Every day | RESPIRATORY_TRACT | Status: DC
  Administered 2020-04-09 – 2020-04-10 (×2): 1 via RESPIRATORY_TRACT
  Filled 2020-04-09: qty 7

## 2020-04-09 MED ORDER — AMIODARONE HCL 200 MG PO TABS
200.0000 mg | ORAL_TABLET | Freq: Every day | ORAL | Status: DC
  Administered 2020-04-09 – 2020-04-10 (×2): 200 mg via ORAL
  Filled 2020-04-09 (×2): qty 1

## 2020-04-09 NOTE — H&P (Signed)
History and Physical    Jordan Hoffman [id number] DOB: 1928/12/02 DOA: 04/08/2020  PCP: [facility] [place]   Patient coming from:  Home via [facility] ER  Chief Complaint: weakness and not feeling well. Low blood pressure  HPI: Jordan Hoffman is a 87 y.o. male with medical history significant for HTN, DMT2, A fib, COPD, CKD 3, HLD who presents for evaluation of his weakness and low blood pressure. Jordan Hoffman was brought to the ER by his wife for evaluation of 3 weeks or so feeling weak all over, dizzy with lightheadedness.  His blood pressure has been low, getting readings at home in the 70s. He said he is also had some on-and-off chest pain.  He reports that he was seen at the [place] a few days ago and had a work-up in their ER and told everything was fine and he was sent home.  He states he was monitored on some machines and then was told all his tests were good so he was sent home. He feels he is urinating less.  No nausea vomiting diarrhea.  No headache blurry vision.  No focal numbness or weakness.   Review of Systems:  General: Reports generalized weakness. Denies  fever, chills, weight loss, night sweats.  Denies dizziness.  Denies change in appetite HENT: Denies head trauma, headache, denies change in hearing, tinnitus.  Denies nasal bleeding. Denies sore throat, sores in mouth.  Denies difficulty swallowing Eyes: Denies blurry vision, pain in eye, drainage.  Denies discoloration of eyes. Neck: Denies pain.  Denies swelling.  Denies pain with movement. Cardiovascular: Denies chest pain, palpitations. Denies edema.  Denies orthopnea Respiratory: Denies shortness of breath, cough. Denies wheezing.  Denies sputum production Gastrointestinal: Denies abdominal pain, swelling.  Denies nausea, vomiting, diarrhea.  Denies melena.  Denies hematemesis. Musculoskeletal: Denies limitation of movement.  Denies deformity or swelling.  Denies pain.   Genitourinary: Denies pelvic pain.  Denies urinary  frequency or hesitancy.  Denies dysuria.  Skin: Denies rash.  Denies petechiae, purpura, ecchymosis. Neurological: Denies headache.  Denies syncope.  Denies seizure activity.  Denies paresthesia.  Denies slurred speech, drooping face.  Denies visual change. Psychiatric: Denies depression, anxiety. Denies hallucinations.  Past Medical History:  Diagnosis Date  . Atrial fibrillation ([place])   . Back pain   . COPD (chronic obstructive pulmonary disease) ([place])   . Diabetes mellitus without complication ([place])   . High cholesterol   . Hypertension   . Knee pain     Past Surgical History:  Procedure Laterality Date  . BACK SURGERY    . HIP SURGERY    . MOUTH SURGERY    . REPLACEMENT TOTAL KNEE Right   . TRANSURETHRAL RESECTION OF PROSTATE      Social History  reports that he has quit smoking. He has never used smokeless tobacco. He reports that he does not drink alcohol and does not use drugs.  Allergies  Allergen Reactions  . Atorvastatin Other (See Comments)    Muscle pain   . Ciprofloxacin     Hives   . Codeine     Hives   . Erythromycin     Hives   . Flunisolide Other (See Comments)    Hives   . Lisinopril     Cough   . Loratadine     Hives   . Niacin And Related     Hives   . Penicillins Hives    Has patient had a PCN reaction causing immediate  rash, facial/tongue/throat swelling, SOB or lightheadedness with hypotension: YES Has patient had a PCN reaction causing severe rash involving mucus membranes or skin necrosis: NO Has patient had a PCN reaction that required hospitalization NO Has patient had a PCN reaction occurring within the last 10 years: NO If all of the above answers are "NO", then may proceed with Cephalosporin use.  . Pravastatin Other (See Comments)    Hives   . Simvastatin Other (See Comments)    Hives   . Statins     Hives   . Sulfa Antibiotics     Hives   . Zantac [Ranitidine]     Hives     History reviewed. No pertinent family  history.   Prior to Admission medications   Medication Sig Start Date End Date Taking? Authorizing Provider  acetaminophen (TYLENOL) 325 MG tablet Take 650-975 mg by mouth 3 (three) times daily as needed.   Yes [provider]  albuterol (VENTOLIN HFA) 108 (90 Base) MCG/ACT inhaler Inhale 2 puffs into the lungs every 6 (six) hours as needed for wheezing or shortness of breath.   Yes [provider]  amiodarone (PACERONE) 200 MG tablet Take 1 tablet by mouth daily. 02/11/20  Yes [provider]  amLODipine (NORVASC) 10 MG tablet Take 10 mg by mouth daily.   Yes [provider]  budesonide-formoterol (SYMBICORT) 160-4.5 MCG/ACT inhaler Inhale 2 puffs into the lungs 2 (two) times daily.   Yes [provider]  CALCIUM PO Take 1 tablet by mouth daily.   Yes [provider]  Cholecalciferol (VITAMIN D3) 1000 units CAPS Take 1 capsule by mouth daily.   Yes [provider]  diphenhydrAMINE (BENADRYL) 25 MG tablet Take 25 mg by mouth every 6 (six) hours as needed for sleep.   Yes [provider]  furosemide (LASIX) 40 MG tablet Take 40 mg by mouth daily at 6 (six) AM. 01/06/20  Yes [provider]  gabapentin (NEURONTIN) 300 MG capsule Take 300 mg by mouth 2 (two) times daily.   Yes [provider]  glipiZIDE (GLUCOTROL) 5 MG tablet Take 0.5 tablets by mouth See admin instructions. Every evening 30 minutes before dinner 05/18/19  Yes [provider]  hydroxypropyl methylcellulose / hypromellose (ISOPTO TEARS / GONIOVISC) 2.5 % ophthalmic solution Place 1 drop into both eyes 4 (four) times daily as needed for dry eyes.   Yes [provider]  latanoprost (XALATAN) 0.005 % ophthalmic solution Place 1 drop into both eyes at bedtime.   Yes [provider]  loratadine (CLARITIN) 10 MG tablet Take 10 mg by mouth daily.   Yes [provider]  losartan (COZAAR) 25 MG tablet Take 25 mg by mouth  daily.   Yes [provider]  omeprazole (PRILOSEC) 40 MG capsule Take 40 mg by mouth daily.   Yes [provider]  tiotropium (SPIRIVA) 18 MCG inhalation capsule Place 18 mcg into inhaler and inhale daily.   Yes [provider]  apixaban (ELIQUIS) 5 MG TABS tablet Take 1 tablet by mouth every 12 (twelve) hours. 07/01/19   [provider]  aspirin 81 MG chewable tablet Chew 81 mg by mouth daily. Patient not taking: Reported on 04/09/2020    [provider]  brimonidine (ALPHAGAN) 0.2 % ophthalmic solution Place 1 drop into both eyes 2 (two) times daily. Patient not taking: Reported on 04/09/2020    [provider]  doxycycline (ADOXA) 50 MG tablet Take 50 mg by mouth daily.  Patient not taking: Reported on 04/09/2020    [provider]  metFORMIN (GLUCOPHAGE) 500 MG tablet Take 500 mg by mouth daily at 6 (six) AM.    [provider]  warfarin (COUMADIN) 7.5 MG tablet Take 7.5 mg by mouth daily.    [provider]    Physical Exam: Vitals:   04/08/20 2330 04/09/20 0000 04/09/20 0002 04/09/20 0049  BP: (!) 144/82 (!) 162/96  (!) 146/90  Pulse: 65 66  70  Resp: '12 17  20  '$ Temp:   98.5 F (36.9 C) 99 F (37.2 C)  TempSrc:   Oral   SpO2: 100% 99%  100%  Weight:      Height:        Constitutional: NAD, calm, comfortable Vitals:   04/08/20 2330 04/09/20 0000 04/09/20 0002 04/09/20 0049  BP: (!) 144/82 (!) 162/96  (!) 146/90  Pulse: 65 66  70  Resp: '12 17  20  '$ Temp:   98.5 F (36.9 C) 99 F (37.2 C)  TempSrc:   Oral   SpO2: 100% 99%  100%  Weight:      Height:       General: WDWN, Alert and oriented x3.  Eyes: EOMI, PERRL, conjunctivae normal.  Sclera nonicteric HENT:  Hydro/AT, external ears normal.  Nares patent without epistasis.  Mucous membranes are moist.  Neck: Soft, normal range of motion, supple, no masses, no thyromegaly. Trachea midline Respiratory: clear to auscultation bilaterally, no  wheezing, no crackles. Normal respiratory effort. No accessory muscle use.  Cardiovascular: Irregularly irregular rhythm.  Normal rate.  No murmurs / rubs / gallops. No extremity edema. 2+ pedal pulses Abdomen: Soft, no tenderness, nondistended, no rebound or guarding. No masses palpated. Bowel sounds normoactive Musculoskeletal: FROM. No cyanosis. No joint deformity upper and lower extremities. Normal muscle tone.  Skin: Warm, dry, intact no rashes, lesions, ulcers. No induration.  Neurologic: CN 2-12 grossly intact. Sensation intact. Strength 5/5 in all extremities.   Psychiatric: Normal judgment and insight.  Normal mood.    Labs on Admission: I have personally reviewed following labs and imaging studies  CBC: Recent Labs  Lab 04/05/20 1704 04/08/20 1518  WBC 4.3 4.9  HGB 12.3* 12.1*  HCT 39.8 38.7*  MCV 92.1 90.2  PLT 196 [id number]    Basic Metabolic Panel: Recent Labs  Lab 04/05/20 1704 04/08/20 1518  NA 135 136  K 4.7 4.4  CL 101 101  CO2 27 24  GLUCOSE 119* 106*  BUN 42* 55*  CREATININE 2.42* 2.53*  CALCIUM 10.1 9.6    GFR: Estimated Creatinine Clearance: 24.3 mL/min (A) (by C-G formula based on SCr of 2.53 mg/dL (H)).  Liver Function Tests: No results for input(s): AST, ALT, ALKPHOS, BILITOT, PROT, ALBUMIN in the last 168 hours.  Urine analysis:    Component Value Date/Time   COLORURINE YELLOW 04/08/2020 1745   APPEARANCEUR CLEAR 04/08/2020 1745   LABSPEC 1.020 04/08/2020 1745   PHURINE 5.5 04/08/2020 1745   [place] 04/08/2020 1745   HGBUR NEGATIVE 04/08/2020 1745   BILIRUBINUR NEGATIVE 04/08/2020 1745   KETONESUR NEGATIVE 04/08/2020 1745   PROTEINUR NEGATIVE 04/08/2020 1745   NITRITE NEGATIVE 04/08/2020 1745   LEUKOCYTESUR TRACE (A) 04/08/2020 1745    Radiological Exams on Admission: DG Chest Port 1 View  Result Date: 04/08/2020 CLINICAL DATA:  Dizziness, weakness EXAM: PORTABLE CHEST 1 VIEW COMPARISON:  01/02/2020 FINDINGS: Single frontal  view of the chest demonstrates marked enlargement the cardiac silhouette. No acute airspace  disease, effusion, or pneumothorax. Mild chronic central vascular congestion. Stable degenerative changes of the right shoulder. No acute bony abnormalities. IMPRESSION: 1. Mild chronic central vascular congestion without overt edema. 2. Stable enlarged cardiac silhouette. Electronically Signed   By: Randa Ngo M.D.   On: 04/08/2020 16:31    EKG: Independently reviewed.  EKG shows atrial flutter/fibrillation with occasional PVCs.  No acute ST elevation or depression.  QTc 418  Assessment/Plan Principal Problem:   Acute renal failure superimposed on stage 3 chronic kidney disease, unspecified acute renal failure type, unspecified whether stage 3a or 3b CKD  Jordan Hoffman is placed on telemetry for observation.  He is given IV fluid hydration with LR at 100 mils per hour overnight. Hold Metformin, Lasix and ARB therapy. Recheck electrolytes and renal function with labs in morning  Active Problems:   COPD (chronic obstructive pulmonary disease)  Symbicort is changed to [facility] per formulary for COPD.  Continue Spiriva once a day and albuterol as needed for cough, shortness of breath, wheeze.    Type 2 diabetes mellitus  Blood sugars will be monitored with meals and at bedtime.  Sliding scale insulin provide as needed for glycemic control.  Check hemoglobin A1c.  Metformin and SU therapy held while in the hospital    Paroxysmal atrial fibrillation Chronic and stable.  Patient on Eliquis     DVT prophylaxis: Pt is on Eliquis for anticoagulation which is continued Code Status:   Full code  Family Communication:  Diagnosis and plan discussed with patient.  Questions answered.  Further recommendations to follow as clinically indicated Disposition Plan:   Patient is from:  Home  Anticipated DC to:  Home  Anticipated DC date:  Anticipate less than 2 midnight stay in the hospital  Anticipated DC  barriers: No barriers to discharge identified this time  Admission status:  Observation   Eben Burow MD [facility]  How to contact the [facility] Attending or Consulting provider [place] or covering provider during after hours [place], for this patient?   1. Check the care team in [facility] and look for a) attending/consulting [facility] provider listed and b) the [facility] team listed 2. Log into [email or web address] and use [place]'s universal password to access. If you do not have the password, please contact the hospital operator. 3. Locate the [facility] provider you are looking for under [facility] and page to a number that you can be directly reached. 4. If you still have difficulty reaching the provider, please page the [facility] (Director on Call) for the Hospitalists listed on amion for assistance.  04/09/2020, 2:14 AM

## 2020-04-09 NOTE — Progress Notes (Signed)
PROGRESS NOTE    Jordan Hoffman  [id number] DOB: [birth year] DOA: 04/08/2020 PCP: [facility]    Brief Narrative:  87 y.o. male with medical history significant for HTN, DMT2, A fib, COPD, CKD 3, HLD who presents for evaluation of his weakness and low blood pressure. Jordan Hoffman was brought to the ER by his wife for evaluation of 3 weeks or so feeling weak all over, dizzy with lightheadedness. Pt was found to have concerns for acute renal failure  Assessment & Plan:   Principal Problem:   Acute renal failure superimposed on stage 3 chronic kidney disease, unspecified acute renal failure type, unspecified whether stage 3a or 3b CKD (HCC) Active Problems:   COPD (chronic obstructive pulmonary disease) (HCC)   Type 2 diabetes mellitus (HCC)   Paroxysmal atrial fibrillation (HCC)    Acute renal failure superimposed on stage 3 chronic kidney disease, unspecified acute renal failure type, unspecified whether stage 3a or 3b CKD  -Outside records reviewed. Most recent Cr noted to be from 2018, noted to be 1.7 Hold Metformin, Lasix and ARB therapy. -continue IVF hydration -Repeat renal function in AM -Patient reports history of enlarged prostate s/p Urologic surgery years ago. Will order renal US to rule out bladder outlet obstruction    COPD (chronic obstructive pulmonary disease)  Symbicort is changed to [facility] per formulary for COPD.  Continue Spiriva once a day and albuterol as needed for cough, shortness of breath, wheeze. -Currently on minimal O2 support    Type 2 diabetes mellitus  Blood sugars will be monitored with meals and at bedtime.   -Continue with sliding scale insulin provide as needed for glycemic control.  Check hemoglobin A1c.   -Continue with metformin and SU therapy held while in the hospital    Paroxysmal atrial fibrillation Chronic and stable.   -Continue patient on Eliquis  OSA -patient reports hx of daytime sleepiness, uses CPAP at home -will  order CPAP while in hospital  DVT prophylaxis: Eliquis Code Status: Full Family Communication: Pt in room, family not at bedside  Status is: Observation  The patient will require care spanning > 2 midnights and should be moved to inpatient because: IV treatments appropriate due to intensity of illness or inability to take PO and Inpatient level of care appropriate due to severity of illness  Dispo: The patient is from: Home              Anticipated d/c is to: Home              Patient currently is not medically stable to d/c.   Difficult to place patient No   Consultants:     Procedures:     Antimicrobials: Anti-infectives (From admission, onward)   None       Subjective: Denies chest pain or sob  Objective: Vitals:   04/09/20 0830 04/09/20 0854 04/09/20 1044 04/09/20 1343  BP:  134/75 130/73 127/75  Pulse:  87 73 71  Resp:  16  20  Temp:  98.7 F (37.1 C)  98.9 F (37.2 C)  TempSrc:  Oral    SpO2: 94% 93%  95%  Weight:      Height:        Intake/Output Summary (Last 24 hours) at 04/09/2020 1716 Last data filed at 04/09/2020 0955 Gross per 24 hour  Intake 1623.7 ml  Output 1125 ml  Net 498.7 ml   Filed Weights   04/08/20 1453  Weight: 95.3 kg  Examination: General exam: Awake, laying in bed, in nad Respiratory system: Normal respiratory effort, no wheezing Cardiovascular system: regular rate, s1, s2 Gastrointestinal system: Soft, nondistended, positive BS Central nervous system: CN2-12 grossly intact, strength intact Extremities: Perfused, no clubbing Skin: Normal skin turgor, no notable skin lesions seen Psychiatry: Mood normal // no visual hallucinations   Data Reviewed: I have personally reviewed following labs and imaging studies  CBC: Recent Labs  Lab 04/05/20 1704 04/08/20 1518 04/09/20 0417  WBC 4.3 4.9 4.2  HGB 12.3* 12.1* 11.6*  HCT 39.8 38.7* 37.5*  MCV 92.1 90.2 91.9  PLT 196 194 0000000   Basic Metabolic Panel: Recent Labs   Lab 04/05/20 1704 04/08/20 1518 04/09/20 0417  NA 135 136 141  K 4.7 4.4 4.1  CL 101 101 111  CO2 '27 24 22  '$ GLUCOSE 119* 106* 101*  BUN 42* 55* 43*  CREATININE 2.42* 2.53* 2.26*  CALCIUM 10.1 9.6 9.4   GFR: Estimated Creatinine Clearance: 27.2 mL/min (A) (by C-G formula based on SCr of 2.26 mg/dL (H)). Liver Function Tests: No results for input(s): AST, ALT, ALKPHOS, BILITOT, PROT, ALBUMIN in the last 168 hours. No results for input(s): LIPASE, AMYLASE in the last 168 hours. No results for input(s): AMMONIA in the last 168 hours. Coagulation Profile: Recent Labs  Lab 04/08/20 1518  INR 1.5*   Cardiac Enzymes: No results for input(s): CKTOTAL, CKMB, CKMBINDEX, TROPONINI in the last 168 hours. BNP (last 3 results) No results for input(s): PROBNP in the last 8760 hours. HbA1C: Recent Labs    04/09/20 0417  HGBA1C 6.4*   CBG: Recent Labs  Lab 04/08/20 1609 04/09/20 0742 04/09/20 1203 04/09/20 1707  GLUCAP 99 92 131* 87   Lipid Profile: No results for input(s): CHOL, HDL, LDLCALC, TRIG, CHOLHDL, LDLDIRECT in the last 72 hours. Thyroid Function Tests: No results for input(s): TSH, T4TOTAL, FREET4, T3FREE, THYROIDAB in the last 72 hours. Anemia Panel: No results for input(s): VITAMINB12, FOLATE, FERRITIN, TIBC, IRON, RETICCTPCT in the last 72 hours. Sepsis Labs: No results for input(s): PROCALCITON, LATICACIDVEN in the last 168 hours.  Recent Results (from the past 240 hour(s))  SARS CORONAVIRUS 2 (TAT 6-24 HRS) Nasopharyngeal Nasopharyngeal Swab     Status: None   Collection Time: 04/08/20  7:35 PM   Specimen: Nasopharyngeal Swab  Result Value Ref Range Status   SARS Coronavirus 2 NEGATIVE NEGATIVE Final    Comment: (NOTE) SARS-CoV-2 target nucleic acids are NOT DETECTED.  The SARS-CoV-2 RNA is generally detectable in upper and lower respiratory specimens during the acute phase of infection. Negative results do not preclude SARS-CoV-2 infection, do not rule  out co-infections with other pathogens, and should not be used as the sole basis for treatment or other patient management decisions. Negative results must be combined with clinical observations, patient history, and epidemiological information. The expected result is Negative.  Fact Sheet for Patients: SugarRoll.be  Fact Sheet for Healthcare Providers: https://www.woods-mathews.com/  This test is not yet approved or cleared by the [place] FDA and  has been authorized for detection and/or diagnosis of SARS-CoV-2 by FDA under an Emergency Use Authorization (EUA). This EUA will remain  in effect (meaning this test can be used) for the duration of the COVID-19 declaration under Se ction 564(b)(1) of the Act, 21 U.S.C. section 360bbb-3(b)(1), unless the authorization is terminated or revoked sooner.  Performed at [facility], [place] [address] [place], [place] [address]      Radiology Studies: DG Chest  Port 1 View  Result Date: 04/08/2020 CLINICAL DATA:  Dizziness, weakness EXAM: PORTABLE CHEST 1 VIEW COMPARISON:  01/02/2020 FINDINGS: Single frontal view of the chest demonstrates marked enlargement the cardiac silhouette. No acute airspace disease, effusion, or pneumothorax. Mild chronic central vascular congestion. Stable degenerative changes of the right shoulder. No acute bony abnormalities. IMPRESSION: 1. Mild chronic central vascular congestion without overt edema. 2. Stable enlarged cardiac silhouette. Electronically Signed   By: Randa Ngo M.D.   On: 04/08/2020 16:31    Scheduled Meds: . amiodarone  200 mg Oral Daily  . amLODipine  10 mg Oral Daily  . apixaban  2.5 mg Oral Q12H  . fluticasone furoate-vilanterol  1 puff Inhalation Daily  . insulin aspart  0-15 Units Subcutaneous TID WC  . insulin aspart  0-5 Units Subcutaneous QHS  . latanoprost  1 drop Both Eyes QHS  . umeclidinium bromide  1 puff Inhalation Daily    Continuous Infusions: . lactated ringers 100 mL/hr at 04/09/20 0329     LOS: 0 days   Marylu Lund, MD [facility] Pager On Amion  If 7PM-7AM, please contact night-coverage 04/09/2020, 5:16 PM

## 2020-04-09 NOTE — Evaluation (Signed)
Physical Therapy Evaluation Patient Details Name: Jordan Hoffman MRN: [id number] DOB: 09-14-1928 Today's Date: 04/09/2020   History of Present Illness  87 y.o. male with medical history significant for HTN, DMT2, A fib, COPD, CKD 3, HLD who presents for evaluation of his weakness and low blood pressure. Jordan Hoffman was brought to the ER by his wife for evaluation of 3 weeks or so feeling weak all over, dizzy with lightheadedness.  Clinical Impression  Pt admitted with above diagnosis.  Pt currently with functional limitations due to the deficits listed below (see PT Problem List). Pt will benefit from skilled PT to increase their independence and safety with mobility to allow discharge to the venue listed below.  Pt assisted with ambulating short distance in hallway. Pt reports chronic low back and dyspnea from COPD at baseline.  Pt may benefit from HHPT to improve endurance and strength. Pt reports appointment with [facility] MD for back pain in April.  Pt feels comfortable with plan for return home upon d/c and uses RW for ambulation at baseline.      Follow Up Recommendations Home health PT    Equipment Recommendations  None recommended by PT    Recommendations for Other Services       Precautions / Restrictions Precautions Precautions: Fall      Mobility  Bed Mobility Overal bed mobility: Needs Assistance Bed Mobility: Supine to Sit     Supine to sit: Min guard     General bed mobility comments: min/guard for lines only, mild dizziness upon sitting which resolved    Transfers Overall transfer level: Needs assistance Equipment used: Rolling walker (2 wheeled) Transfers: Sit to/from Stand Sit to Stand: From elevated surface;Min guard         General transfer comment: min/guard for safety, bed elevated (pt 6'4")  Ambulation/Gait Ambulation/Gait assistance: Min guard Gait Distance (Feet): 100 Feet Assistive device: Rolling walker (2 wheeled) Gait Pattern/deviations:  Step-through pattern;Decreased stride length;Trunk flexed     General Gait Details: pt reports hip pain (from chronic low back pain) and dyspnea limiting; pt reports pain and dyspnea no worse then typical baseline; distance to tolerance; pt denies dizziness  Stairs            Wheelchair Mobility    Modified Rankin (Stroke Patients Only)       Balance Overall balance assessment: Needs assistance         Standing balance support: No upper extremity supported Standing balance-Leahy Scale: Fair Standing balance comment: static fair, reliant on RW for support with mobility                             Pertinent Vitals/Pain Pain Assessment: No/denies pain    Home Living Family/patient expects to be discharged to:: Private residence Living Arrangements: Spouse/significant other Available Help at Discharge: Family Type of Home: House Home Access: Level entry     Home Layout: One level Home Equipment: Environmental consultant - 2 wheels      Prior Function Level of Independence: Needs assistance   Gait / Transfers Assistance Needed: typically uses RW for ambulation, distance depends on COPD status  ADL's / Homemaking Assistance Needed: occasional assist, fatigues quickly        Hand Dominance        Extremity/Trunk Assessment        Lower Extremity Assessment Lower Extremity Assessment: Generalized weakness       Communication   Communication: No difficulties  Cognition Arousal/Alertness: Awake/alert Behavior During Therapy: WFL for tasks assessed/performed Overall Cognitive Status: Within Functional Limits for tasks assessed                                        General Comments      Exercises     Assessment/Plan    PT Assessment Patient needs continued PT services  PT Problem List Decreased balance;Decreased range of motion;Decreased mobility;Decreased strength;Decreased knowledge of use of DME;Decreased activity tolerance        PT Treatment Interventions DME instruction;Gait training;Balance training;Functional mobility training;Therapeutic activities;Patient/family education;Therapeutic exercise;Neuromuscular re-education    PT Goals (Current goals can be found in the Care Plan section)  Acute Rehab PT Goals PT Goal Formulation: With patient Time For Goal Achievement: 04/23/20 Potential to Achieve Goals: Good    Frequency Min 3X/week   Barriers to discharge        Co-evaluation               AM-PAC PT "6 Clicks" Mobility  Outcome Measure Help needed turning from your back to your side while in a flat bed without using bedrails?: A Little Help needed moving from lying on your back to sitting on the side of a flat bed without using bedrails?: A Little Help needed moving to and from a bed to a chair (including a wheelchair)?: A Little Help needed standing up from a chair using your arms (e.g., wheelchair or bedside chair)?: A Little Help needed to walk in hospital room?: A Little Help needed climbing 3-5 steps with a railing? : A Little 6 Click Score: 18    End of Session Equipment Utilized During Treatment: Gait belt Activity Tolerance: Patient tolerated treatment well Patient left: in chair;with call bell/phone within reach;with family/visitor present;with chair alarm set Nurse Communication: Mobility status PT Visit Diagnosis: Other abnormalities of gait and mobility (R26.89)    Time: 1442-1500 PT Time Calculation (min) (ACUTE ONLY): 18 min   Charges:   PT Evaluation $PT Eval Low Complexity: 1 Low         Kati PT, DPT Acute Rehabilitation Services Pager: [phone] Office: [phone]   Trena Platt 04/09/2020, 3:21 PM

## 2020-04-09 NOTE — ED Notes (Signed)
Transport at the bedside.

## 2020-04-09 NOTE — Plan of Care (Signed)
°  Problem: Coping: °Goal: Level of anxiety will decrease °Outcome: Progressing °  °

## 2020-04-10 LAB — COMPREHENSIVE METABOLIC PANEL
ALT: 17 U/L (ref 0–44)
AST: 22 U/L (ref 15–41)
Albumin: 3.3 g/dL — ABNORMAL LOW (ref 3.5–5.0)
Alkaline Phosphatase: 62 U/L (ref 38–126)
Anion gap: 7 (ref 5–15)
BUN: 36 mg/dL — ABNORMAL HIGH (ref 8–23)
CO2: 23 mmol/L (ref 22–32)
Calcium: 9.3 mg/dL (ref 8.9–10.3)
Chloride: 108 mmol/L (ref 98–111)
Creatinine, Ser: 2.12 mg/dL — ABNORMAL HIGH (ref 0.61–1.24)
GFR, Estimated: 29 mL/min — ABNORMAL LOW (ref >60)
Glucose, Bld: 83 mg/dL (ref 70–99)
Potassium: 4.1 mmol/L (ref 3.5–5.1)
Sodium: 138 mmol/L (ref 135–145)
Total Bilirubin: 1.1 mg/dL (ref 0.3–1.2)
Total Protein: 6.6 g/dL (ref 6.5–8.1)

## 2020-04-10 LAB — CBC
HCT: 35.3 % — ABNORMAL LOW (ref 39.0–52.0)
Hemoglobin: 11 g/dL — ABNORMAL LOW (ref 13.0–17.0)
MCH: 28.6 pg (ref 26.0–34.0)
MCHC: 31.2 g/dL (ref 30.0–36.0)
MCV: 91.9 fL (ref 80.0–100.0)
Platelets: 174 10*3/uL (ref 150–400)
RBC: 3.84 MIL/uL — ABNORMAL LOW (ref 4.22–5.81)
RDW: 15 % (ref 11.5–15.5)
WBC: 4.4 10*3/uL (ref 4.0–10.5)
nRBC: 0 % (ref 0.0–0.2)

## 2020-04-10 LAB — GLUCOSE, CAPILLARY
Glucose-Capillary: 82 mg/dL (ref 70–99)
Glucose-Capillary: 122 mg/dL — ABNORMAL HIGH (ref 70–99)

## 2020-04-10 NOTE — Discharge Summary (Signed)
Physician Discharge Summary  Jordan Hoffman [id number] DOB: 1928-10-18 DOA: 04/08/2020  PCP: [facility]  Admit date: 04/08/2020 Discharge date: 04/10/2020  Admitted From: Home Disposition:  Home  Recommendations for Outpatient Follow-up:  1. Follow up with PCP in 2-3 weeks  Home Health:PT   Discharge Condition:Stable CODE STATUS:Full Diet recommendation: Diabetic   Brief/Interim Summary: 87 y.o.malewith medical history significant forHTN, DMT2, A fib, COPD, CKD 3, HLD who presents for evaluation ofhis weakness and low blood pressure.Mr. Brys was brought to the ERby his wife for evaluation of 3 weeks or so feeling weak all over, dizzy withlightheadedness. Pt was found to have concerns for acute renal failure  Discharge Diagnoses:  Principal Problem:   Acute renal failure superimposed on stage 3 chronic kidney disease, unspecified acute renal failure type, unspecified whether stage 3a or 3b CKD (HCC) Active Problems:   COPD (chronic obstructive pulmonary disease) (HCC)   Type 2 diabetes mellitus (HCC)   Paroxysmal atrial fibrillation (HCC)   ARF (acute renal failure) (HCC)  Acute renal failure superimposed on stage 3 chronic kidney disease, unspecified acute renal failure type, unspecified whether stage 3a or 3b CKD  -Outside records reviewed. Most recent Cr noted to be from 2018, noted to be 1.7 Held Metformin, Lasix and ARB therapy while in hospital -continue IVF hydration -Renal function improved with IVF -Patient reports history of enlarged prostate s/p Urologic surgery years ago.  -renal US -Would continue to hold losartan on d/c  COPD (chronic obstructive pulmonary disease)  Symbicort is changed to [facility] per formulary for COPD. Continue Spiriva once a day and albuterol as needed for cough, shortness of breath, wheeze. -Currently on minimal O2 support  Type 2 diabetes mellitus  Blood sugars will be monitored with meals and at  bedtime.  -Continue with sliding scale insulin provide as needed for glycemic control. A1c of 6.4  Paroxysmal atrial fibrillation Had remained chronic and stable.  -Continue patient on Eliquis  OSA -patient reports hx of daytime sleepiness, uses CPAP at home -was continued on CPAP while in hospital  Discharge Instructions   Allergies as of 04/10/2020      Reactions   Atorvastatin Other (See Comments)   Muscle pain    Ciprofloxacin    Hives    Codeine    Hives    Erythromycin    Hives    Flunisolide Hives   epistaxis   Lisinopril    Cough    Loratadine    Hives    Niacin And Related    Hives    Penicillins Hives   Has patient had a PCN reaction causing immediate rash, facial/tongue/throat swelling, SOB or lightheadedness with hypotension: YES Has patient had a PCN reaction causing severe rash involving mucus membranes or skin necrosis: NO Has patient had a PCN reaction that required hospitalization NO Has patient had a PCN reaction occurring within the last 10 years: NO If all of the above answers are "NO", then may proceed with Cephalosporin use.   Pravastatin Other (See Comments)   Hives    Simvastatin Hives   weakness   Statins Hives   weakness   Sulfa Antibiotics    Hives    Zantac [ranitidine]    Hives       Medication List    STOP taking these medications   losartan 100 MG tablet Commonly known as: COZAAR     TAKE these medications   acetaminophen 325 MG tablet Commonly known as: TYLENOL Take 975  mg by mouth 2 (two) times daily.   albuterol (2.5 MG/3ML) 0.083% nebulizer solution Commonly known as: PROVENTIL Take 2.5 mg by nebulization every 6 (six) hours as needed for wheezing or shortness of breath.   albuterol 108 (90 Base) MCG/ACT inhaler Commonly known as: VENTOLIN HFA Inhale 2 puffs into the lungs every 6 (six) hours as needed for wheezing or shortness of breath.   amiodarone 200 MG tablet Commonly known as: PACERONE Take 200 mg  by mouth daily.   amLODipine 10 MG tablet Commonly known as: NORVASC Take 10 mg by mouth daily.   apixaban 5 MG Tabs tablet Commonly known as: ELIQUIS Take 5 mg by mouth every 12 (twelve) hours.   Calcium Carbonate-Vitamin D3 600-400 MG-UNIT Tabs Take 1 tablet by mouth 2 (two) times daily.   CALCIUM PO Take 1 tablet by mouth daily.   carboxymethylcellulose 0.5 % Soln Commonly known as: REFRESH PLUS Place 1 drop into both eyes 4 (four) times daily.   cholecalciferol 25 MCG (1000 UNIT) tablet Commonly known as: VITAMIN D Take 1,000 Units by mouth daily.   diphenhydrAMINE 25 MG tablet Commonly known as: BENADRYL Take 25 mg by mouth every 6 (six) hours as needed for sleep.   fluticasone 50 MCG/ACT nasal spray Commonly known as: FLONASE Place 2 sprays into both nostrils daily as needed for allergies or rhinitis.   Fluticasone-Salmeterol 250-50 MCG/DOSE Aepb Commonly known as: ADVAIR Inhale 1 puff into the lungs 2 (two) times daily.   furosemide 40 MG tablet Commonly known as: LASIX Take 20 mg by mouth daily at 6 (six) AM.   gabapentin 300 MG capsule Commonly known as: NEURONTIN Take 300 mg by mouth 2 (two) times daily.   glipiZIDE 5 MG tablet Commonly known as: GLUCOTROL Take 2.5 mg by mouth every evening.   guaiFENesin 600 MG 12 hr tablet Commonly known as: MUCINEX Take 600 mg by mouth 2 (two) times daily.   HYDROCERIN EX Apply 1 application topically daily as needed (dry skin).   latanoprost 0.005 % ophthalmic solution Commonly known as: XALATAN Place 1 drop into both eyes at bedtime.   loratadine 10 MG tablet Commonly known as: CLARITIN Take 10 mg by mouth daily.   omeprazole 20 MG capsule Commonly known as: PRILOSEC Take 20 mg by mouth daily.   psyllium 58.6 % powder Commonly known as: METAMUCIL Take 1 packet by mouth daily.   sodium chloride 0.65 % Soln nasal spray Commonly known as: OCEAN Place 2 sprays into both nostrils 2 (two) times daily  as needed for congestion.   terbinafine 1 % cream Commonly known as: LAMISIL Apply 1 application topically 2 (two) times daily as needed (between toes).   THERA-GESIC EX Apply 1 application topically every 6 (six) hours as needed (pain AVOID FACE AND EYES - [place] HANDS AFTER USING).   Tiotropium Bromide Monohydrate 2.5 MCG/ACT Aers Inhale 2 puffs into the lungs daily.   urea 20 % cream Commonly known as: CARMOL Apply 1 application topically 2 (two) times daily as needed (dry skin on feet DO NOT USE BETWEEN TOES).       Follow-up [place]. Schedule an appointment as soon as possible for a visit in 2 week(s).   Specialty: General Practice Contact information: [address] [place] [address] [phone]              Allergies  Allergen Reactions  . Atorvastatin Other (See Comments)    Muscle pain   .  Ciprofloxacin     Hives   . Codeine     Hives   . Erythromycin     Hives   . Flunisolide Hives    epistaxis  . Lisinopril     Cough   . Loratadine     Hives   . Niacin And Related     Hives   . Penicillins Hives    Has patient had a PCN reaction causing immediate rash, facial/tongue/throat swelling, SOB or lightheadedness with hypotension: YES Has patient had a PCN reaction causing severe rash involving mucus membranes or skin necrosis: NO Has patient had a PCN reaction that required hospitalization NO Has patient had a PCN reaction occurring within the last 10 years: NO If all of the above answers are "NO", then may proceed with Cephalosporin use.  . Pravastatin Other (See Comments)    Hives   . Simvastatin Hives    weakness  . Statins Hives    weakness  . Sulfa Antibiotics     Hives   . Zantac [Ranitidine]     Hives    Procedures/Studies: US RENAL  Result Date: 04/09/2020 CLINICAL DATA:  Acute renal failure, hypertension EXAM: RENAL / URINARY TRACT ULTRASOUND COMPLETE COMPARISON:  None. FINDINGS: Right Kidney: Renal  measurements: 9.7 x 5.1 x 5.8 cm = volume: 147.7 mL. Increased renal cortical echotexture consistent with medical renal disease. Corticomedullary differentiation is preserved. No hydronephrosis or mass. Left Kidney: Renal measurements: 10.5 x 5.4 x 4.3 cm = volume: 127.5 mL. Mild increased renal cortical echotexture consistent with medical renal disease. Corticomedullary differentiation is preserved. No hydronephrosis or mass. Bladder: Appears normal for degree of bladder distention. Other: None. IMPRESSION: 1. Increased renal cortical echotexture consistent with medical renal disease. Electronically Signed   By: Randa Ngo M.D.   On: 04/09/2020 18:50   DG Chest Port 1 View  Result Date: 04/08/2020 CLINICAL DATA:  Dizziness, weakness EXAM: PORTABLE CHEST 1 VIEW COMPARISON:  01/02/2020 FINDINGS: Single frontal view of the chest demonstrates marked enlargement the cardiac silhouette. No acute airspace disease, effusion, or pneumothorax. Mild chronic central vascular congestion. Stable degenerative changes of the right shoulder. No acute bony abnormalities. IMPRESSION: 1. Mild chronic central vascular congestion without overt edema. 2. Stable enlarged cardiac silhouette. Electronically Signed   By: Randa Ngo M.D.   On: 04/08/2020 16:31     Subjective: Eager to go home  Discharge Exam: Vitals:   04/10/20 0856 04/10/20 0926  BP:  138/80  Pulse:    Resp:    Temp:    SpO2: 94%    Vitals:   04/09/20 2052 04/10/20 0507 04/10/20 0856 04/10/20 0926  BP: 120/73 (!) 150/79  138/80  Pulse: 66 (!) 56    Resp: 20 19    Temp: 98.3 F (36.8 C) 98.1 F (36.7 C)    TempSrc: Oral Oral    SpO2: 96% 97% 94%   Weight:      Height:        General: Pt is alert, awake, not in acute distress Cardiovascular: RRR, S1/S2 +, no rubs, no gallops Respiratory: CTA bilaterally, no wheezing, no rhonchi Abdominal: Soft, NT, ND, bowel sounds + Extremities: no edema, no cyanosis   The results of  significant diagnostics from this hospitalization (including imaging, microbiology, ancillary and laboratory) are listed below for reference.     Microbiology: Recent Results (from the past 240 hour(s))  SARS CORONAVIRUS 2 (TAT 6-24 HRS) Nasopharyngeal Nasopharyngeal Swab     Status: None  Collection Time: 04/08/20  7:35 PM   Specimen: Nasopharyngeal Swab  Result Value Ref Range Status   SARS Coronavirus 2 NEGATIVE NEGATIVE Final    Comment: (NOTE) SARS-CoV-2 target nucleic acids are NOT DETECTED.  The SARS-CoV-2 RNA is generally detectable in upper and lower respiratory specimens during the acute phase of infection. Negative results do not preclude SARS-CoV-2 infection, do not rule out co-infections with other pathogens, and should not be used as the sole basis for treatment or other patient management decisions. Negative results must be combined with clinical observations, patient history, and epidemiological information. The expected result is Negative.  Fact Sheet for Patients: SugarRoll.be  Fact Sheet for Healthcare Providers: https://www.woods-mathews.com/  This test is not yet approved or cleared by the [place] FDA and  has been authorized for detection and/or diagnosis of SARS-CoV-2 by FDA under an Emergency Use Authorization (EUA). This EUA will remain  in effect (meaning this test can be used) for the duration of the COVID-19 declaration under Se ction 564(b)(1) of the Act, 21 U.S.C. section 360bbb-3(b)(1), unless the authorization is terminated or revoked sooner.  Performed at [facility], [place] [address] [place], [place] [address]      Labs: BNP (last 3 results) Recent Labs    01/02/20 1308 04/08/20 1626  BNP 272.2* [id number]   Basic Metabolic Panel: Recent Labs  Lab 04/05/20 1704 04/08/20 1518 04/09/20 0417 04/10/20 0404  NA 135 136 141 138  K 4.7 4.4 4.1 4.1  CL 101 101 111 108  CO2 '27 24 22 23   '$ GLUCOSE 119* 106* 101* 83  BUN 42* 55* 43* 36*  CREATININE 2.42* 2.53* 2.26* 2.12*  CALCIUM 10.1 9.6 9.4 9.3   Liver Function Tests: Recent Labs  Lab 04/10/20 0404  AST 22  ALT 17  ALKPHOS 62  BILITOT 1.1  PROT 6.6  ALBUMIN 3.3*   No results for input(s): LIPASE, AMYLASE in the last 168 hours. No results for input(s): AMMONIA in the last 168 hours. CBC: Recent Labs  Lab 04/05/20 1704 04/08/20 1518 04/09/20 0417 04/10/20 0404  WBC 4.3 4.9 4.2 4.4  HGB 12.3* 12.1* 11.6* 11.0*  HCT 39.8 38.7* 37.5* 35.3*  MCV 92.1 90.2 91.9 91.9  PLT 196 194 178 174   Cardiac Enzymes: No results for input(s): CKTOTAL, CKMB, CKMBINDEX, TROPONINI in the last 168 hours. BNP: Invalid input(s): POCBNP CBG: Recent Labs  Lab 04/09/20 1203 04/09/20 1707 04/09/20 2054 04/10/20 0730 04/10/20 1138  GLUCAP 131* 87 105* 82 122*   D-Dimer No results for input(s): DDIMER in the last 72 hours. Hgb A1c Recent Labs    04/09/20 0417  HGBA1C 6.4*   Lipid Profile No results for input(s): CHOL, HDL, LDLCALC, TRIG, CHOLHDL, LDLDIRECT in the last 72 hours. Thyroid function studies No results for input(s): TSH, T4TOTAL, T3FREE, THYROIDAB in the last 72 hours.  Invalid input(s): FREET3 Anemia work up No results for input(s): VITAMINB12, FOLATE, FERRITIN, TIBC, IRON, RETICCTPCT in the last 72 hours. Urinalysis    Component Value Date/Time   COLORURINE YELLOW 04/08/2020 1745   APPEARANCEUR CLEAR 04/08/2020 1745   LABSPEC 1.020 04/08/2020 1745   PHURINE 5.5 04/08/2020 1745   GLUCOSEU NEGATIVE 04/08/2020 1745   HGBUR NEGATIVE 04/08/2020 1745   BILIRUBINUR NEGATIVE 04/08/2020 1745   KETONESUR NEGATIVE 04/08/2020 1745   PROTEINUR NEGATIVE 04/08/2020 1745   NITRITE NEGATIVE 04/08/2020 1745   LEUKOCYTESUR TRACE (A) 04/08/2020 1745   Sepsis Labs Invalid input(s): PROCALCITONIN,  WBC,  LACTICIDVEN Microbiology Recent  Results (from the past 240 hour(s))  SARS CORONAVIRUS 2 (TAT 6-24 HRS)  Nasopharyngeal Nasopharyngeal Swab     Status: None   Collection Time: 04/08/20  7:35 PM   Specimen: Nasopharyngeal Swab  Result Value Ref Range Status   SARS Coronavirus 2 NEGATIVE NEGATIVE Final    Comment: (NOTE) SARS-CoV-2 target nucleic acids are NOT DETECTED.  The SARS-CoV-2 RNA is generally detectable in upper and lower respiratory specimens during the acute phase of infection. Negative results do not preclude SARS-CoV-2 infection, do not rule out co-infections with other pathogens, and should not be used as the sole basis for treatment or other patient management decisions. Negative results must be combined with clinical observations, patient history, and epidemiological information. The expected result is Negative.  Fact Sheet for Patients: SugarRoll.be  Fact Sheet for Healthcare Providers: https://www.woods-mathews.com/  This test is not yet approved or cleared by the [place] FDA and  has been authorized for detection and/or diagnosis of SARS-CoV-2 by FDA under an Emergency Use Authorization (EUA). This EUA will remain  in effect (meaning this test can be used) for the duration of the COVID-19 declaration under Se ction 564(b)(1) of the Act, 21 U.S.C. section 360bbb-3(b)(1), unless the authorization is terminated or revoked sooner.  Performed at [facility], [place] [address] [place], [place] [address]    Time spent: 30 min  SIGNED:   Marylu Lund, MD  [facility] 04/10/2020, 2:34 PM  If 7PM-7AM, please contact night-coverage

## 2020-04-10 NOTE — Progress Notes (Signed)
Pt being discharged to home with daughter. Discharge instructions and medication education provided to pt.  

## 2020-04-10 NOTE — TOC Progression Note (Addendum)
Transition of Care [facility]) - Progression Note    Patient Details  Name: Jordan Hoffman MRN: [id number] Date of Birth: August 22, 1928  [facility] CM/SW Contact  Joaquin Courts, RN Phone Number: 04/10/2020, 2:50 PM  Clinical Narrative:    CM spoke with patient and discussed need for HHPT services.  Patient is in agreement.  [facility] rep Amy given referral.   Expected Discharge Plan: [place] Barriers to Discharge: No Barriers Identified  Expected Discharge Plan and Services Expected Discharge Plan: [place]   Discharge Planning Services: CM Consult Post Acute Care Choice: [place] arrangements for the past 2 months: Single Family Home Expected Discharge Date: 04/10/20                         HH Arranged: PT HH Agency: [facility] Date [place]: 04/10/20 Time [place]: 1450 Representative spoke with at [place]: Amy   Social Determinants of Health ([place]) Interventions    Readmission Risk Interventions No flowsheet data found.

## 2020-07-23 ENCOUNTER — Ambulatory Visit: Payer: No Typology Code available for payment source | Attending: Internal Medicine

## 2020-07-23 DIAGNOSIS — Z23 Encounter for immunization: Secondary | ICD-10-CM

## 2020-07-23 NOTE — Progress Notes (Signed)
   Covid-19 Vaccination Clinic  Name:  Jordan Hoffman    MRN: [id number] DOB: 1928-02-27  07/23/2020  [name] was observed post Covid-19 immunization for 15 minutes without incident. He was provided with Vaccine Information Sheet and instruction to access the V-Safe system.   [name] was instructed to call 911 with any severe reactions post vaccine: Difficulty breathing  Swelling of face and throat  A fast heartbeat  A bad rash all over body  Dizziness and weakness   Immunizations Administered     Name Date Dose VIS Date Route   PFIZER Comrnaty(Gray TOP) Covid-19 Vaccine 07/23/2020  3:20 PM 0.3 mL 12/25/2019 Intramuscular   Manufacturer: [employer]   Lot: [id number]   [place]: [phone]

## 2020-07-28 ENCOUNTER — Other Ambulatory Visit: Payer: Self-pay

## 2020-07-28 ENCOUNTER — Encounter (HOSPITAL_COMMUNITY): Payer: Self-pay | Admitting: *Deleted

## 2020-07-28 ENCOUNTER — Inpatient Hospital Stay (HOSPITAL_COMMUNITY)
Admission: EM | Admit: 2020-07-28 | Discharge: 2020-07-31 | DRG: 194 | Disposition: A | Discharge status: Home / Self Care | Payer: No Typology Code available for payment source | Attending: Internal Medicine | Admitting: Internal Medicine

## 2020-07-28 ENCOUNTER — Emergency Department (HOSPITAL_COMMUNITY): Payer: No Typology Code available for payment source

## 2020-07-28 DIAGNOSIS — N179 Acute kidney failure, unspecified: Secondary | ICD-10-CM | POA: Diagnosis present

## 2020-07-28 DIAGNOSIS — G4733 Obstructive sleep apnea (adult) (pediatric): Secondary | ICD-10-CM | POA: Diagnosis present

## 2020-07-28 DIAGNOSIS — Z7951 Long term (current) use of inhaled steroids: Secondary | ICD-10-CM

## 2020-07-28 DIAGNOSIS — J440 Chronic obstructive pulmonary disease with acute lower respiratory infection: Secondary | ICD-10-CM | POA: Diagnosis present

## 2020-07-28 DIAGNOSIS — E7800 Pure hypercholesterolemia, unspecified: Secondary | ICD-10-CM | POA: Diagnosis present

## 2020-07-28 DIAGNOSIS — I480 Paroxysmal atrial fibrillation: Secondary | ICD-10-CM | POA: Diagnosis present

## 2020-07-28 DIAGNOSIS — Z20822 Contact with and (suspected) exposure to covid-19: Secondary | ICD-10-CM | POA: Diagnosis present

## 2020-07-28 DIAGNOSIS — Z841 Family history of disorders of kidney and ureter: Secondary | ICD-10-CM

## 2020-07-28 DIAGNOSIS — Z881 Allergy status to other antibiotic agents status: Secondary | ICD-10-CM

## 2020-07-28 DIAGNOSIS — Z888 Allergy status to other drugs, medicaments and biological substances status: Secondary | ICD-10-CM

## 2020-07-28 DIAGNOSIS — Z882 Allergy status to sulfonamides status: Secondary | ICD-10-CM

## 2020-07-28 DIAGNOSIS — Z96642 Presence of left artificial hip joint: Secondary | ICD-10-CM | POA: Diagnosis present

## 2020-07-28 DIAGNOSIS — Z96651 Presence of right artificial knee joint: Secondary | ICD-10-CM | POA: Diagnosis present

## 2020-07-28 DIAGNOSIS — Z885 Allergy status to narcotic agent status: Secondary | ICD-10-CM | POA: Diagnosis not present

## 2020-07-28 DIAGNOSIS — Z87891 Personal history of nicotine dependence: Secondary | ICD-10-CM

## 2020-07-28 DIAGNOSIS — I119 Hypertensive heart disease without heart failure: Secondary | ICD-10-CM | POA: Diagnosis present

## 2020-07-28 DIAGNOSIS — I4891 Unspecified atrial fibrillation: Secondary | ICD-10-CM | POA: Diagnosis present

## 2020-07-28 DIAGNOSIS — N184 Chronic kidney disease, stage 4 (severe): Secondary | ICD-10-CM | POA: Diagnosis present

## 2020-07-28 DIAGNOSIS — R0602 Shortness of breath: Secondary | ICD-10-CM

## 2020-07-28 DIAGNOSIS — Z823 Family history of stroke: Secondary | ICD-10-CM | POA: Diagnosis not present

## 2020-07-28 DIAGNOSIS — J159 Unspecified bacterial pneumonia: Secondary | ICD-10-CM | POA: Diagnosis present

## 2020-07-28 DIAGNOSIS — E785 Hyperlipidemia, unspecified: Secondary | ICD-10-CM | POA: Diagnosis present

## 2020-07-28 DIAGNOSIS — E1122 Type 2 diabetes mellitus with diabetic chronic kidney disease: Secondary | ICD-10-CM | POA: Diagnosis present

## 2020-07-28 DIAGNOSIS — Z801 Family history of malignant neoplasm of trachea, bronchus and lung: Secondary | ICD-10-CM | POA: Diagnosis not present

## 2020-07-28 DIAGNOSIS — Z880 Allergy status to penicillin: Secondary | ICD-10-CM

## 2020-07-28 DIAGNOSIS — R0902 Hypoxemia: Secondary | ICD-10-CM | POA: Diagnosis present

## 2020-07-28 DIAGNOSIS — Z7984 Long term (current) use of oral hypoglycemic drugs: Secondary | ICD-10-CM

## 2020-07-28 DIAGNOSIS — Z79899 Other long term (current) drug therapy: Secondary | ICD-10-CM

## 2020-07-28 DIAGNOSIS — E119 Type 2 diabetes mellitus without complications: Secondary | ICD-10-CM

## 2020-07-28 DIAGNOSIS — Z7901 Long term (current) use of anticoagulants: Secondary | ICD-10-CM

## 2020-07-28 DIAGNOSIS — I129 Hypertensive chronic kidney disease with stage 1 through stage 4 chronic kidney disease, or unspecified chronic kidney disease: Secondary | ICD-10-CM | POA: Diagnosis present

## 2020-07-28 DIAGNOSIS — N183 Chronic kidney disease, stage 3 unspecified: Secondary | ICD-10-CM | POA: Diagnosis present

## 2020-07-28 DIAGNOSIS — J449 Chronic obstructive pulmonary disease, unspecified: Secondary | ICD-10-CM | POA: Diagnosis present

## 2020-07-28 DIAGNOSIS — J189 Pneumonia, unspecified organism: Secondary | ICD-10-CM

## 2020-07-28 LAB — CREATININE, SERUM
Creatinine, Ser: 2.6 mg/dL — ABNORMAL HIGH (ref 0.61–1.24)
GFR, Estimated: 23 mL/min — ABNORMAL LOW (ref >60)

## 2020-07-28 LAB — LACTIC ACID, PLASMA
Lactic Acid, Venous: 1.5 mmol/L (ref 0.5–1.9)
Lactic Acid, Venous: 1.1 mmol/L (ref 0.5–1.9)

## 2020-07-28 LAB — CBC WITH DIFFERENTIAL/PLATELET
Abs Immature Granulocytes: 0.03 10*3/uL (ref 0.00–0.07)
Basophils Absolute: 0 10*3/uL (ref 0.0–0.1)
Basophils Relative: 0 %
Eosinophils Absolute: 0 10*3/uL (ref 0.0–0.5)
Eosinophils Relative: 0 %
HCT: 37.3 % — ABNORMAL LOW (ref 39.0–52.0)
Hemoglobin: 11.7 g/dL — ABNORMAL LOW (ref 13.0–17.0)
Immature Granulocytes: 0 %
Lymphocytes Relative: 8 %
Lymphs Abs: 0.7 10*3/uL (ref 0.7–4.0)
MCH: 27 pg (ref 26.0–34.0)
MCHC: 31.4 g/dL (ref 30.0–36.0)
MCV: 86.1 fL (ref 80.0–100.0)
Monocytes Absolute: 1.1 10*3/uL — ABNORMAL HIGH (ref 0.1–1.0)
Monocytes Relative: 14 %
Neutro Abs: 6.1 10*3/uL (ref 1.7–7.7)
Neutrophils Relative %: 78 %
Platelets: 251 10*3/uL (ref 150–400)
RBC: 4.33 MIL/uL (ref 4.22–5.81)
RDW: 15.5 % (ref 11.5–15.5)
WBC: 7.9 10*3/uL (ref 4.0–10.5)
nRBC: 0 % (ref 0.0–0.2)

## 2020-07-28 LAB — TROPONIN I (HIGH SENSITIVITY)
Troponin I (High Sensitivity): 29 ng/L — ABNORMAL HIGH (ref <18)
Troponin I (High Sensitivity): 28 ng/L — ABNORMAL HIGH (ref <18)

## 2020-07-28 LAB — COMPREHENSIVE METABOLIC PANEL
ALT: 19 U/L (ref 0–44)
AST: 21 U/L (ref 15–41)
Albumin: 3.6 g/dL (ref 3.5–5.0)
Alkaline Phosphatase: 86 U/L (ref 38–126)
Anion gap: 10 (ref 5–15)
BUN: 42 mg/dL — ABNORMAL HIGH (ref 8–23)
CO2: 26 mmol/L (ref 22–32)
Calcium: 10 mg/dL (ref 8.9–10.3)
Chloride: 104 mmol/L (ref 98–111)
Creatinine, Ser: 2.8 mg/dL — ABNORMAL HIGH (ref 0.61–1.24)
GFR, Estimated: 21 mL/min — ABNORMAL LOW (ref >60)
Glucose, Bld: 104 mg/dL — ABNORMAL HIGH (ref 70–99)
Potassium: 4.8 mmol/L (ref 3.5–5.1)
Sodium: 140 mmol/L (ref 135–145)
Total Bilirubin: 1.5 mg/dL — ABNORMAL HIGH (ref 0.3–1.2)
Total Protein: 7.6 g/dL (ref 6.5–8.1)

## 2020-07-28 LAB — RESP PANEL BY RT-PCR (FLU A&B, COVID) ARPGX2
Influenza A by PCR: NEGATIVE
Influenza B by PCR: NEGATIVE
SARS Coronavirus 2 by RT PCR: NEGATIVE

## 2020-07-28 LAB — LIPASE, BLOOD: Lipase: 21 U/L (ref 11–51)

## 2020-07-28 LAB — BRAIN NATRIURETIC PEPTIDE: B Natriuretic Peptide: 288 pg/mL — ABNORMAL HIGH (ref 0.0–100.0)

## 2020-07-28 MED ORDER — GUAIFENESIN ER 600 MG PO TB12
600.0000 mg | ORAL_TABLET | Freq: Two times a day (BID) | ORAL | Status: DC
  Administered 2020-07-29 – 2020-07-31 (×5): 600 mg via ORAL
  Filled 2020-07-28 (×5): qty 1

## 2020-07-28 MED ORDER — POLYVINYL ALCOHOL 1.4 % OP SOLN
1.0000 [drp] | Freq: Four times a day (QID) | OPHTHALMIC | Status: DC
  Administered 2020-07-29 – 2020-07-31 (×9): 1 [drp] via OPHTHALMIC
  Filled 2020-07-28: qty 15

## 2020-07-28 MED ORDER — ONDANSETRON HCL 4 MG/2ML IJ SOLN: 4.0000 mg | Freq: Four times a day (QID) | INTRAMUSCULAR | Status: DC | PRN

## 2020-07-28 MED ORDER — ACETAMINOPHEN 325 MG PO TABS: 650.0000 mg | ORAL_TABLET | Freq: Four times a day (QID) | ORAL | Status: DC | PRN

## 2020-07-28 MED ORDER — DIPHENHYDRAMINE HCL 25 MG PO CAPS: 25.0000 mg | ORAL_CAPSULE | Freq: Four times a day (QID) | ORAL | Status: DC | PRN

## 2020-07-28 MED ORDER — GABAPENTIN 300 MG PO CAPS
300.0000 mg | ORAL_CAPSULE | Freq: Two times a day (BID) | ORAL | Status: DC
  Administered 2020-07-29 – 2020-07-31 (×5): 300 mg via ORAL
  Filled 2020-07-28 (×5): qty 1

## 2020-07-28 MED ORDER — AMIODARONE HCL 200 MG PO TABS
200.0000 mg | ORAL_TABLET | Freq: Every day | ORAL | Status: DC
  Administered 2020-07-29 – 2020-07-31 (×3): 200 mg via ORAL
  Filled 2020-07-28 (×4): qty 1

## 2020-07-28 MED ORDER — ACETAMINOPHEN 650 MG RE SUPP: 650.0000 mg | Freq: Four times a day (QID) | RECTAL | Status: DC | PRN

## 2020-07-28 MED ORDER — SALINE SPRAY 0.65 % NA SOLN: 2.0000 | Freq: Two times a day (BID) | NASAL | Status: DC | PRN

## 2020-07-28 MED ORDER — SODIUM CHLORIDE 0.9 % IV SOLN: INTRAVENOUS | Status: DC

## 2020-07-28 MED ORDER — FLUTICASONE PROPIONATE 50 MCG/ACT NA SUSP: 2.0000 | Freq: Every day | NASAL | Status: DC | PRN

## 2020-07-28 MED ORDER — ENOXAPARIN SODIUM 30 MG/0.3ML IJ SOSY: 30.0000 mg | PREFILLED_SYRINGE | INTRAMUSCULAR | Status: DC

## 2020-07-28 MED ORDER — PANTOPRAZOLE SODIUM 40 MG PO TBEC
40.0000 mg | DELAYED_RELEASE_TABLET | Freq: Every day | ORAL | Status: DC
  Administered 2020-07-29 – 2020-07-31 (×3): 40 mg via ORAL
  Filled 2020-07-28 (×3): qty 1

## 2020-07-28 MED ORDER — CALCIUM CARBONATE-VITAMIN D 500-200 MG-UNIT PO TABS
1.0000 | ORAL_TABLET | Freq: Two times a day (BID) | ORAL | Status: DC
  Administered 2020-07-29 – 2020-07-31 (×5): 1 via ORAL
  Filled 2020-07-28 (×5): qty 1

## 2020-07-28 MED ORDER — SODIUM CHLORIDE 0.9 % IV SOLN
2.0000 g | INTRAVENOUS | Status: DC
  Administered 2020-07-28 – 2020-07-30 (×3): 2 g via INTRAVENOUS
  Filled 2020-07-28: qty 2
  Filled 2020-07-28: qty 20
  Filled 2020-07-28: qty 2

## 2020-07-28 MED ORDER — LORATADINE 10 MG PO TABS
10.0000 mg | ORAL_TABLET | Freq: Every day | ORAL | Status: DC
  Administered 2020-07-29 – 2020-07-31 (×3): 10 mg via ORAL
  Filled 2020-07-28 (×3): qty 1

## 2020-07-28 MED ORDER — ACETAMINOPHEN 325 MG PO TABS
650.0000 mg | ORAL_TABLET | Freq: Two times a day (BID) | ORAL | Status: DC
  Administered 2020-07-29 – 2020-07-31 (×5): 650 mg via ORAL
  Filled 2020-07-28 (×5): qty 2

## 2020-07-28 MED ORDER — ONDANSETRON HCL 4 MG PO TABS: 4.0000 mg | ORAL_TABLET | Freq: Four times a day (QID) | ORAL | Status: DC | PRN

## 2020-07-28 MED ORDER — UMECLIDINIUM BROMIDE 62.5 MCG/INH IN AEPB
1.0000 | INHALATION_SPRAY | Freq: Every day | RESPIRATORY_TRACT | Status: DC
  Administered 2020-07-29 – 2020-07-31 (×3): 1 via RESPIRATORY_TRACT
  Filled 2020-07-28 (×2): qty 7

## 2020-07-28 MED ORDER — THERA-GESIC 1-15 % EX CREA: TOPICAL_CREAM | Freq: Four times a day (QID) | CUTANEOUS | Status: DC | PRN

## 2020-07-28 MED ORDER — APIXABAN 5 MG PO TABS: 5.0000 mg | ORAL_TABLET | Freq: Two times a day (BID) | ORAL | Status: DC

## 2020-07-28 MED ORDER — ALBUTEROL SULFATE (2.5 MG/3ML) 0.083% IN NEBU: 2.5000 mg | INHALATION_SOLUTION | Freq: Four times a day (QID) | RESPIRATORY_TRACT | Status: DC | PRN

## 2020-07-28 MED ORDER — INSULIN ASPART 100 UNIT/ML IJ SOLN
0.0000 [IU] | Freq: Three times a day (TID) | INTRAMUSCULAR | Status: DC
  Administered 2020-07-30 (×2): 2 [IU] via SUBCUTANEOUS

## 2020-07-28 MED ORDER — MAGNESIUM CITRATE PO SOLN: 1.0000 | Freq: Once | ORAL | Status: DC | PRN

## 2020-07-28 MED ORDER — MUSCLE RUB 10-15 % EX CREA: TOPICAL_CREAM | Freq: Four times a day (QID) | CUTANEOUS | Status: DC | PRN

## 2020-07-28 MED ORDER — HEPARIN SODIUM (PORCINE) 5000 UNIT/ML IJ SOLN
5000.0000 [IU] | Freq: Three times a day (TID) | INTRAMUSCULAR | Status: DC
  Administered 2020-07-28: 5000 [IU] via SUBCUTANEOUS
  Filled 2020-07-28: qty 1

## 2020-07-28 MED ORDER — TIOTROPIUM BROMIDE MONOHYDRATE 2.5 MCG/ACT IN AERS: 2.0000 | INHALATION_SPRAY | Freq: Every day | RESPIRATORY_TRACT | Status: DC

## 2020-07-28 MED ORDER — LATANOPROST 0.005 % OP SOLN
1.0000 [drp] | Freq: Every day | OPHTHALMIC | Status: DC
  Administered 2020-07-29 – 2020-07-30 (×2): 1 [drp] via OPHTHALMIC
  Filled 2020-07-28: qty 2.5

## 2020-07-28 MED ORDER — PSYLLIUM 95 % PO PACK
1.0000 | PACK | Freq: Every day | ORAL | Status: DC
  Administered 2020-07-29 – 2020-07-31 (×3): 1 via ORAL
  Filled 2020-07-28 (×3): qty 1

## 2020-07-28 MED ORDER — AMLODIPINE BESYLATE 10 MG PO TABS
10.0000 mg | ORAL_TABLET | Freq: Every day | ORAL | Status: DC
  Administered 2020-07-31: 10 mg via ORAL
  Filled 2020-07-28 (×3): qty 1

## 2020-07-28 MED ORDER — FLUTICASONE FUROATE-VILANTEROL 200-25 MCG/INH IN AEPB
1.0000 | INHALATION_SPRAY | Freq: Every day | RESPIRATORY_TRACT | Status: DC
  Administered 2020-07-29 – 2020-07-31 (×3): 1 via RESPIRATORY_TRACT
  Filled 2020-07-28 (×2): qty 28

## 2020-07-28 MED ORDER — VITAMIN D3 25 MCG (1000 UNIT) PO TABS
1000.0000 [IU] | ORAL_TABLET | Freq: Every day | ORAL | Status: DC
  Administered 2020-07-29 – 2020-07-31 (×3): 1000 [IU] via ORAL
  Filled 2020-07-28 (×3): qty 1

## 2020-07-28 MED ORDER — SODIUM CHLORIDE 0.9 % IV SOLN
100.0000 mg | Freq: Two times a day (BID) | INTRAVENOUS | Status: DC
  Administered 2020-07-28 – 2020-07-30 (×4): 100 mg via INTRAVENOUS
  Filled 2020-07-28 (×5): qty 100

## 2020-07-28 MED ORDER — SENNOSIDES-DOCUSATE SODIUM 8.6-50 MG PO TABS: 1.0000 | ORAL_TABLET | Freq: Every evening | ORAL | Status: DC | PRN

## 2020-07-28 MED ORDER — FUROSEMIDE 20 MG PO TABS
20.0000 mg | ORAL_TABLET | Freq: Every day | ORAL | Status: DC
  Filled 2020-07-28: qty 1

## 2020-07-28 MED ORDER — BISACODYL 5 MG PO TBEC: 5.0000 mg | DELAYED_RELEASE_TABLET | Freq: Every day | ORAL | Status: DC | PRN

## 2020-07-28 NOTE — ED Provider Notes (Signed)
[facility] DEPT Provider Note   CSN: [id number] Arrival date & time: 07/28/20  1038     History Chief Complaint  Patient presents with   Shortness of Breath   Fever    Jordan Hoffman is a 87 y.o. male.  The history is provided by the patient, the spouse and medical records. No language interpreter was used.  Shortness of Breath Severity:  Moderate Onset quality:  Gradual Duration:  4 days Timing:  Constant Progression:  Waxing and waning Chronicity:  New Context: URI   Relieved by:  Nothing Worsened by:  Exertion Ineffective treatments:  None tried Associated symptoms: cough, fever and sputum production   Associated symptoms: no abdominal pain, no chest pain, no diaphoresis, no headaches, no rash, no vomiting and no wheezing   Fever Associated symptoms: chills and cough   Associated symptoms: no chest pain, no confusion, no congestion, no diarrhea, no dysuria, no headaches, no nausea, no rash and no vomiting       Past Medical History:  Diagnosis Date   Atrial fibrillation (HCC)    Back pain    COPD (chronic obstructive pulmonary disease) (HCC)    Diabetes mellitus without complication (HCC)    High cholesterol    Hypertension    Knee pain     Patient Active Problem List   Diagnosis Date Noted   Paroxysmal atrial fibrillation ([place]) 04/09/2020   ARF (acute renal failure) ([place]) 04/09/2020   Acute renal failure superimposed on stage 3 chronic kidney disease, unspecified acute renal failure type, unspecified whether stage 3a or 3b CKD ([place]) 04/08/2020   Near syncope 12/11/2015   Atrial fibrillation ([place]) 12/11/2015   COPD (chronic obstructive pulmonary disease) ([place]) 12/11/2015   OSA (obstructive sleep apnea) 12/11/2015   Hypertension 12/11/2015   Type 2 diabetes mellitus ([place]) 12/11/2015   Hyperlipidemia 12/11/2015   Lumbosacral spondylosis without myelopathy 10/11/2015   Osteoarthritis 06/21/2015    Past Surgical History:   Procedure Laterality Date   BACK SURGERY     HIP SURGERY     MOUTH SURGERY     REPLACEMENT TOTAL KNEE Right    TRANSURETHRAL RESECTION OF PROSTATE         No family history on file.  Social History   Tobacco Use   Smoking status: Former    Pack years: 0.00   Smokeless tobacco: Never  Vaping Use   Vaping Use: Never used  Substance Use Topics   Alcohol use: No   Drug use: No    Home Medications Prior to Admission medications   Medication Sig Start Date End Date Taking? Authorizing Provider  acetaminophen (TYLENOL) 325 MG tablet Take 975 mg by mouth 2 (two) times daily.    [provider]  albuterol (PROVENTIL) (2.5 MG/3ML) 0.083% nebulizer solution Take 2.5 mg by nebulization every 6 (six) hours as needed for wheezing or shortness of breath.    [provider]  albuterol (VENTOLIN HFA) 108 (90 Base) MCG/ACT inhaler Inhale 2 puffs into the lungs every 6 (six) hours as needed for wheezing or shortness of breath.    [provider]  amiodarone (PACERONE) 200 MG tablet Take 200 mg by mouth daily. 02/11/20   [provider]  amLODipine (NORVASC) 10 MG tablet Take 10 mg by mouth daily.    [provider]  apixaban (ELIQUIS) 5 MG TABS tablet Take 5 mg by mouth every 12 (twelve) hours. 07/01/19   [provider]  Calcium Carbonate-Vitamin D3 600-400 MG-UNIT  TABS Take 1 tablet by mouth 2 (two) times daily.    [provider]  CALCIUM PO Take 1 tablet by mouth daily.    [provider]  carboxymethylcellulose (REFRESH PLUS) 0.5 % SOLN Place 1 drop into both eyes 4 (four) times daily. 08/04/19   [provider]  cholecalciferol (VITAMIN D) 25 MCG (1000 UNIT) tablet Take 1,000 Units by mouth daily.    [provider]  diphenhydrAMINE (BENADRYL) 25 MG tablet Take 25 mg by mouth every 6 (six) hours as needed for sleep.    [provider]  fluticasone (FLONASE) 50 MCG/ACT nasal spray Place 2 sprays  into both nostrils daily as needed for allergies or rhinitis.    [provider]  Fluticasone-Salmeterol (ADVAIR) 250-50 MCG/DOSE AEPB Inhale 1 puff into the lungs 2 (two) times daily.    [provider]  furosemide (LASIX) 40 MG tablet Take 20 mg by mouth daily at 6 (six) AM. 01/06/20   [provider]  gabapentin (NEURONTIN) 300 MG capsule Take 300 mg by mouth 2 (two) times daily.    [provider]  glipiZIDE (GLUCOTROL) 5 MG tablet Take 2.5 mg by mouth every evening. 05/18/19   [provider]  guaiFENesin (MUCINEX) 600 MG 12 hr tablet Take 600 mg by mouth 2 (two) times daily.    [provider]  latanoprost (XALATAN) 0.005 % ophthalmic solution Place 1 drop into both eyes at bedtime.    [provider]  loratadine (CLARITIN) 10 MG tablet Take 10 mg by mouth daily.    [provider]  Menthol-Methyl Salicylate (THERA-GESIC EX) Apply 1 application topically every 6 (six) hours as needed (pain AVOID FACE AND EYES - [place] HANDS AFTER USING).    [provider]  omeprazole (PRILOSEC) 20 MG capsule Take 20 mg by mouth daily.    [provider]  psyllium (METAMUCIL) 58.6 % powder Take 1 packet by mouth daily.    [provider]  Skin Protectants, Misc. (HYDROCERIN EX) Apply 1 application topically daily as needed (dry skin).    [provider]  sodium chloride (OCEAN) 0.65 % SOLN nasal spray Place 2 sprays into both nostrils 2 (two) times daily as needed for congestion.    [provider]  terbinafine (LAMISIL) 1 % cream Apply 1 application topically 2 (two) times daily as needed (between toes).    [provider]  Tiotropium Bromide Monohydrate 2.5 MCG/ACT AERS Inhale 2 puffs into the lungs daily.    [provider]  urea (CARMOL) 20 % cream Apply 1 application topically 2 (two) times daily as needed (dry skin on feet DO NOT USE BETWEEN TOES).    [provider]     Allergies    Atorvastatin, Ciprofloxacin, Codeine, Erythromycin, Flunisolide, Lisinopril, Loratadine, Niacin and related, Penicillins, Pravastatin, Simvastatin, Statins, Sulfa antibiotics, and Zantac [ranitidine]  Review of Systems   Review of Systems  Constitutional:  Positive for chills, fatigue and fever. Negative for diaphoresis.  HENT:  Negative for congestion.   Eyes:  Negative for visual disturbance.  Respiratory:  Positive for cough, sputum production, chest tightness and shortness of breath. Negative for wheezing.   Cardiovascular:  Negative for chest pain.  Gastrointestinal:  Negative for abdominal pain, constipation, diarrhea, nausea and vomiting.  Genitourinary:  Negative for dysuria, flank pain and frequency.  Musculoskeletal:  Negative for back pain.  Skin:  Negative for rash and wound.  Neurological:  Negative for light-headedness and headaches.  Psychiatric/Behavioral:  Negative for agitation and confusion.   All other systems reviewed and are negative.  Physical Exam Updated Vital Signs BP 116/68 (BP Location: Left Arm)   Pulse 87   Temp 98.9 F (37.2 C) (Oral)   Resp 16   SpO2 93%   Physical Exam Vitals and nursing note reviewed.  Constitutional:      General: He is not in acute distress.    Appearance: He is well-developed. He is not ill-appearing, toxic-appearing or diaphoretic.  HENT:     Head: Normocephalic and atraumatic.     Mouth/Throat:     Mouth: Mucous membranes are moist.  Eyes:     Conjunctiva/sclera: Conjunctivae normal.  Cardiovascular:     Rate and Rhythm: Normal rate and regular rhythm.     Heart sounds: No murmur heard. Pulmonary:     Effort: Pulmonary effort is normal. No respiratory distress.     Breath sounds: Rhonchi present. No wheezing or rales.  Chest:     Chest wall: No tenderness.  Abdominal:     Palpations: Abdomen is soft.     Tenderness: There is no abdominal tenderness.  Musculoskeletal:     Cervical back: Neck  supple.     Right lower leg: No tenderness. No edema.     Left lower leg: No tenderness. No edema.  Skin:    General: Skin is warm and dry.     Findings: No erythema.  Neurological:     General: No focal deficit present.     Mental Status: He is alert.  Psychiatric:        Mood and Affect: Mood normal.    ED Results / Procedures / Treatments   Labs (all labs ordered are listed, but only abnormal results are displayed) Labs Reviewed  CBC WITH DIFFERENTIAL/PLATELET - Abnormal; Notable for the following components:      Result Value   Hemoglobin 11.7 (*)    HCT 37.3 (*)    Monocytes Absolute 1.1 (*)    All other components within normal limits  COMPREHENSIVE METABOLIC PANEL - Abnormal; Notable for the following components:   Glucose, Bld 104 (*)    BUN 42 (*)    Creatinine, Ser 2.80 (*)    Total Bilirubin 1.5 (*)    GFR, Estimated 21 (*)    All other components within normal limits  BRAIN NATRIURETIC PEPTIDE - Abnormal; Notable for the following components:   B Natriuretic Peptide 288.0 (*)    All other components within normal limits  TROPONIN I (HIGH SENSITIVITY) - Abnormal; Notable for the following components:   Troponin I (High Sensitivity) 29 (*)    All other components within normal limits  TROPONIN I (HIGH SENSITIVITY) - Abnormal; Notable for the following components:   Troponin I (High Sensitivity) 28 (*)    All other components within normal limits  RESP PANEL BY RT-PCR (FLU A&B, COVID) ARPGX2  CULTURE, BLOOD (ROUTINE X 2)  CULTURE, BLOOD (ROUTINE X 2)  LACTIC ACID, PLASMA  LACTIC ACID, PLASMA  LIPASE, BLOOD  CREATININE, SERUM    EKG None  Radiology DG Chest 2 View  Result Date: 07/28/2020 CLINICAL DATA:  Show of breath, fever EXAM: CHEST - 2 VIEW COMPARISON:  04/08/2020 FINDINGS: Patchy opacity in the right upper lobe compatible with pneumonia. Mild elevation of the left hemidiaphragm is stable. Cardiomegaly. No effusions or acute bony abnormality.  IMPRESSION: Right upper lobe airspace opacity concerning for pneumonia. Cardiomegaly. Electronically Signed   By: Rolm Baptise M.D.  On: 07/28/2020 11:35    Procedures Procedures   CRITICAL CARE Performed by: Gwenyth Allegra Evah Rashid Total critical care time: 35 minutes Critical care time was exclusive of separately billable procedures and treating other patients. Critical care was necessary to treat or prevent imminent or life-threatening deterioration. Critical care was time spent personally by me on the following activities: development of treatment plan with patient and/or surrogate as well as nursing, discussions with consultants, evaluation of patient's response to treatment, examination of patient, obtaining history from patient or surrogate, ordering and performing treatments and interventions, ordering and review of laboratory studies, ordering and review of radiographic studies, pulse oximetry and re-evaluation of patient's condition.  Medications Ordered in ED Medications  cefTRIAXone (ROCEPHIN) 2 g in sodium chloride 0.9 % 100 mL IVPB (2 g Intravenous New Bag/Given 07/28/20 1558)  doxycycline (VIBRAMYCIN) 100 mg in sodium chloride 0.9 % 250 mL IVPB (has no administration in time range)  acetaminophen (TYLENOL) tablet 650 mg (has no administration in time range)    Or  acetaminophen (TYLENOL) suppository 650 mg (has no administration in time range)  senna-docusate (Senokot-S) tablet 1 tablet (has no administration in time range)  bisacodyl (DULCOLAX) EC tablet 5 mg (has no administration in time range)  magnesium citrate solution 1 Bottle (has no administration in time range)  ondansetron (ZOFRAN) tablet 4 mg (has no administration in time range)    Or  ondansetron (ZOFRAN) injection 4 mg (has no administration in time range)  enoxaparin (LOVENOX) injection 30 mg (has no administration in time range)    ED Course  I have reviewed the triage vital signs and the nursing  notes.  Pertinent labs & imaging results that were available during my care of the patient were reviewed by me and considered in my medical decision making (see chart for details).    MDM Rules/Calculators/A&P                          JALEAL VANDEBERG is a 87 y.o. male with a past medical history significant for A. fib on Eliquis therapy, COPD not on home oxygen, hyperlipidemia, hypertension, diabetes, and previous pneumonia who presents with several days of subjective fevers, chills, productive cough, fatigue, and worsening exertional shortness of breath.  Patient describes symptoms as worsening for the last few days and he is concerned he may have pneumonia again.  He denies any sick contacts in regards to [place] or influenza but he says that normally he can walk around without shortness of breath and he is getting very winded.  He denies any new leg pain or leg swelling.  Denies any change in urination or bowels.  Denies any nausea, vomiting, or diaphoresis.  He is concerned about his breathing.  On exam, patient does have rhonchi but no significant wheezing.  No rales.  Chest and back and abdomen nontender.  Legs nontender.  Good pulses in extremities.  On my initial evaluation, oxygen saturations around 90 to 94% on room air.  As the symptoms are primarily exertional, we will get a ambulatory pulse ox to see if he gets hypoxic.  Clinically I am also concerned about pneumonia given the rhonchi and description of symptoms with productive cough.  Will get a COVID test, screening labs, and imaging.  Patient had a chest x-ray showing evidence of right-sided pneumonia.  Will wait to see if he gets hypoxic however his port score was calculated as a 100 giving him an 8.2 to  9.3% mortality for his community associated pneumonia.  Anticipate admission for IV antibiotics given this risk score.  Will await COVID and flu test and will call for admission.  3:10 PM Work-up continue return.  COVID and flu  negative.  The patient was ambulated and oxygen dropped to 89 and he was more short of breath and tachycardic.  Given his port score of 100, his now transient hypoxia in the setting of his new pneumonia, do not feel he safe for discharge home.  Will give antibiotics and admit for further management of pneumonia.  Will speak to pharmacy as to best antibiotic choice given patient's allergies and intolerances.  Will admit to medicine   Final Clinical Impression(s) / ED Diagnoses Final diagnoses:  Community acquired pneumonia of right upper lobe of lung  Shortness of breath     Clinical Impression: 1. Community acquired pneumonia of right upper lobe of lung   2. Shortness of breath     Disposition: Admit  This note was prepared with assistance of Dragon voice recognition software. Occasional wrong-word or sound-a-like substitutions may have occurred due to the inherent limitations of voice recognition software.     Hula Tasso, Gwenyth Allegra, MD 07/28/20 [phone]

## 2020-07-28 NOTE — ED Notes (Signed)
Pt ambulated in room.  Pt felt SOB.  O2 sat between 89-91% RA and HR elevated.

## 2020-07-28 NOTE — Plan of Care (Signed)
Patient alert and oriented currently has mild fever. No c/o pain at this time. Non productive cough noted. Will continue to monitor

## 2020-07-28 NOTE — ED Triage Notes (Signed)
Pt complains of cough and shortness of breath with exertion and fever. Reports temp of 100.5 last night. Hx of COPD. Reports pain in left flank x 4 days.

## 2020-07-28 NOTE — H&P (Signed)
History and Physical    Jordan Hoffman [id number] DOB: [birth year] DOA: 07/28/2020  PCP: [facility] [place]  Patient coming from: Home  I have personally briefly reviewed patient's old medical records in [facility].  Chief Complaint: shortness of breath  HPI: Jordan Hoffman is a 87 y.o. male with medical history significant for atrial fibrillation, COPD, OSA, diabetes, who presents to the emergency department on 07/28/20 with shortness of breath. The patient has had shortness of breath beginning approximately 1 week before presentation and it has been progressively worsening. He reports a rattling in his chest x 2-3 days. He would get short of breath just walking to the next room. Symptoms are alleviated by nothing and exacerbated by walking. Associated symptoms: Chills. Coughing productive of light and dark sputum. Wheezing. No chest pain or palpitations.   ED Course: Patient was found to have pneumonia on x-ray. SARS-CoV-2 was negative. Patient was hypoxic with walking. He was started on IV antibiotics and oxygen by [place].  Review of Systems: As per HPI otherwise all other systems reviewed and are unremarable.  GI: No abdominal pain, nausea, vomiting, diarrhea, constipation, or bloody stool.  NEUROLOGICAL: No headache or focal weakness.   Past Medical History:  Diagnosis Date   Atrial fibrillation (HCC)    Back pain    COPD (chronic obstructive pulmonary disease) (HCC)    Diabetes mellitus without complication ([place])    High cholesterol    Hypertension    Knee pain     Past Surgical History:  Procedure Laterality Date   BACK SURGERY     HIP SURGERY     JOINT REPLACEMENT Left    left hip replacement   MOUTH SURGERY     REPLACEMENT TOTAL KNEE Right    TRANSURETHRAL RESECTION OF PROSTATE      Social History  reports that he has quit smoking. He has never used smokeless tobacco. He reports that he does not drink alcohol and does not use drugs.  Allergies  Allergen  Reactions   Lisinopril Cough        Penicillins Other (See Comments)    Did it involve swelling of the face/tongue/throat, SOB, or low BP? Yes Did it involve sudden or severe rash/hives, skin peeling, or any reaction on the inside of your mouth or nose? No Did you need to seek medical attention at a hospital or doctor's office? No When did it last happen? Unk  If all above answers are "NO", may proceed with cephalosporin use.   *pt tolerated ancef on 08/08/16    Pravastatin Hives        Sulfa Antibiotics Itching and Other (See Comments)    Itching, watery eyes   Atorvastatin Other (See Comments)    Muscle pain    Codeine Hives        Flunisolide Hives    epistaxis Other reaction(s): Bleeding from nose, Other (See Comments) Hives  Hives     Loratadine     Hives  Other reaction(s): Other (See Comments), Other (See Comments) Hives  Hives  Hives     Niacin And Related     Hives    Niaspan [Niacin] Hives and Other (See Comments)    Flushing, also   Simvastatin Hives    weakness Other reaction(s): Weakness present   Statins Hives    weakness Other reaction(s): Other (See Comments), Unknown Hives  weakness Hives  Hives  Hives     Ciprofloxacin Rash    Hives  Other  reaction(s): Other (See Comments), Other (See Comments) Hives  Hives  Hives     Erythromycin Rash    Hives  Other reaction(s): Other (See Comments), Other (See Comments) Hives  Hives  Hives     Ranitidine Rash    Hives  Other reaction(s): Other (See Comments), Other (See Comments) Hives  Hives  Hives      Family History  Problem Relation Age of Onset   Lung cancer Mother        Never smoked   Lung cancer Father    Stroke Sister    Lung cancer Brother    Kidney disease Brother    Heart attack Neg Hx      Home Medications  Prior to Admission medications   Medication Sig Start Date End Date Taking? Authorizing Provider  acetaminophen (TYLENOL) 325 MG tablet Take 975 mg by  mouth 2 (two) times daily.   Yes [provider]  albuterol (PROVENTIL) (2.5 MG/3ML) 0.083% nebulizer solution Take 2.5 mg by nebulization every 6 (six) hours as needed for wheezing or shortness of breath.   Yes [provider]  albuterol (VENTOLIN HFA) 108 (90 Base) MCG/ACT inhaler Inhale 2 puffs into the lungs every 6 (six) hours as needed for wheezing or shortness of breath.   Yes [provider]  amiodarone (PACERONE) 200 MG tablet Take 200 mg by mouth daily. 02/11/20  Yes [provider]  amLODipine (NORVASC) 10 MG tablet Take 10 mg by mouth daily.   Yes [provider]  apixaban (ELIQUIS) 5 MG TABS tablet Take 5 mg by mouth every 12 (twelve) hours. 07/01/19  Yes [provider]  Calcium Carbonate-Vitamin D3 600-400 MG-UNIT TABS Take 1 tablet by mouth 2 (two) times daily.   Yes [provider]  CALCIUM PO Take 1 tablet by mouth daily.   Yes [provider]  carboxymethylcellulose (REFRESH PLUS) 0.5 % SOLN Place 1 drop into both eyes 4 (four) times daily. 08/04/19  Yes [provider]  cholecalciferol (VITAMIN D) 25 MCG (1000 UNIT) tablet Take 1,000 Units by mouth daily.   Yes [provider]  diphenhydrAMINE (BENADRYL) 25 MG tablet Take 25 mg by mouth every 6 (six) hours as needed for sleep.   Yes [provider]  fluticasone (FLONASE) 50 MCG/ACT nasal spray Place 2 sprays into both nostrils daily as needed for allergies or rhinitis.   Yes [provider]  Fluticasone-Salmeterol (ADVAIR) 250-50 MCG/DOSE AEPB Inhale 1 puff into the lungs 2 (two) times daily.   Yes [provider]  furosemide (LASIX) 40 MG tablet Take 20 mg by mouth daily at 6 (six) AM. 01/06/20  Yes [provider]  gabapentin (NEURONTIN) 300 MG capsule Take 300 mg by mouth 2 (two) times daily.   Yes [provider]  glipiZIDE (GLUCOTROL) 5 MG tablet Take 2.5 mg by mouth every evening. 05/18/19  Yes  [provider]  guaiFENesin (MUCINEX) 600 MG 12 hr tablet Take 600 mg by mouth 2 (two) times daily.   Yes [provider]  latanoprost (XALATAN) 0.005 % ophthalmic solution Place 1 drop into both eyes at bedtime.   Yes [provider]  loratadine (CLARITIN) 10 MG tablet Take 10 mg by mouth daily.   Yes [provider]  Menthol-Methyl Salicylate (THERA-GESIC EX) Apply 1 application topically every 6 (six) hours as needed (pain AVOID FACE AND EYES - [place] HANDS AFTER USING).   Yes [provider]  omeprazole (PRILOSEC) 20 MG capsule Take  20 mg by mouth daily.   Yes [provider]  psyllium (METAMUCIL) 58.6 % powder Take 1 packet by mouth daily.   Yes [provider]  Skin Protectants, Misc. (HYDROCERIN EX) Apply 1 application topically daily as needed (dry skin).   Yes [provider]  sodium chloride (OCEAN) 0.65 % SOLN nasal spray Place 2 sprays into both nostrils 2 (two) times daily as needed for congestion.   Yes [provider]  terbinafine (LAMISIL) 1 % cream Apply 1 application topically 2 (two) times daily as needed (between toes).   Yes [provider]  Tiotropium Bromide Monohydrate 2.5 MCG/ACT AERS Inhale 2 puffs into the lungs daily.   Yes [provider]  urea (CARMOL) 20 % cream Apply 1 application topically 2 (two) times daily as needed (dry skin on feet DO NOT USE BETWEEN TOES).   Yes [provider]    Physical Exam: Vitals:   07/28/20 1741 07/28/20 1843 07/28/20 2004 07/28/20 2005  BP:  126/71 126/71   Pulse:  82 82   Resp:  20 20   Temp: 98.7 F (37.1 C) 99.4 F (37.4 C) 99.4 F (37.4 C)   TempSrc: Oral Oral Oral   SpO2:      Weight:    95.3 kg  Height:    '6\' 4"'$  (1.93 m)    Constitutional: NAD, calm, comfortable, ill-appearing. Vitals:   07/28/20 1741 07/28/20 1843 07/28/20 2004 07/28/20 2005  BP:  126/71 126/71   Pulse:  82 82   Resp:  20 20   Temp: 98.7  F (37.1 C) 99.4 F (37.4 C) 99.4 F (37.4 C)   TempSrc: Oral Oral Oral   SpO2:      Weight:    95.3 kg  Height:    '6\' 4"'$  (1.93 m)   Eyes: Pupils equal and round, lids and conjunctivae without icterus or erythema. ENMT: Mucous membranes are dry. Posterior pharynx clear of any exudate or lesions. Nares patent without discharge or bleeding.  Normocephalic, atraumatic.  Normal dentition.  Neck: normal, supple, no masses, trachea midline.  Thyroid nontender, no masses appreciated, no thyromegaly. Respiratory: clear to auscultation bilaterally. Chest wall movements are symmetric. No wheezing, no crackles.  Minimal coarse breath sounds.  Normal respiratory effort. No accessory muscle use.  Cardiovascular: Regular rate and rhythm, no murmurs / rubs / gallops. Pulses: DP pulses 2+ bilaterally. No carotid bruits.  Capillary refill less than 3 seconds. Edema: None bilaterally. GI: soft, non-distended, normal active bowel sounds. No hepatosplenomegaly. No rigidity, rebound, or guarding. Non-tender. No masses palpated.  Musculoskeletal: no clubbing / cyanosis. No joint deformity upper and lower extremities. Good ROM, no contractures. Normal muscle tone.  No tenderness or deformity in the back bilaterally. Integument: no rashes, lesions, ulcers. No induration. Clean, dry, intact. Neurologic: CN 2-12 grossly intact. Sensation grossly intact to light touch. DTR 2+ bilaterally.  Babinski: Toes downgoing bilaterally.  Strength 5/5 in all 4.  Intact rapid alternating movements bilaterally.  No pronator drift. Psychiatric: Normal judgment and insight. Alert and oriented x 3. Normal mood.  Normal and appropriate affect. Lymphatic: No cervical lymphadenopathy. No supraclavicular lymphadenopathy.   Labs on Admission: I have personally reviewed the following labs and imaging studies.  CBC: Recent Labs  Lab 07/28/20 1207  WBC 7.9  NEUTROABS 6.1  HGB 11.7*  HCT 37.3*  MCV 86.1  PLT 123XX123    Basic Metabolic  Panel: Recent Labs  Lab 07/28/20 1207 07/28/20 1407  NA 140  --  K 4.8  --   CL 104  --   CO2 26  --   GLUCOSE 104*  --   BUN 42*  --   CREATININE 2.80* 2.60*  CALCIUM 10.0  --     GFR: Estimated Creatinine Clearance: 23.2 mL/min (A) (by C-G formula based on SCr of 2.6 mg/dL (H)).  Liver Function Tests: Recent Labs  Lab 07/28/20 1207  AST 21  ALT 19  ALKPHOS 86  BILITOT 1.5*  PROT 7.6  ALBUMIN 3.6    Urine analysis:   Radiological Exams on Admission: DG Chest 2 View  Result Date: 07/28/2020 CLINICAL DATA:  Show of breath, fever EXAM: CHEST - 2 VIEW COMPARISON:  04/08/2020 FINDINGS: Patchy opacity in the right upper lobe compatible with pneumonia. Mild elevation of the left hemidiaphragm is stable. Cardiomegaly. No effusions or acute bony abnormality. IMPRESSION: Right upper lobe airspace opacity concerning for pneumonia. Cardiomegaly. Electronically Signed   By: Rolm Baptise M.D.   On: 07/28/2020 11:35    EKG: Independently reviewed. 76 bpm. Atrial fibrillation.  Assessment/Plan Principal Problem:   Bacterial pneumonia Plan: IV antibiotics. Continuous oxygen support.   Active Problems:    Hypoxia Plan: Oxygen by [place] and increase as needed.    Acute renal failure superimposed on stage 3 chronic kidney disease, unspecified acute renal failure type, unspecified whether stage 3a or 3b CKD (HCC) Chronic CKD likely due to diabetes. Plan: Check labs. Trial of IV fluids. Further evaluation if acute component does not resolve.    Type 2 diabetes mellitus, with chronic kidney disease stage 3, without long-term use of insulikn(HCC) Plan: Hold oral diabetes medications. Check fingerstick blood sugars. Sliding scale insulin.     Atrial fibrillation ([place]) Plan: Continue home medications and anticoagulation.    COPD (chronic obstructive pulmonary disease) ([place]) Plan: Neb treatments. Home inhalers.     OSA (obstructive sleep apnea) Plan: CPAP at night.    DVT  prophylaxis: Lovenox.  Code Status:   Full Code   Disposition Plan:   Patient is from:  Home  Anticipated DC to:  Home  Anticipated DC date:  07/31/20  Anticipated DC barriers: need for oxygen support  Consults called:  None  Admission status:  Inpatient   Severity of Illness: The appropriate patient status for this patient is OBSERVATION. Observation status is judged to be reasonable and necessary in order to provide the required intensity of service to ensure the patient's safety. The patient's presenting symptoms, physical exam findings, and initial radiographic and laboratory data in the context of their medical condition is felt to place them at decreased risk for further clinical deterioration. Furthermore, it is anticipated that the patient will be medically stable for discharge from the hospital within 2 midnights of admission. The following factors support the patient status of observation.   " The patient's presenting symptoms include shortness of breath.. " The physical exam findings include coarse breath sounds. Patient requiring oxygen. Was hypoxic while walking. " The initial radiographic and laboratory data are notable for CXR with pneumonia and negative Covid PCR test..    Tacey Ruiz MD [facility]  How to contact the [facility] Attending or Consulting provider [place] or covering provider during after hours [place], for this patient?   Check the care team in [facility] and look for a) attending/consulting [facility] provider listed and b) the [facility] team listed Log into [email or web address] and use [place]'s universal password to access. If you do not have the password, please contact  the hospital operator. Locate the [facility] provider you are looking for under [facility] and page to a number that you can be directly reached. If you still have difficulty reaching the provider, please page the [facility] (Director on Call) for the Hospitalists listed on amion for assistance.  07/28/2020, 9:57  PM

## 2020-07-29 DIAGNOSIS — I480 Paroxysmal atrial fibrillation: Secondary | ICD-10-CM

## 2020-07-29 DIAGNOSIS — N184 Chronic kidney disease, stage 4 (severe): Secondary | ICD-10-CM

## 2020-07-29 DIAGNOSIS — N179 Acute kidney failure, unspecified: Secondary | ICD-10-CM

## 2020-07-29 DIAGNOSIS — J159 Unspecified bacterial pneumonia: Principal | ICD-10-CM

## 2020-07-29 LAB — GLUCOSE, CAPILLARY
Glucose-Capillary: 85 mg/dL (ref 70–99)
Glucose-Capillary: 105 mg/dL — ABNORMAL HIGH (ref 70–99)
Glucose-Capillary: 117 mg/dL — ABNORMAL HIGH (ref 70–99)
Glucose-Capillary: 128 mg/dL — ABNORMAL HIGH (ref 70–99)

## 2020-07-29 LAB — BASIC METABOLIC PANEL
Anion gap: 8 (ref 5–15)
BUN: 37 mg/dL — ABNORMAL HIGH (ref 8–23)
CO2: 24 mmol/L (ref 22–32)
Calcium: 9.2 mg/dL (ref 8.9–10.3)
Chloride: 106 mmol/L (ref 98–111)
Creatinine, Ser: 2.54 mg/dL — ABNORMAL HIGH (ref 0.61–1.24)
GFR, Estimated: 23 mL/min — ABNORMAL LOW (ref >60)
Glucose, Bld: 85 mg/dL (ref 70–99)
Potassium: 4.5 mmol/L (ref 3.5–5.1)
Sodium: 138 mmol/L (ref 135–145)

## 2020-07-29 LAB — CBC
HCT: 32.1 % — ABNORMAL LOW (ref 39.0–52.0)
Hemoglobin: 10.1 g/dL — ABNORMAL LOW (ref 13.0–17.0)
MCH: 27 pg (ref 26.0–34.0)
MCHC: 31.5 g/dL (ref 30.0–36.0)
MCV: 85.8 fL (ref 80.0–100.0)
Platelets: 256 10*3/uL (ref 150–400)
RBC: 3.74 MIL/uL — ABNORMAL LOW (ref 4.22–5.81)
RDW: 15.6 % — ABNORMAL HIGH (ref 11.5–15.5)
WBC: 6.9 10*3/uL (ref 4.0–10.5)
nRBC: 0 % (ref 0.0–0.2)

## 2020-07-29 LAB — PROTEIN / CREATININE RATIO, URINE
Creatinine, Urine: 91.68 mg/dL
Protein Creatinine Ratio: 0.22 mg/mg{Cre} — ABNORMAL HIGH (ref 0.00–0.15)
Total Protein, Urine: 20 mg/dL

## 2020-07-29 LAB — HEMOGLOBIN A1C
Hgb A1c MFr Bld: 6.3 % — ABNORMAL HIGH (ref 4.8–5.6)
Mean Plasma Glucose: 134.11 mg/dL

## 2020-07-29 LAB — CREATININE, URINE, RANDOM: Creatinine, Urine: 91.63 mg/dL

## 2020-07-29 LAB — SODIUM, URINE, RANDOM: Sodium, Ur: 93 mmol/L

## 2020-07-29 MED ORDER — APIXABAN 2.5 MG PO TABS
2.5000 mg | ORAL_TABLET | Freq: Two times a day (BID) | ORAL | Status: DC
  Administered 2020-07-29 – 2020-07-31 (×5): 2.5 mg via ORAL
  Filled 2020-07-29 (×5): qty 1

## 2020-07-29 NOTE — Plan of Care (Signed)
  Problem: Clinical Measurements: Goal: Ability to maintain a body temperature in the normal range will improve Outcome: Progressing   

## 2020-07-29 NOTE — Progress Notes (Signed)
Patient ID: Jordan Hoffman, male   DOB: 1928/12/26, 87 y.o.   MRN: [id number]  PROGRESS NOTE    ISSACHAR HAWRYLUK  [id number] DOB: [birth year] DOA: 07/28/2020 PCP: [facility] [place]   Brief Narrative:  87 year old male with history of atrial fibrillation, COPD, OSA, diabetes mellitus type 2 presented with worsening shortness of breath.  On presentation, he was found to have pneumonia on chest x-ray.  COVID testing was negative.  He was started on IV antibiotics.  Assessment & Plan:   Right upper lobar bacterial pneumonia -Chest x-ray showed right upper lobe pneumonia.  Currently on room air. -Continue Rocephin and doxycycline.  Possible switch to oral antibiotics in 1 to 2 days depending on clinical improvement -Influenza and COVID-19 test were negative on presentation.  Follow cultures.  Acute kidney injury on chronic any disease stage IV -Baseline creatinine around 2.1-2.4.  Presented with creatinine of 2.8.  Treated with IV fluids.  Improving; creatinine 2.54 today.  Diabetes mellitus type II -A1c 6.3.  Continue CBGs with SSI.  Hypertension -Monitor blood pressure.  Continue amlodipine.  Hold Lasix for today.  COPD From stable.  Continue current inhaled regimen  Paroxysmal A. fib -Continue amiodarone and Eliquis  OSA -Continue CPAP at night  DVT prophylaxis: Eliquis Code Status: Full Family Communication: None at bedside Disposition Plan: Status is: Inpatient  Remains inpatient appropriate because:Inpatient level of care appropriate due to severity of illness  Dispo: The patient is from: Home              Anticipated d/c is to: Home              Patient currently is not medically stable to d/c.   Difficult to place patient No  Consultants: None  Procedures: None  Antimicrobials: Rocephin and doxycycline from 07/28/2020 onwards   Subjective: Patient seen and examined at bedside.  He feels slightly better.  Breathing is improving.  Still feels short of breath with  exertion.  No overnight fever or vomiting reported.  Objective: Vitals:   07/29/20 0556 07/29/20 0830 07/29/20 1104 07/29/20 1220  BP: (!) 108/57 104/69  113/70  Pulse: 68 73    Resp: 18     Temp: 98.7 F (37.1 C)     TempSrc: Oral     SpO2: 93%  92%   Weight:      Height:        Intake/Output Summary (Last 24 hours) at 07/29/2020 1301 Last data filed at 07/29/2020 0300 Gross per 24 hour  Intake 783.33 ml  Output 600 ml  Net 183.33 ml   Filed Weights   07/28/20 2005 07/29/20 0453  Weight: 95.3 kg 96.4 kg    Examination:  General exam: Appears calm and comfortable.  Elderly male sitting on chair.  On room air. Respiratory system: Bilateral decreased breath sounds at bases with some scattered crackles Cardiovascular system: S1 & S2 heard, Rate controlled Gastrointestinal system: Abdomen is nondistended, soft and nontender. Normal bowel sounds heard. Extremities: No cyanosis, clubbing, edema  Central nervous system: Alert and oriented. No focal neurological deficits. Moving extremities Skin: No rashes, lesions or ulcers Psychiatry: Judgement and insight appear normal. Mood & affect appropriate.     Data Reviewed: I have personally reviewed following labs and imaging studies  CBC: Recent Labs  Lab 07/28/20 1207 07/29/20 0454  WBC 7.9 6.9  NEUTROABS 6.1  --   HGB 11.7* 10.1*  HCT 37.3* 32.1*  MCV 86.1 85.8  PLT 251 256  Basic Metabolic Panel: Recent Labs  Lab 07/28/20 1207 07/28/20 1407 07/29/20 0454  NA 140  --  138  K 4.8  --  4.5  CL 104  --  106  CO2 26  --  24  GLUCOSE 104*  --  85  BUN 42*  --  37*  CREATININE 2.80* 2.60* 2.54*  CALCIUM 10.0  --  9.2   GFR: Estimated Creatinine Clearance: 23.7 mL/min (A) (by C-G formula based on SCr of 2.54 mg/dL (H)). Liver Function Tests: Recent Labs  Lab 07/28/20 1207  AST 21  ALT 19  ALKPHOS 86  BILITOT 1.5*  PROT 7.6  ALBUMIN 3.6   Recent Labs  Lab 07/28/20 1207  LIPASE 21   No results for  input(s): AMMONIA in the last 168 hours. Coagulation Profile: No results for input(s): INR, PROTIME in the last 168 hours. Cardiac Enzymes: No results for input(s): CKTOTAL, CKMB, CKMBINDEX, TROPONINI in the last 168 hours. BNP (last 3 results) No results for input(s): PROBNP in the last 8760 hours. HbA1C: Recent Labs    07/29/20 0454  HGBA1C 6.3*   CBG: Recent Labs  Lab 07/29/20 0738 07/29/20 1212  GLUCAP 85 105*   Lipid Profile: No results for input(s): CHOL, HDL, LDLCALC, TRIG, CHOLHDL, LDLDIRECT in the last 72 hours. Thyroid Function Tests: No results for input(s): TSH, T4TOTAL, FREET4, T3FREE, THYROIDAB in the last 72 hours. Anemia Panel: No results for input(s): VITAMINB12, FOLATE, FERRITIN, TIBC, IRON, RETICCTPCT in the last 72 hours. Sepsis Labs: Recent Labs  Lab 07/28/20 1207 07/28/20 1407  LATICACIDVEN 1.5 1.1    Recent Results (from the past 240 hour(s))  Blood culture (routine x 2)     Status: None (Preliminary result)   Collection Time: 07/28/20 12:07 PM   Specimen: BLOOD  Result Value Ref Range Status   Specimen Description   Final    BLOOD LEFT ANTECUBITAL Performed at [facility] [address] [place], [place] [address]    Special Requests   Final    BOTTLES DRAWN AEROBIC AND ANAEROBIC Blood Culture adequate volume Performed at [facility] [address], [place], [place] [address]    Culture   Final    NO GROWTH < 24 HOURS Performed at [facility] [address], [place], [place] [address]    Report Status PENDING  Incomplete  Resp Panel by RT-PCR (Flu A&B, Covid) Nasopharyngeal Swab     Status: None   Collection Time: 07/28/20 12:07 PM   Specimen: Nasopharyngeal Swab; Nasopharyngeal(NP) swabs in vial transport medium  Result Value Ref Range Status   SARS Coronavirus 2 by RT PCR NEGATIVE NEGATIVE Final    Comment: (NOTE) SARS-CoV-2 target nucleic acids are NOT DETECTED.  The SARS-CoV-2 RNA  is generally detectable in upper respiratory specimens during the acute phase of infection. The lowest concentration of SARS-CoV-2 viral copies this assay can detect is 138 copies/mL. A negative result does not preclude SARS-Cov-2 infection and should not be used as the sole basis for treatment or other patient management decisions. A negative result may occur with  improper specimen collection/handling, submission of specimen other than nasopharyngeal swab, presence of viral mutation(s) within the areas targeted by this assay, and inadequate number of viral copies(<138 copies/mL). A negative result must be combined with clinical observations, patient history, and epidemiological information. The expected result is Negative.  Fact Sheet for Patients:  EntrepreneurPulse.com.au  Fact Sheet for Healthcare Providers:  IncredibleEmployment.be  This test is  no t yet approved or cleared by the [place] and  has been authorized for detection and/or diagnosis of SARS-CoV-2 by FDA under an Emergency Use Authorization (EUA). This EUA will remain  in effect (meaning this test can be used) for the duration of the COVID-19 declaration under Section 564(b)(1) of the Act, 21 U.S.C.section 360bbb-3(b)(1), unless the authorization is terminated  or revoked sooner.       Influenza A by PCR NEGATIVE NEGATIVE Final   Influenza B by PCR NEGATIVE NEGATIVE Final    Comment: (NOTE) The Xpert Xpress SARS-CoV-2/FLU/RSV plus assay is intended as an aid in the diagnosis of influenza from Nasopharyngeal swab specimens and should not be used as a sole basis for treatment. Nasal washings and aspirates are unacceptable for Xpert Xpress SARS-CoV-2/FLU/RSV testing.  Fact Sheet for Patients: EntrepreneurPulse.com.au  Fact Sheet for Healthcare Providers: IncredibleEmployment.be  This test is not yet approved or cleared by the  [place] FDA and has been authorized for detection and/or diagnosis of SARS-CoV-2 by FDA under an Emergency Use Authorization (EUA). This EUA will remain in effect (meaning this test can be used) for the duration of the COVID-19 declaration under Section 564(b)(1) of the Act, 21 U.S.C. section 360bbb-3(b)(1), unless the authorization is terminated or revoked.  Performed at [facility], [place] [address], [place], [place] [address]   Blood culture (routine x 2)     Status: None (Preliminary result)   Collection Time: 07/28/20 12:12 PM   Specimen: BLOOD  Result Value Ref Range Status   Specimen Description   Final    BLOOD BLOOD LEFT HAND Performed at [facility] [address], [place], [place] [address]    Special Requests   Final    BOTTLES DRAWN AEROBIC AND ANAEROBIC Blood Culture adequate volume Performed at [facility] [address], [place], [place] [address]    Culture   Final    NO GROWTH < 24 HOURS Performed at [facility] [address], [place],  [address]    Report Status PENDING  Incomplete         Radiology Studies: DG Chest 2 View  Result Date: 07/28/2020 CLINICAL DATA:  Show of breath, fever EXAM: CHEST - 2 VIEW COMPARISON:  04/08/2020 FINDINGS: Patchy opacity in the right upper lobe compatible with pneumonia. Mild elevation of the left hemidiaphragm is stable. Cardiomegaly. No effusions or acute bony abnormality. IMPRESSION: Right upper lobe airspace opacity concerning for pneumonia. Cardiomegaly. Electronically Signed   By: Rolm Baptise M.D.   On: 07/28/2020 11:35        Scheduled Meds:  acetaminophen  650 mg Oral BID   amiodarone  200 mg Oral Daily   amLODipine  10 mg Oral Daily   apixaban  2.5 mg Oral BID   calcium-vitamin D  1 tablet Oral BID   cholecalciferol  1,000 Units Oral Daily   fluticasone furoate-vilanterol  1 puff Inhalation Daily   furosemide  20 mg Oral  Q0600   gabapentin  300 mg Oral BID   guaiFENesin  600 mg Oral BID   insulin aspart  0-15 Units Subcutaneous TID WC   latanoprost  1 drop Both Eyes QHS   loratadine  10 mg Oral Daily   pantoprazole  40 mg Oral Daily   polyvinyl alcohol  1 drop Both Eyes QID   psyllium  1 packet Oral Daily   umeclidinium bromide  1 puff Inhalation Daily   Continuous  Infusions:  cefTRIAXone (ROCEPHIN)  IV Stopped (07/28/20 1638)   doxycycline (VIBRAMYCIN) IV 100 mg (07/29/20 1238)          [name], MD [facility] 07/29/2020, 1:01 PM

## 2020-07-30 ENCOUNTER — Other Ambulatory Visit (HOSPITAL_BASED_OUTPATIENT_CLINIC_OR_DEPARTMENT_OTHER): Payer: Self-pay

## 2020-07-30 LAB — CBC WITH DIFFERENTIAL/PLATELET
Abs Immature Granulocytes: 0.02 K/uL (ref 0.00–0.07)
Basophils Absolute: 0 K/uL (ref 0.0–0.1)
Basophils Relative: 1 %
Eosinophils Absolute: 0.1 K/uL (ref 0.0–0.5)
Eosinophils Relative: 2 %
HCT: 33.7 % — ABNORMAL LOW (ref 39.0–52.0)
Hemoglobin: 10.4 g/dL — ABNORMAL LOW (ref 13.0–17.0)
Immature Granulocytes: 0 %
Lymphocytes Relative: 15 %
Lymphs Abs: 0.9 K/uL (ref 0.7–4.0)
MCH: 26.9 pg (ref 26.0–34.0)
MCHC: 30.9 g/dL (ref 30.0–36.0)
MCV: 87.3 fL (ref 80.0–100.0)
Monocytes Absolute: 1.1 K/uL — ABNORMAL HIGH (ref 0.1–1.0)
Monocytes Relative: 20 %
Neutro Abs: 3.5 K/uL (ref 1.7–7.7)
Neutrophils Relative %: 62 %
Platelets: 276 K/uL (ref 150–400)
RBC: 3.86 MIL/uL — ABNORMAL LOW (ref 4.22–5.81)
RDW: 15.8 % — ABNORMAL HIGH (ref 11.5–15.5)
WBC: 5.6 K/uL (ref 4.0–10.5)
nRBC: 0 % (ref 0.0–0.2)

## 2020-07-30 LAB — BASIC METABOLIC PANEL WITH GFR
Anion gap: 7 (ref 5–15)
BUN: 38 mg/dL — ABNORMAL HIGH (ref 8–23)
CO2: 24 mmol/L (ref 22–32)
Calcium: 9.3 mg/dL (ref 8.9–10.3)
Chloride: 107 mmol/L (ref 98–111)
Creatinine, Ser: 2.39 mg/dL — ABNORMAL HIGH (ref 0.61–1.24)
GFR, Estimated: 25 mL/min — ABNORMAL LOW
Glucose, Bld: 100 mg/dL — ABNORMAL HIGH (ref 70–99)
Potassium: 4.5 mmol/L (ref 3.5–5.1)
Sodium: 138 mmol/L (ref 135–145)

## 2020-07-30 LAB — GLUCOSE, CAPILLARY
Glucose-Capillary: 103 mg/dL — ABNORMAL HIGH (ref 70–99)
Glucose-Capillary: 145 mg/dL — ABNORMAL HIGH (ref 70–99)
Glucose-Capillary: 124 mg/dL — ABNORMAL HIGH (ref 70–99)
Glucose-Capillary: 159 mg/dL — ABNORMAL HIGH (ref 70–99)

## 2020-07-30 LAB — MAGNESIUM: Magnesium: 2.2 mg/dL (ref 1.7–2.4)

## 2020-07-30 MED ORDER — COVID-19 MRNA VAC-TRIS(PFIZER) 30 MCG/0.3ML IM SUSP
INTRAMUSCULAR | 0 refills | Status: DC
  Filled 2020-07-30: qty 0.3, 1d supply, fill #0

## 2020-07-30 MED ORDER — TRAMADOL HCL 50 MG PO TABS
50.0000 mg | ORAL_TABLET | Freq: Four times a day (QID) | ORAL | Status: DC | PRN
  Administered 2020-07-30: 50 mg via ORAL
  Filled 2020-07-30: qty 1

## 2020-07-30 MED ORDER — DOXYCYCLINE HYCLATE 100 MG PO TABS
100.0000 mg | ORAL_TABLET | Freq: Two times a day (BID) | ORAL | Status: DC
  Administered 2020-07-30 – 2020-07-31 (×2): 100 mg via ORAL
  Filled 2020-07-30 (×2): qty 1

## 2020-07-30 NOTE — Evaluation (Signed)
Physical Therapy Evaluation Patient Details Name: Jordan Hoffman MRN: [id number] DOB: 01/10/1929 Today's Date: 07/30/2020   History of Present Illness  pt is a 87 y.o. male who presented to ED with worsening SOB found to have bacterial pneumonoia. PMH significant for atrial fibrillation, COPD, OSA, diabetes  Clinical Impression  Pt is a 87 y.o. male with above listed HPI. Pt required MIN assist progressing to MIN guard for safety with ambulation using his straight cane. Pt was limited by fatigue this session with lowest O2 sat at 87% on RA with ~3-56mn to recover >92% prior to ambulation while EOB, lowest O2 sat during ambulation 90% and pt able to recover much quicker ~162m to >93%. Pt O2 sat at 95% on RA at EOS. Pt lives with spouse and has family that lives nearby to assist if needed. Recommend home with family care. Pt will benefit from continued skilled PT in order to address listed impairments, improve activity tolerance to maximize functional mobility.     Follow Up Recommendations No PT follow up    Equipment Recommendations  None recommended by PT    Recommendations for Other Services       Precautions / Restrictions Precautions Precautions: None Restrictions Weight Bearing Restrictions: No      Mobility  Bed Mobility Overal bed mobility: Needs Assistance Bed Mobility: Supine to Sit     Supine to sit: Supervision;HOB elevated     General bed mobility comments: supervision for safety. O2 level noted to be low of 87% with 3-6m39mto recover to 94% before transfers and ambulation.    Transfers Overall transfer level: Needs assistance Equipment used: None Transfers: Sit to/from Stand Sit to Stand: Supervision         General transfer comment: x2 from EOB; supervision to maintain pt safety  Ambulation/Gait Ambulation/Gait assistance: Min guard;Min assist Gait Distance (Feet): 55 Feet Assistive device: Straight cane Gait Pattern/deviations: Step-through  pattern;Decreased stride length Gait velocity: decr   General Gait Details: MIN guard for safety with ambulation. Pt reported that his legs feel little weaker/shaky compared to normal, no LOB observed. Lowest O2 sat during ambulation 90% with ~1mi36mo recover to 93% on RA. O2 sat at 95% at EOS while sitting up in recliner.  Stairs            Wheelchair Mobility    Modified Rankin (Stroke Patients Only)       Balance Overall balance assessment: Needs assistance Sitting-balance support: Feet supported Sitting balance-Leahy Scale: Good     Standing balance support: Single extremity supported Standing balance-Leahy Scale: Fair Standing balance comment: Pt able to pull up brief in standing with single UE supported on bedrail and superision for safety with no LOB                             Pertinent Vitals/Pain Pain Assessment: 0-10 Pain Score: 6  Pain Location: lower back Pain Descriptors / Indicators: Sore;Discomfort Pain Intervention(s): Limited activity within patient's tolerance;Monitored during session;Repositioned    Home Living Family/patient expects to be discharged to:: Private residence Living Arrangements: Spouse/significant other Available Help at Discharge: Family Type of Home: House Home Access: Level entry     Home Layout: One level Home Equipment: Cane - single point;Shower seat;Grab bars - tub/shower;Grab bars - toilet;Walker - 4 wheels Additional Comments: Pt reports independence with all ADLS. Pt has 2 small steps between kitchen and his den area that he sits in. Pt lives  with wife, has other family to assist if needed. Denies hx of falls.    Prior Function Level of Independence: Independent with assistive device(s)         Comments: use of rollator and cane     Hand Dominance   Dominant Hand: Left    Extremity/Trunk Assessment   Upper Extremity Assessment Upper Extremity Assessment: Overall WFL for tasks assessed    Lower  Extremity Assessment Lower Extremity Assessment: Generalized weakness    Cervical / Trunk Assessment Cervical / Trunk Assessment: Kyphotic  Communication   Communication: No difficulties  Cognition Arousal/Alertness: Awake/alert Behavior During Therapy: WFL for tasks assessed/performed Overall Cognitive Status: Within Functional Limits for tasks assessed                                        General Comments      Exercises     Assessment/Plan    PT Assessment Patient needs continued PT services  PT Problem List Decreased strength;Decreased activity tolerance;Decreased balance;Decreased mobility       PT Treatment Interventions Gait training;Stair training;Functional mobility training;Therapeutic activities;Therapeutic exercise;Patient/family education    PT Goals (Current goals can be found in the Care Plan section)  Acute Rehab PT Goals Patient Stated Goal: get better and be able to go home PT Goal Formulation: With patient Time For Goal Achievement: 08/13/20 Potential to Achieve Goals: Good    Frequency Min 5X/week   Barriers to discharge        Co-evaluation               AM-PAC PT "6 Clicks" Mobility  Outcome Measure Help needed turning from your back to your side while in a flat bed without using bedrails?: None Help needed moving from lying on your back to sitting on the side of a flat bed without using bedrails?: A Little Help needed moving to and from a bed to a chair (including a wheelchair)?: A Little Help needed standing up from a chair using your arms (e.g., wheelchair or bedside chair)?: A Little Help needed to walk in hospital room?: A Little Help needed climbing 3-5 steps with a railing? : A Lot 6 Click Score: 18    End of Session Equipment Utilized During Treatment: Gait belt Activity Tolerance: Patient tolerated treatment well;Patient limited by fatigue Patient left: in chair;with call bell/phone within reach;with chair  alarm set Nurse Communication: Mobility status (O2 sat during mobility) PT Visit Diagnosis: Unsteadiness on feet (R26.81);Muscle weakness (generalized) (M62.81)    Time: [id number] PT Time Calculation (min) (ACUTE ONLY): 34 min   Charges:   PT Evaluation $PT Eval Low Complexity: 1 Low PT Treatments $Therapeutic Activity: 23-37 mins        Festus Barren PT, DPT  Acute Rehabilitation Services  Office [phone]   07/30/2020, 9:55 AM

## 2020-07-30 NOTE — Progress Notes (Signed)
PHARMACIST - PHYSICIAN COMMUNICATION DR:   Starla Link CONCERNING: Antibiotic IV to Oral Route Change Policy  RECOMMENDATION: This patient is receiving doxycycline by the intravenous route.  Based on criteria approved by the Pharmacy and Therapeutics Committee, the antibiotic(s) is/are being converted to the equivalent oral dose form(s).   DESCRIPTION: These criteria include: Patient being treated for a respiratory tract infection, urinary tract infection, cellulitis or clostridium difficile associated diarrhea if on metronidazole The patient is not neutropenic and does not exhibit a GI malabsorption state The patient is eating (either orally or via tube) and/or has been taking other orally administered medications for a least 24 hours The patient is improving clinically and has a Tmax < 100.5  If you have questions about this conversion, please contact the Pharmacy Department  '[]'$   ( [id number] )  [facility] '[]'$   [phone] )  [facility]  '[]'$   [phone] )  [facility] '[x]'$   [phone] )  [place], PharmD 07/30/2020 11:24 AM

## 2020-07-30 NOTE — Progress Notes (Signed)
Patient ID: Jordan Hoffman, male   DOB: 1928/02/07, 87 y.o.   MRN: [id number]  PROGRESS NOTE    ISAHIA BROICH  [id number] DOB: 01/13/1929 DOA: 07/28/2020 PCP: [facility] [place]   Brief Narrative:  87 year old male with history of atrial fibrillation, COPD, OSA, diabetes mellitus type 2 presented with worsening shortness of breath.  On presentation, he was found to have pneumonia on chest x-ray.  COVID testing was negative.  He was started on IV antibiotics.  Assessment & Plan:   Right upper lobar bacterial pneumonia -Chest x-ray showed right upper lobe pneumonia.  Currently on room air. -Continue Rocephin and doxycycline.  Possible switch to oral antibiotics in 1 to 2 days depending on clinical improvement.  Does not feel ready enough to be discharged home today yet. -Influenza and COVID-19 test were negative on presentation.  Follow cultures.  Acute kidney injury on chronic any disease stage IV -Baseline creatinine around 2.1-2.4.  Presented with creatinine of 2.8.  Treated with IV fluids.  Improving; creatinine 2.39 today.  Off IV fluids.  Diabetes mellitus type II -A1c 6.3.  Continue CBGs with SSI.  Hypertension -Monitor blood pressure.  Continue amlodipine.  Lasix on hold.  Possibly resume Lasix tomorrow.  COPD From stable.  Continue current inhaled regimen  Paroxysmal A. fib -Continue amiodarone and Eliquis  OSA -Continue CPAP at night  Generalized deconditioning -PT eval  DVT prophylaxis: Eliquis Code Status: Full Family Communication: None at bedside Disposition Plan: Status is: Inpatient  Remains inpatient appropriate because:Inpatient level of care appropriate due to severity of illness  Dispo: The patient is from: Home              Anticipated d/c is to: Home              Patient currently is not medically stable to d/c.   Difficult to place patient No  Consultants: None  Procedures: None  Antimicrobials: Rocephin and doxycycline from 07/28/2020  onwards   Subjective: Patient seen and examined at bedside.  No overnight fever, chest pain or vomiting reported.  Still feels short of breath with minimal exertion.  Does not feel ready to go home today although he feels slightly better since admission. Objective: Vitals:   07/30/20 0442 07/30/20 0445 07/30/20 0735 07/30/20 1021  BP:  125/80  116/66  Pulse:  74  81  Resp:  20    Temp:  99 F (37.2 C)    TempSrc:  Oral    SpO2:  96% 93% 95%  Weight: 93.6 kg     Height:        Intake/Output Summary (Last 24 hours) at 07/30/2020 1034 Last data filed at 07/30/2020 0900 Gross per 24 hour  Intake 580 ml  Output 850 ml  Net -270 ml    Filed Weights   07/28/20 2005 07/29/20 0453 07/30/20 0442  Weight: 95.3 kg 96.4 kg 93.6 kg    Examination:  General exam: Currently on room air.  No distress.  Elderly male lying in bed.  Respiratory system: Decreased breath sounds at bases bilaterally with some crackles  cardiovascular system: Rate controlled, S1-S2 heard  gastrointestinal system: Abdomen is mildly distended, soft and nontender.  Bowel sounds are heard Extremities: Trace bilateral lower extremity edema present; no clubbing   Data Reviewed: I have personally reviewed following labs and imaging studies  CBC: Recent Labs  Lab 07/28/20 1207 07/29/20 0454 07/30/20 0439  WBC 7.9 6.9 5.6  NEUTROABS 6.1  --  3.5  HGB 11.7* 10.1* 10.4*  HCT 37.3* 32.1* 33.7*  MCV 86.1 85.8 87.3  PLT 251 256 [id number]    Basic Metabolic Panel: Recent Labs  Lab 07/28/20 1207 07/28/20 1407 07/29/20 0454 07/30/20 0439  NA 140  --  138 138  K 4.8  --  4.5 4.5  CL 104  --  106 107  CO2 26  --  24 24  GLUCOSE 104*  --  85 100*  BUN 42*  --  37* 38*  CREATININE 2.80* 2.60* 2.54* 2.39*  CALCIUM 10.0  --  9.2 9.3  MG  --   --   --  2.2    GFR: Estimated Creatinine Clearance: 25.2 mL/min (A) (by C-G formula based on SCr of 2.39 mg/dL (H)). Liver Function Tests: Recent Labs  Lab 07/28/20 1207   AST 21  ALT 19  ALKPHOS 86  BILITOT 1.5*  PROT 7.6  ALBUMIN 3.6    Recent Labs  Lab 07/28/20 1207  LIPASE 21    No results for input(s): AMMONIA in the last 168 hours. Coagulation Profile: No results for input(s): INR, PROTIME in the last 168 hours. Cardiac Enzymes: No results for input(s): CKTOTAL, CKMB, CKMBINDEX, TROPONINI in the last 168 hours. BNP (last 3 results) No results for input(s): PROBNP in the last 8760 hours. HbA1C: Recent Labs    07/29/20 0454  HGBA1C 6.3*    CBG: Recent Labs  Lab 07/29/20 0738 07/29/20 1212 07/29/20 1639 07/29/20 2149 07/30/20 0732  GLUCAP 85 105* 117* 128* 103*    Lipid Profile: No results for input(s): CHOL, HDL, LDLCALC, TRIG, CHOLHDL, LDLDIRECT in the last 72 hours. Thyroid Function Tests: No results for input(s): TSH, T4TOTAL, FREET4, T3FREE, THYROIDAB in the last 72 hours. Anemia Panel: No results for input(s): VITAMINB12, FOLATE, FERRITIN, TIBC, IRON, RETICCTPCT in the last 72 hours. Sepsis Labs: Recent Labs  Lab 07/28/20 1207 07/28/20 1407  LATICACIDVEN 1.5 1.1     Recent Results (from the past 240 hour(s))  Blood culture (routine x 2)     Status: None (Preliminary result)   Collection Time: 07/28/20 12:07 PM   Specimen: BLOOD  Result Value Ref Range Status   Specimen Description   Final    BLOOD LEFT ANTECUBITAL Performed at [facility] [address], [place], [place] [address]    Special Requests   Final    BOTTLES DRAWN AEROBIC AND ANAEROBIC Blood Culture adequate volume Performed at [facility] [address] [place], [place] [address]    Culture   Final    NO GROWTH 2 DAYS Performed at [facility] [address] [place], [place] [address]    Report Status PENDING  Incomplete  Resp Panel by RT-PCR (Flu A&B, Covid) Nasopharyngeal Swab     Status: None   Collection Time: 07/28/20 12:07 PM   Specimen: Nasopharyngeal Swab; Nasopharyngeal(NP) swabs  in vial transport medium  Result Value Ref Range Status   SARS Coronavirus 2 by RT PCR NEGATIVE NEGATIVE Final    Comment: (NOTE) SARS-CoV-2 target nucleic acids are NOT DETECTED.  The SARS-CoV-2 RNA is generally detectable in upper respiratory specimens during the acute phase of infection. The lowest concentration of SARS-CoV-2 viral copies this assay can detect is 138 copies/mL. A negative result does not preclude SARS-Cov-2 infection and should not be used as the sole basis for treatment or other patient management decisions. A negative result may occur with  improper specimen collection/handling, submission of specimen other than  nasopharyngeal swab, presence of viral mutation(s) within the areas targeted by this assay, and inadequate number of viral copies(<138 copies/mL). A negative result must be combined with clinical observations, patient history, and epidemiological information. The expected result is Negative.  Fact Sheet for Patients:  EntrepreneurPulse.com.au  Fact Sheet for Healthcare Providers:  IncredibleEmployment.be  This test is no t yet approved or cleared by the [place] FDA and  has been authorized for detection and/or diagnosis of SARS-CoV-2 by FDA under an Emergency Use Authorization (EUA). This EUA will remain  in effect (meaning this test can be used) for the duration of the COVID-19 declaration under Section 564(b)(1) of the Act, 21 U.S.C.section 360bbb-3(b)(1), unless the authorization is terminated  or revoked sooner.       Influenza A by PCR NEGATIVE NEGATIVE Final   Influenza B by PCR NEGATIVE NEGATIVE Final    Comment: (NOTE) The Xpert Xpress SARS-CoV-2/FLU/RSV plus assay is intended as an aid in the diagnosis of influenza from Nasopharyngeal swab specimens and should not be used as a sole basis for treatment. Nasal washings and aspirates are unacceptable for Xpert Xpress  SARS-CoV-2/FLU/RSV testing.  Fact Sheet for Patients: EntrepreneurPulse.com.au  Fact Sheet for Healthcare Providers: IncredibleEmployment.be  This test is not yet approved or cleared by the [place] FDA and has been authorized for detection and/or diagnosis of SARS-CoV-2 by FDA under an Emergency Use Authorization (EUA). This EUA will remain in effect (meaning this test can be used) for the duration of the COVID-19 declaration under Section 564(b)(1) of the Act, 21 U.S.C. section 360bbb-3(b)(1), unless the authorization is terminated or revoked.  Performed at [facility], [place] [address] [place], [place] [address]   Blood culture (routine x 2)     Status: None (Preliminary result)   Collection Time: 07/28/20 12:12 PM   Specimen: BLOOD  Result Value Ref Range Status   Specimen Description   Final    BLOOD BLOOD LEFT HAND Performed at [facility] [address] [place], [place] [address]    Special Requests   Final    BOTTLES DRAWN AEROBIC AND ANAEROBIC Blood Culture adequate volume Performed at [facility] [address], [place], [place] [address]    Culture   Final    NO GROWTH 2 DAYS Performed at [facility] [address], [place], [place] [address]    Report Status PENDING  Incomplete          Radiology Studies: DG Chest 2 View  Result Date: 07/28/2020 CLINICAL DATA:  Show of breath, fever EXAM: CHEST - 2 VIEW COMPARISON:  04/08/2020 FINDINGS: Patchy opacity in the right upper lobe compatible with pneumonia. Mild elevation of the left hemidiaphragm is stable. Cardiomegaly. No effusions or acute bony abnormality. IMPRESSION: Right upper lobe airspace opacity concerning for pneumonia. Cardiomegaly. Electronically Signed   By: Rolm Baptise M.D.   On: 07/28/2020 11:35        Scheduled Meds:  acetaminophen  650 mg Oral BID   amiodarone  200 mg Oral Daily    amLODipine  10 mg Oral Daily   apixaban  2.5 mg Oral BID   calcium-vitamin D  1 tablet Oral BID   cholecalciferol  1,000 Units Oral Daily   fluticasone furoate-vilanterol  1 puff Inhalation Daily   gabapentin  300 mg Oral BID   guaiFENesin  600 mg Oral BID   insulin aspart  0-15 Units Subcutaneous TID WC   latanoprost  1  drop Both Eyes QHS   loratadine  10 mg Oral Daily   pantoprazole  40 mg Oral Daily   polyvinyl alcohol  1 drop Both Eyes QID   psyllium  1 packet Oral Daily   umeclidinium bromide  1 puff Inhalation Daily   Continuous Infusions:  cefTRIAXone (ROCEPHIN)  IV 2 g (07/29/20 1832)   doxycycline (VIBRAMYCIN) IV 100 mg (07/29/20 2104)          [name], MD [facility] 07/30/2020, 10:34 AM

## 2020-07-31 DIAGNOSIS — G4733 Obstructive sleep apnea (adult) (pediatric): Secondary | ICD-10-CM

## 2020-07-31 LAB — CBC WITH DIFFERENTIAL/PLATELET
Abs Immature Granulocytes: 0.02 10*3/uL (ref 0.00–0.07)
Basophils Absolute: 0 10*3/uL (ref 0.0–0.1)
Basophils Relative: 1 %
Eosinophils Absolute: 0.1 10*3/uL (ref 0.0–0.5)
Eosinophils Relative: 2 %
HCT: 34 % — ABNORMAL LOW (ref 39.0–52.0)
Hemoglobin: 10.3 g/dL — ABNORMAL LOW (ref 13.0–17.0)
Immature Granulocytes: 1 %
Lymphocytes Relative: 20 %
Lymphs Abs: 0.9 10*3/uL (ref 0.7–4.0)
MCH: 26.1 pg (ref 26.0–34.0)
MCHC: 30.3 g/dL (ref 30.0–36.0)
MCV: 86.3 fL (ref 80.0–100.0)
Monocytes Absolute: 0.8 10*3/uL (ref 0.1–1.0)
Monocytes Relative: 18 %
Neutro Abs: 2.6 10*3/uL (ref 1.7–7.7)
Neutrophils Relative %: 58 %
Platelets: 300 10*3/uL (ref 150–400)
RBC: 3.94 MIL/uL — ABNORMAL LOW (ref 4.22–5.81)
RDW: 15.5 % (ref 11.5–15.5)
WBC: 4.3 10*3/uL (ref 4.0–10.5)
nRBC: 0 % (ref 0.0–0.2)

## 2020-07-31 LAB — BASIC METABOLIC PANEL
Anion gap: 9 (ref 5–15)
BUN: 32 mg/dL — ABNORMAL HIGH (ref 8–23)
CO2: 24 mmol/L (ref 22–32)
Calcium: 9.2 mg/dL (ref 8.9–10.3)
Chloride: 107 mmol/L (ref 98–111)
Creatinine, Ser: 1.83 mg/dL — ABNORMAL HIGH (ref 0.61–1.24)
GFR, Estimated: 35 mL/min — ABNORMAL LOW (ref >60)
Glucose, Bld: 100 mg/dL — ABNORMAL HIGH (ref 70–99)
Potassium: 4.3 mmol/L (ref 3.5–5.1)
Sodium: 140 mmol/L (ref 135–145)

## 2020-07-31 LAB — MAGNESIUM: Magnesium: 2.1 mg/dL (ref 1.7–2.4)

## 2020-07-31 LAB — GLUCOSE, CAPILLARY: Glucose-Capillary: 102 mg/dL — ABNORMAL HIGH (ref 70–99)

## 2020-07-31 MED ORDER — APIXABAN 2.5 MG PO TABS: 2.5000 mg | ORAL_TABLET | Freq: Two times a day (BID) | ORAL | 0 refills | Status: DC

## 2020-07-31 MED ORDER — GUAIFENESIN-DM 100-10 MG/5ML PO SYRP: 10.0000 mL | ORAL_SOLUTION | ORAL | 1 refills | Status: DC | PRN

## 2020-07-31 MED ORDER — CEFUROXIME AXETIL 500 MG PO TABS: 500.0000 mg | ORAL_TABLET | Freq: Two times a day (BID) | ORAL | 0 refills | Status: AC

## 2020-07-31 NOTE — Discharge Summary (Addendum)
Physician Discharge Summary  Jordan Hoffman [id number] DOB: 1928/12/13 DOA: 07/28/2020  PCP: [facility]  Admit date: 07/28/2020 Discharge date: 07/31/2020  Admitted From: Home Disposition: Home  Recommendations for Outpatient Follow-up:  Follow up with PCP in 1 week with repeat CBC/BMP Follow up in ED if symptoms worsen or new appear   Home Health: No Equipment/Devices: None  Discharge Condition: Stable CODE STATUS: Full Diet recommendation: Heart healthy/carb modified  Brief/Interim Summary: 87 year old male with history of atrial fibrillation, COPD, OSA, diabetes mellitus type 2 presented with worsening shortness of breath.  On presentation, he was found to have pneumonia on chest x-ray.  COVID testing was negative.  He was started on IV antibiotics.  During the hospitalization, his condition has improved.  He is currently on room air with stable respiratory status.  Hemodynamically stable.  He will be discharged home today on oral antibiotics.  Discharge Diagnoses:   Right upper lobar bacterial pneumonia -Chest x-ray showed right upper lobe pneumonia.  Currently on room air. -Treated with Rocephin and doxycycline.   -Influenza and COVID-19 test were negative on presentation.  Cultures negative so far. -Respiratory status is improved and remained stable.  He feels okay to go home today.  Discharged home on Ceftin for 5 more days.  Outpatient follow-up with PCP.   Acute kidney injury on chronic any disease stage IV -Baseline creatinine around 2.1-2.4.  Presented with creatinine of 2.8.  Treated with IV fluids.  Improving; creatinine 1.83 today.  Off IV fluids.  Resume Lasix on discharge.  Outpatient follow-up of BMP with PCP   Diabetes mellitus type II -A1c 6.3.  Carb modified diet.  Blood sugars intermittently on the lower side.  Hold glipizide till reevaluated by PCP.  Hypertension -Continue amlodipine.  Lasix on hold currently.  Resume Lasix on  discharge.  COPD -Continue current inhaled regimen  Paroxysmal A. fib -Continue amiodarone and Eliquis   OSA -Continue CPAP at night   Generalized deconditioning -Patient has tolerated PT eval and no need for home health PT.  Discharge Instructions  Discharge Instructions     Diet - low sodium heart healthy   Complete by: As directed    Diet Carb Modified   Complete by: As directed    Increase activity slowly   Complete by: As directed       Allergies as of 07/31/2020       Reactions   Lisinopril Cough      Penicillins Other (See Comments)   Did it involve swelling of the face/tongue/throat, SOB, or low BP? Yes Did it involve sudden or severe rash/hives, skin peeling, or any reaction on the inside of your mouth or nose? No Did you need to seek medical attention at a hospital or doctor's office? No When did it last happen? Unk  If all above answers are "NO", may proceed with cephalosporin use.   *pt tolerated ancef on 08/08/16   Pravastatin Hives      Sulfa Antibiotics Itching, Other (See Comments)   Itching, watery eyes   Atorvastatin Other (See Comments)   Muscle pain   Codeine Hives      Flunisolide Hives   epistaxis Other reaction(s): Bleeding from nose, Other (See Comments) Hives  Hives    Loratadine    Hives  Other reaction(s): Other (See Comments), Other (See Comments) Hives  Hives  Hives    Niacin And Related    Hives    Niaspan [niacin] Hives, Other (See Comments)   Flushing,  also   Simvastatin Hives   weakness Other reaction(s): Weakness present   Statins Hives   weakness Other reaction(s): Other (See Comments), Unknown Hives  weakness Hives  Hives  Hives    Ciprofloxacin Rash   Hives  Other reaction(s): Other (See Comments), Other (See Comments) Hives  Hives  Hives    Erythromycin Rash   Hives  Other reaction(s): Other (See Comments), Other (See Comments) Hives  Hives  Hives    Ranitidine Rash   Hives  Other reaction(s):  Other (See Comments), Other (See Comments) Hives  Hives  Hives         Medication List     STOP taking these medications    glipiZIDE 5 MG tablet Commonly known as: GLUCOTROL       TAKE these medications    acetaminophen 325 MG tablet Commonly known as: TYLENOL Take 975 mg by mouth 2 (two) times daily.   albuterol (2.5 MG/3ML) 0.083% nebulizer solution Commonly known as: PROVENTIL Take 2.5 mg by nebulization every 6 (six) hours as needed for wheezing or shortness of breath.   albuterol 108 (90 Base) MCG/ACT inhaler Commonly known as: VENTOLIN HFA Inhale 2 puffs into the lungs every 6 (six) hours as needed for wheezing or shortness of breath.   amiodarone 200 MG tablet Commonly known as: PACERONE Take 200 mg by mouth daily.   amLODipine 10 MG tablet Commonly known as: NORVASC Take 10 mg by mouth daily.   apixaban 2.5 MG Tabs tablet Commonly known as: ELIQUIS Take 1 tablet (2.5 mg total) by mouth 2 (two) times daily. What changed:  medication strength how much to take when to take this   Calcium Carbonate-Vitamin D3 600-400 MG-UNIT Tabs Take 1 tablet by mouth 2 (two) times daily.   CALCIUM PO Take 1 tablet by mouth daily.   carboxymethylcellulose 0.5 % Soln Commonly known as: REFRESH PLUS Place 1 drop into both eyes 4 (four) times daily.   cefUROXime 500 MG tablet Commonly known as: CEFTIN Take 1 tablet (500 mg total) by mouth 2 (two) times daily for 5 days.   cholecalciferol 25 MCG (1000 UNIT) tablet Commonly known as: VITAMIN D Take 1,000 Units by mouth daily.   diphenhydrAMINE 25 MG tablet Commonly known as: BENADRYL Take 25 mg by mouth every 6 (six) hours as needed for sleep.   fluticasone 50 MCG/ACT nasal spray Commonly known as: FLONASE Place 2 sprays into both nostrils daily as needed for allergies or rhinitis.   Fluticasone-Salmeterol 250-50 MCG/DOSE Aepb Commonly known as: ADVAIR Inhale 1 puff into the lungs 2 (two) times daily.    furosemide 40 MG tablet Commonly known as: LASIX Take 20 mg by mouth daily at 6 (six) AM.   gabapentin 300 MG capsule Commonly known as: NEURONTIN Take 300 mg by mouth 2 (two) times daily.   guaiFENesin 600 MG 12 hr tablet Commonly known as: MUCINEX Take 600 mg by mouth 2 (two) times daily.   guaiFENesin-dextromethorphan 100-10 MG/5ML syrup Commonly known as: ROBITUSSIN DM Take 10 mLs by mouth every 4 (four) hours as needed for cough.   HYDROCERIN EX Apply 1 application topically daily as needed (dry skin).   latanoprost 0.005 % ophthalmic solution Commonly known as: XALATAN Place 1 drop into both eyes at bedtime.   loratadine 10 MG tablet Commonly known as: CLARITIN Take 10 mg by mouth daily.   omeprazole 20 MG capsule Commonly known as: PRILOSEC Take 20 mg by mouth daily.   psyllium 58.6 % powder  Commonly known as: METAMUCIL Take 1 packet by mouth daily.   sodium chloride 0.65 % Soln nasal spray Commonly known as: OCEAN Place 2 sprays into both nostrils 2 (two) times daily as needed for congestion.   terbinafine 1 % cream Commonly known as: LAMISIL Apply 1 application topically 2 (two) times daily as needed (between toes).   THERA-GESIC EX Apply 1 application topically every 6 (six) hours as needed (pain AVOID FACE AND EYES - [place] HANDS AFTER USING).   Tiotropium Bromide Monohydrate 2.5 MCG/ACT Aers Inhale 2 puffs into the lungs daily.   urea 20 % cream Commonly known as: CARMOL Apply 1 application topically 2 (two) times daily as needed (dry skin on feet DO NOT USE BETWEEN TOES).          Follow-up [facility]. Schedule an appointment as soon as possible for a visit in 1 week(s).   Specialty: General Practice Why: with repeat cbc/bmp Contact information: [place] [address] [phone]                Allergies  Allergen Reactions   Lisinopril Cough        Penicillins Other (See Comments)     Did it involve swelling of the face/tongue/throat, SOB, or low BP? Yes Did it involve sudden or severe rash/hives, skin peeling, or any reaction on the inside of your mouth or nose? No Did you need to seek medical attention at a hospital or doctor's office? No When did it last happen? Unk  If all above answers are "NO", may proceed with cephalosporin use.   *pt tolerated ancef on 08/08/16    Pravastatin Hives        Sulfa Antibiotics Itching and Other (See Comments)    Itching, watery eyes   Atorvastatin Other (See Comments)    Muscle pain    Codeine Hives        Flunisolide Hives    epistaxis Other reaction(s): Bleeding from nose, Other (See Comments) Hives  Hives     Loratadine     Hives  Other reaction(s): Other (See Comments), Other (See Comments) Hives  Hives  Hives     Niacin And Related     Hives    Niaspan [Niacin] Hives and Other (See Comments)    Flushing, also   Simvastatin Hives    weakness Other reaction(s): Weakness present   Statins Hives    weakness Other reaction(s): Other (See Comments), Unknown Hives  weakness Hives  Hives  Hives     Ciprofloxacin Rash    Hives  Other reaction(s): Other (See Comments), Other (See Comments) Hives  Hives  Hives     Erythromycin Rash    Hives  Other reaction(s): Other (See Comments), Other (See Comments) Hives  Hives  Hives     Ranitidine Rash    Hives  Other reaction(s): Other (See Comments), Other (See Comments) Hives  Hives  Hives      Consultations: None   Procedures/Studies: DG Chest 2 View  Result Date: 07/28/2020 CLINICAL DATA:  Show of breath, fever EXAM: CHEST - 2 VIEW COMPARISON:  04/08/2020 FINDINGS: Patchy opacity in the right upper lobe compatible with pneumonia. Mild elevation of the left hemidiaphragm is stable. Cardiomegaly. No effusions or acute bony abnormality. IMPRESSION: Right upper lobe airspace opacity concerning for pneumonia. Cardiomegaly. Electronically Signed    By: Rolm Baptise M.D.   On: 07/28/2020 11:35      Subjective: Patient  seen and examined at bedside.  No overnight fever or vomiting reported.  Still having intermittent cough but feels better and feels okay to go home today.  Discharge Exam: Vitals:   07/31/20 0756 07/31/20 0758  BP:    Pulse:    Resp:    Temp:    SpO2: 91% 91%    General: Pt is alert, awake, not in acute distress.  Currently on room air. Cardiovascular: rate controlled, S1/S2 + Respiratory: bilateral decreased breath sounds at bases with some scattered crackles Abdominal: Soft, NT, ND, bowel sounds + Extremities: Trace lower extremity edema; no cyanosis    The results of significant diagnostics from this hospitalization (including imaging, microbiology, ancillary and laboratory) are listed below for reference.     Microbiology: Recent Results (from the past 240 hour(s))  Blood culture (routine x 2)     Status: None (Preliminary result)   Collection Time: 07/28/20 12:07 PM   Specimen: BLOOD  Result Value Ref Range Status   Specimen Description   Final    BLOOD LEFT ANTECUBITAL Performed at [facility] [address], [place], [place] [address]    Special Requests   Final    BOTTLES DRAWN AEROBIC AND ANAEROBIC Blood Culture adequate volume Performed at [facility] [address], [place], [place] [address]    Culture   Final    NO GROWTH 2 DAYS Performed at [facility] [address] [place], [place] [address]    Report Status PENDING  Incomplete  Resp Panel by RT-PCR (Flu A&B, Covid) Nasopharyngeal Swab     Status: None   Collection Time: 07/28/20 12:07 PM   Specimen: Nasopharyngeal Swab; Nasopharyngeal(NP) swabs in vial transport medium  Result Value Ref Range Status   SARS Coronavirus 2 by RT PCR NEGATIVE NEGATIVE Final    Comment: (NOTE) SARS-CoV-2 target nucleic acids are NOT DETECTED.  The SARS-CoV-2 RNA is generally detectable in upper  respiratory specimens during the acute phase of infection. The lowest concentration of SARS-CoV-2 viral copies this assay can detect is 138 copies/mL. A negative result does not preclude SARS-Cov-2 infection and should not be used as the sole basis for treatment or other patient management decisions. A negative result may occur with  improper specimen collection/handling, submission of specimen other than nasopharyngeal swab, presence of viral mutation(s) within the areas targeted by this assay, and inadequate number of viral copies(<138 copies/mL). A negative result must be combined with clinical observations, patient history, and epidemiological information. The expected result is Negative.  Fact Sheet for Patients:  EntrepreneurPulse.com.au  Fact Sheet for Healthcare Providers:  IncredibleEmployment.be  This test is no t yet approved or cleared by the [place] FDA and  has been authorized for detection and/or diagnosis of SARS-CoV-2 by FDA under an Emergency Use Authorization (EUA). This EUA will remain  in effect (meaning this test can be used) for the duration of the COVID-19 declaration under Section 564(b)(1) of the Act, 21 U.S.C.section 360bbb-3(b)(1), unless the authorization is terminated  or revoked sooner.       Influenza A by PCR NEGATIVE NEGATIVE Final   Influenza B by PCR NEGATIVE NEGATIVE Final    Comment: (NOTE) The Xpert Xpress SARS-CoV-2/FLU/RSV plus assay is intended as an aid in the diagnosis of influenza from Nasopharyngeal swab specimens and should not be used as a sole basis for treatment. Nasal washings and aspirates are unacceptable for Xpert Xpress SARS-CoV-2/FLU/RSV testing.  Fact Sheet for Patients: EntrepreneurPulse.com.au  Fact  Sheet for Healthcare Providers: IncredibleEmployment.be  This test is not yet approved or cleared by the [place] and has been  authorized for detection and/or diagnosis of SARS-CoV-2 by FDA under an Emergency Use Authorization (EUA). This EUA will remain in effect (meaning this test can be used) for the duration of the COVID-19 declaration under Section 564(b)(1) of the Act, 21 U.S.C. section 360bbb-3(b)(1), unless the authorization is terminated or revoked.  Performed at [facility], [place] [address] [place], [place] [address]   Blood culture (routine x 2)     Status: None (Preliminary result)   Collection Time: 07/28/20 12:12 PM   Specimen: BLOOD  Result Value Ref Range Status   Specimen Description   Final    BLOOD BLOOD LEFT HAND Performed at [facility] [address] [place], [place] [address]    Special Requests   Final    BOTTLES DRAWN AEROBIC AND ANAEROBIC Blood Culture adequate volume Performed at [facility] [address], [place], [place] [address]    Culture   Final    NO GROWTH 2 DAYS Performed at [facility] [address], [place], [place] [address]    Report Status PENDING  Incomplete     Labs: BNP (last 3 results) Recent Labs    01/02/20 1308 04/08/20 1626 07/28/20 1207  BNP 272.2* 190.5* [id number]*   Basic Metabolic Panel: Recent Labs  Lab 07/28/20 1207 07/28/20 1407 07/29/20 0454 07/30/20 0439 07/31/20 0518  NA 140  --  138 138 140  K 4.8  --  4.5 4.5 4.3  CL 104  --  106 107 107  CO2 26  --  '24 24 24  '$ GLUCOSE 104*  --  85 100* 100*  BUN 42*  --  37* 38* 32*  CREATININE 2.80* 2.60* 2.54* 2.39* 1.83*  CALCIUM 10.0  --  9.2 9.3 9.2  MG  --   --   --  2.2 2.1   Liver Function Tests: Recent Labs  Lab 07/28/20 1207  AST 21  ALT 19  ALKPHOS 86  BILITOT 1.5*  PROT 7.6  ALBUMIN 3.6   Recent Labs  Lab 07/28/20 1207  LIPASE 21   No results for input(s): AMMONIA in the last 168 hours. CBC: Recent Labs  Lab 07/28/20 1207 07/29/20 0454 07/30/20 0439 07/31/20 0518  WBC 7.9 6.9 5.6 4.3   NEUTROABS 6.1  --  3.5 2.6  HGB 11.7* 10.1* 10.4* 10.3*  HCT 37.3* 32.1* 33.7* 34.0*  MCV 86.1 85.8 87.3 86.3  PLT 251 256 276 300   Cardiac Enzymes: No results for input(s): CKTOTAL, CKMB, CKMBINDEX, TROPONINI in the last 168 hours. BNP: Invalid input(s): POCBNP CBG: Recent Labs  Lab 07/30/20 0732 07/30/20 1128 07/30/20 1548 07/30/20 2050 07/31/20 0734  GLUCAP 103* 145* 124* 159* 102*   D-Dimer No results for input(s): DDIMER in the last 72 hours. Hgb A1c Recent Labs    07/29/20 0454  HGBA1C 6.3*   Lipid Profile No results for input(s): CHOL, HDL, LDLCALC, TRIG, CHOLHDL, LDLDIRECT in the last 72 hours. Thyroid function studies No results for input(s): TSH, T4TOTAL, T3FREE, THYROIDAB in the last 72 hours.  Invalid input(s): FREET3 Anemia work up No results for input(s): VITAMINB12, FOLATE, FERRITIN, TIBC, IRON, RETICCTPCT in the last 72 hours. Urinalysis    Component Value Date/Time   COLORURINE YELLOW 04/08/2020 1745   APPEARANCEUR CLEAR 04/08/2020 1745   LABSPEC 1.020 04/08/2020 1745   PHURINE 5.5  04/08/2020 [place] 04/08/2020 1745   HGBUR NEGATIVE 04/08/2020 1745   BILIRUBINUR NEGATIVE 04/08/2020 1745   KETONESUR NEGATIVE 04/08/2020 1745   PROTEINUR NEGATIVE 04/08/2020 1745   NITRITE NEGATIVE 04/08/2020 1745   LEUKOCYTESUR TRACE (A) 04/08/2020 1745   Sepsis Labs Invalid input(s): PROCALCITONIN,  WBC,  LACTICIDVEN Microbiology Recent Results (from the past 240 hour(s))  Blood culture (routine x 2)     Status: None (Preliminary result)   Collection Time: 07/28/20 12:07 PM   Specimen: BLOOD  Result Value Ref Range Status   Specimen Description   Final    BLOOD LEFT ANTECUBITAL Performed at [facility], [place] [address] [place], [place] [address]    Special Requests   Final    BOTTLES DRAWN AEROBIC AND ANAEROBIC Blood Culture adequate volume Performed at [facility] [address]  [place], [place] [address]    Culture   Final    NO GROWTH 2 DAYS Performed at [facility] [address] [place], [place] [address]    Report Status PENDING  Incomplete  Resp Panel by RT-PCR (Flu A&B, Covid) Nasopharyngeal Swab     Status: None   Collection Time: 07/28/20 12:07 PM   Specimen: Nasopharyngeal Swab; Nasopharyngeal(NP) swabs in vial transport medium  Result Value Ref Range Status   SARS Coronavirus 2 by RT PCR NEGATIVE NEGATIVE Final    Comment: (NOTE) SARS-CoV-2 target nucleic acids are NOT DETECTED.  The SARS-CoV-2 RNA is generally detectable in upper respiratory specimens during the acute phase of infection. The lowest concentration of SARS-CoV-2 viral copies this assay can detect is 138 copies/mL. A negative result does not preclude SARS-Cov-2 infection and should not be used as the sole basis for treatment or other patient management decisions. A negative result may occur with  improper specimen collection/handling, submission of specimen other than nasopharyngeal swab, presence of viral mutation(s) within the areas targeted by this assay, and inadequate number of viral copies(<138 copies/mL). A negative result must be combined with clinical observations, patient history, and epidemiological information. The expected result is Negative.  Fact Sheet for Patients:  EntrepreneurPulse.com.au  Fact Sheet for Healthcare Providers:  IncredibleEmployment.be  This test is no t yet approved or cleared by the [place] FDA and  has been authorized for detection and/or diagnosis of SARS-CoV-2 by FDA under an Emergency Use Authorization (EUA). This EUA will remain  in effect (meaning this test can be used) for the duration of the COVID-19 declaration under Section 564(b)(1) of the Act, 21 U.S.C.section 360bbb-3(b)(1), unless the authorization is terminated  or revoked sooner.       Influenza A by PCR NEGATIVE NEGATIVE Final    Influenza B by PCR NEGATIVE NEGATIVE Final    Comment: (NOTE) The Xpert Xpress SARS-CoV-2/FLU/RSV plus assay is intended as an aid in the diagnosis of influenza from Nasopharyngeal swab specimens and should not be used as a sole basis for treatment. Nasal washings and aspirates are unacceptable for Xpert Xpress SARS-CoV-2/FLU/RSV testing.  Fact Sheet for Patients: EntrepreneurPulse.com.au  Fact Sheet for Healthcare Providers: IncredibleEmployment.be  This test is not yet approved or cleared by the [place] FDA and has been authorized for detection and/or diagnosis of SARS-CoV-2 by FDA under an Emergency Use Authorization (EUA). This EUA will remain in effect (meaning this test can be used) for the duration of the COVID-19 declaration under Section 564(b)(1) of the Act, 21 U.S.C. section 360bbb-3(b)(1), unless the authorization is terminated or revoked.  Performed at [facility], [place] [address], [place], [place] [address]   Blood culture (routine x 2)     Status: None (Preliminary result)   Collection Time: 07/28/20 12:12 PM   Specimen: BLOOD  Result Value Ref Range Status   Specimen Description   Final    BLOOD BLOOD LEFT HAND Performed at [facility] [address] [place], [place] [address]    Special Requests   Final    BOTTLES DRAWN AEROBIC AND ANAEROBIC Blood Culture adequate volume Performed at [facility] [address], [place], [place] [address]    Culture   Final    NO GROWTH 2 DAYS Performed at [facility] [address], [place], [place] [address]    Report Status PENDING  Incomplete     Time coordinating discharge: 35 minutes  SIGNED:   [name], MD  [facility] 07/31/2020, 10:34 AM

## 2020-07-31 NOTE — Progress Notes (Signed)
Patient will be discharging home with wife later this morning. Medication education will be provided.

## 2020-08-02 LAB — CULTURE, BLOOD (ROUTINE X 2)
Culture: NO GROWTH
Culture: NO GROWTH
Special Requests: ADEQUATE
Special Requests: ADEQUATE

## 2020-09-27 ENCOUNTER — Other Ambulatory Visit (HOSPITAL_BASED_OUTPATIENT_CLINIC_OR_DEPARTMENT_OTHER): Payer: Self-pay

## 2020-10-28 ENCOUNTER — Ambulatory Visit: Payer: No Typology Code available for payment source | Attending: Internal Medicine

## 2020-10-28 DIAGNOSIS — Z23 Encounter for immunization: Secondary | ICD-10-CM

## 2020-10-28 NOTE — Progress Notes (Signed)
   Covid-19 Vaccination Clinic  Name:  Jordan Hoffman    MRN: [id number] DOB: Mar 23, 1928  10/28/2020  [name] was observed post Covid-19 immunization for 15 minutes without incident. He was provided with Vaccine Information Sheet and instruction to access the V-Safe system.   [name] was instructed to call 911 with any severe reactions post vaccine: Difficulty breathing  Swelling of face and throat  A fast heartbeat  A bad rash all over body  Dizziness and weakness

## 2020-11-19 ENCOUNTER — Other Ambulatory Visit (HOSPITAL_BASED_OUTPATIENT_CLINIC_OR_DEPARTMENT_OTHER): Payer: Self-pay

## 2020-11-19 MED ORDER — PFIZER COVID-19 VAC BIVALENT 30 MCG/0.3ML IM SUSP
INTRAMUSCULAR | 0 refills | Status: DC
  Filled 2020-11-19: qty 0.3, 1d supply, fill #0

## 2020-12-21 ENCOUNTER — Emergency Department (HOSPITAL_COMMUNITY): Payer: No Typology Code available for payment source

## 2020-12-21 ENCOUNTER — Other Ambulatory Visit: Payer: Self-pay

## 2020-12-21 ENCOUNTER — Emergency Department (HOSPITAL_COMMUNITY)
Admission: EM | Admit: 2020-12-21 | Discharge: 2020-12-21 | Disposition: A | Discharge status: Home / Self Care | Payer: No Typology Code available for payment source | Attending: Emergency Medicine | Admitting: Emergency Medicine

## 2020-12-21 ENCOUNTER — Encounter (HOSPITAL_COMMUNITY): Payer: Self-pay

## 2020-12-21 DIAGNOSIS — I480 Paroxysmal atrial fibrillation: Secondary | ICD-10-CM | POA: Insufficient documentation

## 2020-12-21 DIAGNOSIS — J449 Chronic obstructive pulmonary disease, unspecified: Secondary | ICD-10-CM | POA: Diagnosis not present

## 2020-12-21 DIAGNOSIS — J40 Bronchitis, not specified as acute or chronic: Secondary | ICD-10-CM | POA: Diagnosis not present

## 2020-12-21 DIAGNOSIS — Z79899 Other long term (current) drug therapy: Secondary | ICD-10-CM | POA: Insufficient documentation

## 2020-12-21 DIAGNOSIS — Z7951 Long term (current) use of inhaled steroids: Secondary | ICD-10-CM | POA: Insufficient documentation

## 2020-12-21 DIAGNOSIS — E1122 Type 2 diabetes mellitus with diabetic chronic kidney disease: Secondary | ICD-10-CM | POA: Diagnosis not present

## 2020-12-21 DIAGNOSIS — Z7901 Long term (current) use of anticoagulants: Secondary | ICD-10-CM | POA: Insufficient documentation

## 2020-12-21 DIAGNOSIS — Z87891 Personal history of nicotine dependence: Secondary | ICD-10-CM | POA: Diagnosis not present

## 2020-12-21 DIAGNOSIS — Z96642 Presence of left artificial hip joint: Secondary | ICD-10-CM | POA: Insufficient documentation

## 2020-12-21 DIAGNOSIS — R509 Fever, unspecified: Secondary | ICD-10-CM | POA: Diagnosis present

## 2020-12-21 DIAGNOSIS — I129 Hypertensive chronic kidney disease with stage 1 through stage 4 chronic kidney disease, or unspecified chronic kidney disease: Secondary | ICD-10-CM | POA: Diagnosis not present

## 2020-12-21 DIAGNOSIS — N1832 Chronic kidney disease, stage 3b: Secondary | ICD-10-CM | POA: Insufficient documentation

## 2020-12-21 DIAGNOSIS — Z20822 Contact with and (suspected) exposure to covid-19: Secondary | ICD-10-CM | POA: Diagnosis not present

## 2020-12-21 LAB — CBC WITH DIFFERENTIAL/PLATELET
Abs Immature Granulocytes: 0.03 10*3/uL (ref 0.00–0.07)
Basophils Absolute: 0 10*3/uL (ref 0.0–0.1)
Basophils Relative: 0 %
Eosinophils Absolute: 0 10*3/uL (ref 0.0–0.5)
Eosinophils Relative: 0 %
HCT: 39.4 % (ref 39.0–52.0)
Hemoglobin: 12.5 g/dL — ABNORMAL LOW (ref 13.0–17.0)
Immature Granulocytes: 0 %
Lymphocytes Relative: 11 %
Lymphs Abs: 0.8 10*3/uL (ref 0.7–4.0)
MCH: 27.5 pg (ref 26.0–34.0)
MCHC: 31.7 g/dL (ref 30.0–36.0)
MCV: 86.8 fL (ref 80.0–100.0)
Monocytes Absolute: 1.1 10*3/uL — ABNORMAL HIGH (ref 0.1–1.0)
Monocytes Relative: 14 %
Neutro Abs: 5.9 10*3/uL (ref 1.7–7.7)
Neutrophils Relative %: 75 %
Platelets: 178 10*3/uL (ref 150–400)
RBC: 4.54 MIL/uL (ref 4.22–5.81)
RDW: 16.1 % — ABNORMAL HIGH (ref 11.5–15.5)
WBC: 7.9 10*3/uL (ref 4.0–10.5)
nRBC: 0 % (ref 0.0–0.2)

## 2020-12-21 LAB — URINALYSIS, ROUTINE W REFLEX MICROSCOPIC
Bilirubin Urine: NEGATIVE
Glucose, UA: NEGATIVE mg/dL
Hgb urine dipstick: NEGATIVE
Ketones, ur: NEGATIVE mg/dL
Nitrite: NEGATIVE
Protein, ur: NEGATIVE mg/dL
Specific Gravity, Urine: 1.015 (ref 1.005–1.030)
pH: 5.5 (ref 5.0–8.0)

## 2020-12-21 LAB — COMPREHENSIVE METABOLIC PANEL
ALT: 13 U/L (ref 0–44)
AST: 17 U/L (ref 15–41)
Albumin: 3.7 g/dL (ref 3.5–5.0)
Alkaline Phosphatase: 74 U/L (ref 38–126)
Anion gap: 9 (ref 5–15)
BUN: 37 mg/dL — ABNORMAL HIGH (ref 8–23)
CO2: 25 mmol/L (ref 22–32)
Calcium: 9.7 mg/dL (ref 8.9–10.3)
Chloride: 102 mmol/L (ref 98–111)
Creatinine, Ser: 2 mg/dL — ABNORMAL HIGH (ref 0.61–1.24)
GFR, Estimated: 31 mL/min — ABNORMAL LOW (ref >60)
Glucose, Bld: 121 mg/dL — ABNORMAL HIGH (ref 70–99)
Potassium: 4.1 mmol/L (ref 3.5–5.1)
Sodium: 136 mmol/L (ref 135–145)
Total Bilirubin: 2.1 mg/dL — ABNORMAL HIGH (ref 0.3–1.2)
Total Protein: 7.8 g/dL (ref 6.5–8.1)

## 2020-12-21 LAB — LACTIC ACID, PLASMA
Lactic Acid, Venous: 1.2 mmol/L (ref 0.5–1.9)
Lactic Acid, Venous: 1.4 mmol/L (ref 0.5–1.9)

## 2020-12-21 LAB — APTT: aPTT: 40 seconds — ABNORMAL HIGH (ref 24–36)

## 2020-12-21 LAB — RESP PANEL BY RT-PCR (FLU A&B, COVID) ARPGX2
Influenza A by PCR: NEGATIVE
Influenza B by PCR: NEGATIVE
SARS Coronavirus 2 by RT PCR: NEGATIVE

## 2020-12-21 LAB — PROTIME-INR
INR: 1.4 — ABNORMAL HIGH (ref 0.8–1.2)
Prothrombin Time: 17.6 seconds — ABNORMAL HIGH (ref 11.4–15.2)

## 2020-12-21 MED ORDER — DOXYCYCLINE HYCLATE 100 MG PO CAPS: 100.0000 mg | ORAL_CAPSULE | Freq: Two times a day (BID) | ORAL | 0 refills | Status: DC

## 2020-12-21 MED ORDER — ACETAMINOPHEN 325 MG PO TABS
650.0000 mg | ORAL_TABLET | Freq: Once | ORAL | Status: AC
  Administered 2020-12-21: 650 mg via ORAL
  Filled 2020-12-21: qty 2

## 2020-12-21 MED ORDER — LACTATED RINGERS IV SOLN: INTRAVENOUS | Status: DC

## 2020-12-21 MED ORDER — DOXYCYCLINE HYCLATE 100 MG PO TABS
100.0000 mg | ORAL_TABLET | Freq: Once | ORAL | Status: AC
  Administered 2020-12-21: 100 mg via ORAL
  Filled 2020-12-21: qty 1

## 2020-12-21 NOTE — ED Notes (Signed)
MD at bedside. 

## 2020-12-21 NOTE — ED Triage Notes (Signed)
Patient c/o fatigue, sore throat, fever, and a productive cough with brown sputum since yesterday.  Patient states his temp at home was 100.4 and he took Tylenol 325 mg x 3  tabs prior to arrival to the ED.

## 2020-12-21 NOTE — ED Provider Notes (Signed)
Pt signed out by Dr. Zenia Resides awaiting urine.  Urine neg for nitrites, trace leuks.  He did have many bacteria, but is not symptomatic.  Pt either has a bronchitis or a false neg flu or an early pneumonia.  Pt d/c with doxy.  He knows to return if worse.  F/u with pcp.   Isla Pence, MD 12/21/20 1700

## 2020-12-21 NOTE — Discharge Instructions (Addendum)
Call the [facility] tomorrow to schedule an appointment to be seen.  Return here for any trouble breathing or worsening cough up blood

## 2020-12-21 NOTE — ED Provider Notes (Signed)
[facility] DEPT Provider Note   CSN: [id number] Arrival date & time: 12/21/20  1047     History Chief Complaint  Patient presents with   Fatigue   Fever   Cough   Sore Throat    Jordan Hoffman is a 87 y.o. male.  87 year old male who presents with several days of cough and congestion.  Has had some increasing dyspnea exertion.  Cough is productive of brown sputum.  History of pneumonia and this is similar.  Has medicated with OTCs without relief.  Denies any vomiting or diarrhea.  No abdominal or chest discomfort.  Denies any urinary symptoms.      Past Medical History:  Diagnosis Date   Atrial fibrillation ([place])    Back pain    COPD (chronic obstructive pulmonary disease) ([place])    Diabetes mellitus without complication ([place])    High cholesterol    Hypertension    Knee pain     Patient Active Problem List   Diagnosis Date Noted   Bacterial pneumonia 07/28/2020   Hypoxia 07/28/2020   Paroxysmal atrial fibrillation ([place]) 04/09/2020   ARF (acute renal failure) ([place]) 04/09/2020   Acute renal failure superimposed on stage 3 chronic kidney disease, unspecified acute renal failure type, unspecified whether stage 3a or 3b CKD ([place]) 04/08/2020   Near syncope 12/11/2015   Atrial fibrillation ([place]) 12/11/2015   COPD (chronic obstructive pulmonary disease) ([place]) 12/11/2015   OSA (obstructive sleep apnea) 12/11/2015   Hypertension 12/11/2015   Type 2 diabetes mellitus ([place]) 12/11/2015   Hyperlipidemia 12/11/2015   Lumbosacral spondylosis without myelopathy 10/11/2015   Osteoarthritis 06/21/2015    Past Surgical History:  Procedure Laterality Date   BACK SURGERY     HIP SURGERY     JOINT REPLACEMENT Left    left hip replacement   MOUTH SURGERY     REPLACEMENT TOTAL KNEE Right    TRANSURETHRAL RESECTION OF PROSTATE         Family History  Problem Relation Age of Onset   Lung cancer Mother        Never smoked   Lung cancer Father     Stroke Sister    Lung cancer Brother    Kidney disease Brother    Heart attack Neg Hx     Social History   Tobacco Use   Smoking status: Former   Smokeless tobacco: Never  Scientific laboratory technician Use: Never used  Substance Use Topics   Alcohol use: No   Drug use: No    Home Medications Prior to Admission medications   Medication Sig Start Date End Date Taking? Authorizing Provider  acetaminophen (TYLENOL) 325 MG tablet Take 975 mg by mouth 2 (two) times daily.    [provider]  albuterol (PROVENTIL) (2.5 MG/3ML) 0.083% nebulizer solution Take 2.5 mg by nebulization every 6 (six) hours as needed for wheezing or shortness of breath.    [provider]  albuterol (VENTOLIN HFA) 108 (90 Base) MCG/ACT inhaler Inhale 2 puffs into the lungs every 6 (six) hours as needed for wheezing or shortness of breath.    [provider]  amiodarone (PACERONE) 200 MG tablet Take 200 mg by mouth daily. 02/11/20   [provider]  amLODipine (NORVASC) 10 MG tablet Take 10 mg by mouth daily.    [provider]  apixaban (ELIQUIS) 2.5 MG TABS tablet Take 1 tablet (2.5 mg total) by mouth 2 (two) times daily. 07/31/20   Jordan August,  MD  Calcium Carbonate-Vitamin D3 600-400 MG-UNIT TABS Take 1 tablet by mouth 2 (two) times daily.    [provider]  CALCIUM PO Take 1 tablet by mouth daily.    [provider]  carboxymethylcellulose (REFRESH PLUS) 0.5 % SOLN Place 1 drop into both eyes 4 (four) times daily. 08/04/19   [provider]  cholecalciferol (VITAMIN D) 25 MCG (1000 UNIT) tablet Take 1,000 Units by mouth daily.    [provider]  COVID-19 mRNA bivalent vaccine, Pfizer, (PFIZER COVID-19 VAC BIVALENT) injection Inject into the muscle. 10/28/20   [name], MD  diphenhydrAMINE (BENADRYL) 25 MG tablet Take 25 mg by mouth every 6 (six) hours as needed for sleep.    [provider]  fluticasone (FLONASE) 50 MCG/ACT  nasal spray Place 2 sprays into both nostrils daily as needed for allergies or rhinitis.    [provider]  Fluticasone-Salmeterol (ADVAIR) 250-50 MCG/DOSE AEPB Inhale 1 puff into the lungs 2 (two) times daily.    [provider]  furosemide (LASIX) 40 MG tablet Take 20 mg by mouth daily at 6 (six) AM. 01/06/20   [provider]  gabapentin (NEURONTIN) 300 MG capsule Take 300 mg by mouth 2 (two) times daily.    [provider]  guaiFENesin (MUCINEX) 600 MG 12 hr tablet Take 600 mg by mouth 2 (two) times daily.    [provider]  guaiFENesin-dextromethorphan (ROBITUSSIN DM) 100-10 MG/5ML syrup Take 10 mLs by mouth every 4 (four) hours as needed for cough. 07/31/20   Jordan August, MD  latanoprost (XALATAN) 0.005 % ophthalmic solution Place 1 drop into both eyes at bedtime.    [provider]  loratadine (CLARITIN) 10 MG tablet Take 10 mg by mouth daily.    [provider]  Menthol-Methyl Salicylate (THERA-GESIC EX) Apply 1 application topically every 6 (six) hours as needed (pain AVOID FACE AND EYES - [place] HANDS AFTER USING).    [provider]  omeprazole (PRILOSEC) 20 MG capsule Take 20 mg by mouth daily.    [provider]  psyllium (METAMUCIL) 58.6 % powder Take 1 packet by mouth daily.    [provider]  Skin Protectants, Misc. (HYDROCERIN EX) Apply 1 application topically daily as needed (dry skin).    [provider]  sodium chloride (OCEAN) 0.65 % SOLN nasal spray Place 2 sprays into both nostrils 2 (two) times daily as needed for congestion.    [provider]  terbinafine (LAMISIL) 1 % cream Apply 1 application topically 2 (two) times daily as needed (between toes).    [provider]  Tiotropium Bromide Monohydrate 2.5 MCG/ACT AERS Inhale 2 puffs into the lungs daily.    [provider]  urea (CARMOL) 20 % cream Apply 1 application topically 2 (two) times daily as  needed (dry skin on feet DO NOT USE BETWEEN TOES).    [provider]    Allergies    Lisinopril, Penicillins, Pravastatin, Sulfa antibiotics, Atorvastatin, Codeine, Flunisolide, Loratadine, Niacin and related, Niaspan [niacin], Simvastatin, Statins, Ciprofloxacin, Erythromycin, and Ranitidine  Review of Systems   Review of Systems  All other systems reviewed and are negative.  Physical Exam Updated Vital Signs BP 111/77   Pulse (!) 133   Temp (!) 101 F (38.3 C) (Oral)   Ht 1.93 m (6\' 4" )   Wt 88 kg   SpO2 95%   BMI 23.61 kg/m   Physical Exam Vitals and nursing note reviewed.  Constitutional:  General: He is not in acute distress.    Appearance: Normal appearance. He is well-developed. He is not toxic-appearing.  HENT:     Head: Normocephalic and atraumatic.  Eyes:     General: Lids are normal.     Conjunctiva/sclera: Conjunctivae normal.     Pupils: Pupils are equal, round, and reactive to light.  Neck:     Thyroid: No thyroid mass.     Trachea: No tracheal deviation.  Cardiovascular:     Rate and Rhythm: Normal rate and regular rhythm.     Heart sounds: Normal heart sounds. No murmur heard.   No gallop.  Pulmonary:     Effort: Pulmonary effort is normal. No respiratory distress.     Breath sounds: No stridor. Examination of the left-lower field reveals decreased breath sounds. Decreased breath sounds present. No wheezing, rhonchi or rales.  Abdominal:     General: There is no distension.     Palpations: Abdomen is soft.     Tenderness: There is no abdominal tenderness. There is no rebound.  Musculoskeletal:        General: No tenderness. Normal range of motion.     Cervical back: Normal range of motion and neck supple.  Skin:    General: Skin is warm and dry.     Findings: No abrasion or rash.  Neurological:     Mental Status: He is alert and oriented to person, place, and time. Mental status is at baseline.     GCS: GCS eye subscore is 4. GCS  verbal subscore is 5. GCS motor subscore is 6.     Cranial Nerves: No cranial nerve deficit.     Sensory: No sensory deficit.     Motor: Motor function is intact.  Psychiatric:        Attention and Perception: Attention normal.        Speech: Speech normal.        Behavior: Behavior normal.    ED Results / Procedures / Treatments   Labs (all labs ordered are listed, but only abnormal results are displayed) Labs Reviewed  CULTURE, BLOOD (ROUTINE X 2)  CULTURE, BLOOD (ROUTINE X 2)  URINE CULTURE  RESP PANEL BY RT-PCR (FLU A&B, COVID) ARPGX2  LACTIC ACID, PLASMA  LACTIC ACID, PLASMA  COMPREHENSIVE METABOLIC PANEL  CBC WITH DIFFERENTIAL/PLATELET  PROTIME-INR  APTT  URINALYSIS, ROUTINE W REFLEX MICROSCOPIC    EKG None  Radiology No results found.  Procedures Procedures   Medications Ordered in ED Medications  lactated ringers infusion (has no administration in time range)  acetaminophen (TYLENOL) tablet 650 mg (has no administration in time range)    ED Course  I have reviewed the triage vital signs and the nursing notes.  Pertinent labs & imaging results that were available during my care of the patient were reviewed by me and considered in my medical decision making (see chart for details).    MDM Rules/Calculators/A&P                           Chest x-ray without acute findings here.  COVID and flu test negative.  Lactate normal.  Patient did have some slight hemoptysis here but is not short of breath.  Patient does take Eliquis.  Concern for possible pneumonia has not shown up  yet.  Plan will be to start patient antibiotics and strict return precautions given Final Clinical Impression(s) / ED Diagnoses Final diagnoses:  None  Rx / DC Orders ED Discharge Orders     None        Lacretia Leigh, MD 12/21/20 1640

## 2020-12-24 LAB — URINE CULTURE: Culture: >=100000 — AB

## 2020-12-25 ENCOUNTER — Telehealth: Payer: Self-pay | Admitting: Emergency Medicine

## 2020-12-25 NOTE — Progress Notes (Signed)
ED Antimicrobial Stewardship Positive Culture Follow Up   Jordan Hoffman is an 87 y.o. male who presented to [facility] on 12/21/2020 with a chief complaint of  Chief Complaint  Patient presents with   Fatigue   Fever   Cough   Sore Throat    Recent Results (from the past 720 hour(s))  Blood Culture (routine x 2)     Status: None (Preliminary result)   Collection Time: 12/21/20 12:17 PM   Specimen: BLOOD  Result Value Ref Range Status   Specimen Description   Final    BLOOD RIGHT ANTECUBITAL Performed at [facility], [place] [address] [place], [place] [address]    Special Requests   Final    BOTTLES DRAWN AEROBIC AND ANAEROBIC Blood Culture adequate volume Performed at [place] [address] [place], [place] [address]    Culture   Final    NO GROWTH 3 DAYS Performed at [facility], [place] [address], [place], [place] [address]    Report Status PENDING  Incomplete  Urine Culture     Status: Abnormal   Collection Time: 12/21/20 12:17 PM   Specimen: In/Out Cath Urine  Result Value Ref Range Status   Specimen Description   Final    IN/OUT CATH URINE Performed at [place] [address], [place], [place] [address]    Special Requests   Final    NONE Performed at [facility], [place] [address], [place], [place] [address]    Culture >=100,000 COLONIES/mL ESCHERICHIA COLI (A)  Final   Report Status 12/24/2020 FINAL  Final   Organism ID, Bacteria ESCHERICHIA COLI (A)  Final      Susceptibility   Escherichia coli - MIC*    AMPICILLIN >=32 RESISTANT Resistant     CEFAZOLIN 32 INTERMEDIATE Intermediate     CEFEPIME <=0.12 SENSITIVE Sensitive     CEFTRIAXONE 8 RESISTANT Resistant     CIPROFLOXACIN <=0.25 SENSITIVE Sensitive     GENTAMICIN <=1 SENSITIVE Sensitive     IMIPENEM <=0.25 SENSITIVE Sensitive     NITROFURANTOIN <=16 SENSITIVE Sensitive     TRIMETH/SULFA >=320 RESISTANT Resistant      AMPICILLIN/SULBACTAM <=2 SENSITIVE Sensitive     PIP/TAZO <=4 SENSITIVE Sensitive     * >=100,000 COLONIES/mL ESCHERICHIA COLI  Resp Panel by RT-PCR (Flu A&B, Covid) Nasopharyngeal Swab     Status: None   Collection Time: 12/21/20 12:18 PM   Specimen: Nasopharyngeal Swab; Nasopharyngeal(NP) swabs in vial transport medium  Result Value Ref Range Status   SARS Coronavirus 2 by RT PCR NEGATIVE NEGATIVE Final    Comment: (NOTE) SARS-CoV-2 target nucleic acids are NOT DETECTED.  The SARS-CoV-2 RNA is generally detectable in upper respiratory specimens during the acute phase of infection. The lowest concentration of SARS-CoV-2 viral copies this assay can detect is 138 copies/mL. A negative result does not preclude SARS-Cov-2 infection and should not be used as the sole basis for treatment or other patient management decisions. A negative result may occur with  improper specimen collection/handling, submission of specimen other than nasopharyngeal swab, presence of viral mutation(s) within the areas targeted by this assay, and inadequate number of viral copies(<138 copies/mL). A negative result must be combined with clinical observations, patient history, and epidemiological information. The expected result is Negative.  Fact Sheet for Patients:  EntrepreneurPulse.com.au  Fact Sheet for Healthcare Providers:  IncredibleEmployment.be  This test is no t yet approved or cleared by the  [place] Architectural technologist and  has been authorized for detection and/or diagnosis of SARS-CoV-2 by FDA under an Print production planner (EUA). This EUA will remain  in effect (meaning this test can be used) for the duration of the COVID-19 declaration under Section 564(b)(1) of the Act, 21 U.S.C.section 360bbb-3(b)(1), unless the authorization is terminated  or revoked sooner.       Influenza A by PCR NEGATIVE NEGATIVE Final   Influenza B by PCR NEGATIVE NEGATIVE Final     Comment: (NOTE) The Xpert Xpress SARS-CoV-2/FLU/RSV plus assay is intended as an aid in the diagnosis of influenza from Nasopharyngeal swab specimens and should not be used as a sole basis for treatment. Nasal washings and aspirates are unacceptable for Xpert Xpress SARS-CoV-2/FLU/RSV testing.  Fact Sheet for Patients: EntrepreneurPulse.com.au  Fact Sheet for Healthcare Providers: IncredibleEmployment.be  This test is not yet approved or cleared by the [place] FDA and has been authorized for detection and/or diagnosis of SARS-CoV-2 by FDA under an Emergency Use Authorization (EUA). This EUA will remain in effect (meaning this test can be used) for the duration of the COVID-19 declaration under Section 564(b)(1) of the Act, 21 U.S.C. section 360bbb-3(b)(1), unless the authorization is terminated or revoked.  Performed at [facility], [place] [address], [place], [place] [address]   Blood Culture (routine x 2)     Status: None (Preliminary result)   Collection Time: 12/21/20  3:45 PM   Specimen: BLOOD RIGHT FOREARM  Result Value Ref Range Status   Specimen Description   Final    BLOOD RIGHT FOREARM Performed at [facility], [place] [address], [place], [place] [address]    Special Requests   Final    BOTTLES DRAWN AEROBIC AND ANAEROBIC Blood Culture adequate volume Performed at [place] [address] [place], [place] [address]    Culture   Final    NO GROWTH 3 DAYS Performed at [facility], [place] [address] [place], [place] [address]    Report Status PENDING  Incomplete    [x]  Treated with doxycycline for bronchitis vs early pneumonia, urinary organism resistant to prescribed antimicrobial []  Patient discharged originally without antimicrobial agent and treatment is now indicated  New antibiotic prescription: None, patient without urinary symptoms, therefore, no treatment  indicated  ED Provider: Mervyn Gay, PA-C   Peggyann Juba, PharmD, BCPS 12/25/2020, 8:54 AM Clinical Pharmacist [phone]

## 2020-12-25 NOTE — Telephone Encounter (Signed)
Post ED Visit - Positive Culture Follow-up  Culture report reviewed by antimicrobial stewardship pharmacist: [place] Team []  Elenor Quinones, Pharm.D. []  Heide Guile, Pharm.D., BCPS AQ-ID []  Parks Neptune, Pharm.D., BCPS []  Alycia Rossetti, Pharm.D., BCPS []  [place], Pharm.D., BCPS, AAHIVP []  Legrand Como, Pharm.D., BCPS, AAHIVP []  Salome Arnt, PharmD, BCPS []  Johnnette Gourd, PharmD, BCPS []  [place] Better, PharmD, BCPS []  Leeroy Cha, PharmD []  Laqueta Linden, PharmD, BCPS []  Albertina Parr, PharmD  [place] Team []  Leodis Sias, PharmD []  Lindell Spar, PharmD []  Royetta Asal, PharmD []  Graylin Shiver, Rph []  Rema Fendt) Glennon Mac, PharmD []  Arlyn Dunning, PharmD []  Netta Cedars, PharmD []  Dia Sitter, PharmD []  Leone Haven, PharmD []  Gretta Arab, PharmD []  Theodis Shove, PharmD [x]  Peggyann Juba, PharmD []  Reuel Boom, PharmD   Positive urine culture No further patient follow-up is required at this time. Madison Redwine PA-C  Santana Edell C Tobias Avitabile 12/25/2020, 3:10 PM

## 2020-12-26 LAB — CULTURE, BLOOD (ROUTINE X 2)
Culture: NO GROWTH
Culture: NO GROWTH
Special Requests: ADEQUATE
Special Requests: ADEQUATE

## 2021-07-13 ENCOUNTER — Ambulatory Visit: Payer: No Typology Code available for payment source | Attending: Internal Medicine

## 2021-07-13 DIAGNOSIS — Z23 Encounter for immunization: Secondary | ICD-10-CM

## 2021-07-13 NOTE — Progress Notes (Signed)
   Covid-19 Vaccination Clinic  Name:  Jordan Hoffman    MRN: [id number] DOB: 1928/12/15  07/13/2021  Jordan Hoffman was observed post Covid-19 immunization for 15 minutes without incident. He was provided with Vaccine Information Sheet and instruction to access the V-Safe system.   Jordan Hoffman was instructed to call 911 with any severe reactions post vaccine: Difficulty breathing  Swelling of face and throat  A fast heartbeat  A bad rash all over body  Dizziness and weakness   Immunizations Administered     Name Date Dose VIS Date Route   Pfizer Covid-19 Vaccine Bivalent Booster 07/13/2021  2:38 PM 0.3 mL 09/15/2020 Intramuscular   Manufacturer: [place]   Lot: [id number]   [place]: [id number]

## 2021-07-18 ENCOUNTER — Other Ambulatory Visit (HOSPITAL_BASED_OUTPATIENT_CLINIC_OR_DEPARTMENT_OTHER): Payer: Self-pay

## 2021-07-18 MED ORDER — PFIZER COVID-19 VAC BIVALENT 30 MCG/0.3ML IM SUSP
INTRAMUSCULAR | 0 refills | Status: DC
  Filled 2021-07-18: qty 0.3, 1d supply, fill #0

## 2021-09-25 ENCOUNTER — Other Ambulatory Visit: Payer: Self-pay

## 2021-09-25 ENCOUNTER — Inpatient Hospital Stay (HOSPITAL_COMMUNITY)
Admission: EM | Admit: 2021-09-25 | Discharge: 2021-09-28 | DRG: 177 | Disposition: A | Discharge status: Skilled Nursing Facility | Payer: No Typology Code available for payment source | Source: Skilled Nursing Facility | Attending: Internal Medicine | Admitting: Internal Medicine

## 2021-09-25 ENCOUNTER — Encounter (HOSPITAL_COMMUNITY): Payer: Self-pay

## 2021-09-25 ENCOUNTER — Inpatient Hospital Stay (HOSPITAL_COMMUNITY): Payer: No Typology Code available for payment source

## 2021-09-25 ENCOUNTER — Emergency Department (HOSPITAL_COMMUNITY): Payer: No Typology Code available for payment source

## 2021-09-25 DIAGNOSIS — I480 Paroxysmal atrial fibrillation: Secondary | ICD-10-CM | POA: Diagnosis present

## 2021-09-25 DIAGNOSIS — Z79899 Other long term (current) drug therapy: Secondary | ICD-10-CM | POA: Diagnosis not present

## 2021-09-25 DIAGNOSIS — N179 Acute kidney failure, unspecified: Secondary | ICD-10-CM | POA: Diagnosis present

## 2021-09-25 DIAGNOSIS — Z880 Allergy status to penicillin: Secondary | ICD-10-CM

## 2021-09-25 DIAGNOSIS — I5043 Acute on chronic combined systolic (congestive) and diastolic (congestive) heart failure: Secondary | ICD-10-CM | POA: Diagnosis not present

## 2021-09-25 DIAGNOSIS — J1282 Pneumonia due to coronavirus disease 2019: Secondary | ICD-10-CM | POA: Diagnosis present

## 2021-09-25 DIAGNOSIS — G9341 Metabolic encephalopathy: Secondary | ICD-10-CM | POA: Diagnosis present

## 2021-09-25 DIAGNOSIS — Z9981 Dependence on supplemental oxygen: Secondary | ICD-10-CM

## 2021-09-25 DIAGNOSIS — I4891 Unspecified atrial fibrillation: Secondary | ICD-10-CM | POA: Diagnosis present

## 2021-09-25 DIAGNOSIS — R778 Other specified abnormalities of plasma proteins: Secondary | ICD-10-CM | POA: Diagnosis not present

## 2021-09-25 DIAGNOSIS — Z888 Allergy status to other drugs, medicaments and biological substances status: Secondary | ICD-10-CM | POA: Diagnosis not present

## 2021-09-25 DIAGNOSIS — I69351 Hemiplegia and hemiparesis following cerebral infarction affecting right dominant side: Secondary | ICD-10-CM

## 2021-09-25 DIAGNOSIS — N1831 Chronic kidney disease, stage 3a: Secondary | ICD-10-CM | POA: Diagnosis present

## 2021-09-25 DIAGNOSIS — J449 Chronic obstructive pulmonary disease, unspecified: Secondary | ICD-10-CM | POA: Diagnosis present

## 2021-09-25 DIAGNOSIS — Z881 Allergy status to other antibiotic agents status: Secondary | ICD-10-CM

## 2021-09-25 DIAGNOSIS — Z801 Family history of malignant neoplasm of trachea, bronchus and lung: Secondary | ICD-10-CM

## 2021-09-25 DIAGNOSIS — I130 Hypertensive heart and chronic kidney disease with heart failure and stage 1 through stage 4 chronic kidney disease, or unspecified chronic kidney disease: Secondary | ICD-10-CM | POA: Diagnosis present

## 2021-09-25 DIAGNOSIS — Z6822 Body mass index (BMI) 22.0-22.9, adult: Secondary | ICD-10-CM

## 2021-09-25 DIAGNOSIS — E785 Hyperlipidemia, unspecified: Secondary | ICD-10-CM | POA: Diagnosis present

## 2021-09-25 DIAGNOSIS — U071 COVID-19: Principal | ICD-10-CM | POA: Diagnosis present

## 2021-09-25 DIAGNOSIS — N183 Chronic kidney disease, stage 3 unspecified: Secondary | ICD-10-CM | POA: Diagnosis present

## 2021-09-25 DIAGNOSIS — Z841 Family history of disorders of kidney and ureter: Secondary | ICD-10-CM

## 2021-09-25 DIAGNOSIS — D631 Anemia in chronic kidney disease: Secondary | ICD-10-CM | POA: Diagnosis present

## 2021-09-25 DIAGNOSIS — E8809 Other disorders of plasma-protein metabolism, not elsewhere classified: Secondary | ICD-10-CM | POA: Diagnosis present

## 2021-09-25 DIAGNOSIS — J9601 Acute respiratory failure with hypoxia: Secondary | ICD-10-CM | POA: Diagnosis present

## 2021-09-25 DIAGNOSIS — G4733 Obstructive sleep apnea (adult) (pediatric): Secondary | ICD-10-CM | POA: Diagnosis present

## 2021-09-25 DIAGNOSIS — Z96651 Presence of right artificial knee joint: Secondary | ICD-10-CM | POA: Diagnosis present

## 2021-09-25 DIAGNOSIS — I5021 Acute systolic (congestive) heart failure: Secondary | ICD-10-CM | POA: Diagnosis present

## 2021-09-25 DIAGNOSIS — R0902 Hypoxemia: Secondary | ICD-10-CM | POA: Diagnosis present

## 2021-09-25 DIAGNOSIS — Z882 Allergy status to sulfonamides status: Secondary | ICD-10-CM

## 2021-09-25 DIAGNOSIS — I4819 Other persistent atrial fibrillation: Secondary | ICD-10-CM | POA: Diagnosis not present

## 2021-09-25 DIAGNOSIS — Z87891 Personal history of nicotine dependence: Secondary | ICD-10-CM | POA: Diagnosis not present

## 2021-09-25 DIAGNOSIS — Z7901 Long term (current) use of anticoagulants: Secondary | ICD-10-CM

## 2021-09-25 DIAGNOSIS — J441 Chronic obstructive pulmonary disease with (acute) exacerbation: Secondary | ICD-10-CM | POA: Diagnosis present

## 2021-09-25 DIAGNOSIS — I509 Heart failure, unspecified: Secondary | ICD-10-CM

## 2021-09-25 DIAGNOSIS — I429 Cardiomyopathy, unspecified: Secondary | ICD-10-CM | POA: Diagnosis present

## 2021-09-25 DIAGNOSIS — E1165 Type 2 diabetes mellitus with hyperglycemia: Secondary | ICD-10-CM | POA: Diagnosis present

## 2021-09-25 DIAGNOSIS — I4821 Permanent atrial fibrillation: Secondary | ICD-10-CM | POA: Diagnosis present

## 2021-09-25 DIAGNOSIS — I2510 Atherosclerotic heart disease of native coronary artery without angina pectoris: Secondary | ICD-10-CM | POA: Diagnosis present

## 2021-09-25 DIAGNOSIS — Z7951 Long term (current) use of inhaled steroids: Secondary | ICD-10-CM

## 2021-09-25 DIAGNOSIS — I248 Other forms of acute ischemic heart disease: Secondary | ICD-10-CM | POA: Diagnosis present

## 2021-09-25 DIAGNOSIS — Z885 Allergy status to narcotic agent status: Secondary | ICD-10-CM

## 2021-09-25 DIAGNOSIS — Z96642 Presence of left artificial hip joint: Secondary | ICD-10-CM | POA: Diagnosis present

## 2021-09-25 DIAGNOSIS — J440 Chronic obstructive pulmonary disease with acute lower respiratory infection: Secondary | ICD-10-CM | POA: Diagnosis present

## 2021-09-25 DIAGNOSIS — E440 Moderate protein-calorie malnutrition: Secondary | ICD-10-CM | POA: Diagnosis present

## 2021-09-25 DIAGNOSIS — Z823 Family history of stroke: Secondary | ICD-10-CM

## 2021-09-25 DIAGNOSIS — R4182 Altered mental status, unspecified: Secondary | ICD-10-CM | POA: Diagnosis present

## 2021-09-25 DIAGNOSIS — E7800 Pure hypercholesterolemia, unspecified: Secondary | ICD-10-CM | POA: Diagnosis present

## 2021-09-25 DIAGNOSIS — E1122 Type 2 diabetes mellitus with diabetic chronic kidney disease: Secondary | ICD-10-CM | POA: Diagnosis present

## 2021-09-25 LAB — LACTIC ACID, PLASMA
Lactic Acid, Venous: 2.7 mmol/L (ref 0.5–1.9)
Lactic Acid, Venous: 2.8 mmol/L (ref 0.5–1.9)
Lactic Acid, Venous: 2.5 mmol/L (ref 0.5–1.9)
Lactic Acid, Venous: 3.2 mmol/L (ref 0.5–1.9)

## 2021-09-25 LAB — CBC WITH DIFFERENTIAL/PLATELET
Abs Immature Granulocytes: 0.03 10*3/uL (ref 0.00–0.07)
Basophils Absolute: 0.1 10*3/uL (ref 0.0–0.1)
Basophils Relative: 1 %
Eosinophils Absolute: 0 10*3/uL (ref 0.0–0.5)
Eosinophils Relative: 0 %
HCT: 39.5 % (ref 39.0–52.0)
Hemoglobin: 12.4 g/dL — ABNORMAL LOW (ref 13.0–17.0)
Immature Granulocytes: 1 %
Lymphocytes Relative: 16 %
Lymphs Abs: 1.1 10*3/uL (ref 0.7–4.0)
MCH: 28.3 pg (ref 26.0–34.0)
MCHC: 31.4 g/dL (ref 30.0–36.0)
MCV: 90.2 fL (ref 80.0–100.0)
Monocytes Absolute: 0.4 10*3/uL (ref 0.1–1.0)
Monocytes Relative: 6 %
Neutro Abs: 5 10*3/uL (ref 1.7–7.7)
Neutrophils Relative %: 76 %
Platelets: 256 10*3/uL (ref 150–400)
RBC: 4.38 MIL/uL (ref 4.22–5.81)
RDW: 14.9 % (ref 11.5–15.5)
WBC: 6.6 10*3/uL (ref 4.0–10.5)
nRBC: 0 % (ref 0.0–0.2)

## 2021-09-25 LAB — GLUCOSE, CAPILLARY
Glucose-Capillary: 190 mg/dL — ABNORMAL HIGH (ref 70–99)
Glucose-Capillary: 214 mg/dL — ABNORMAL HIGH (ref 70–99)

## 2021-09-25 LAB — URINALYSIS, ROUTINE W REFLEX MICROSCOPIC
Bilirubin Urine: NEGATIVE
Glucose, UA: NEGATIVE mg/dL
Hgb urine dipstick: NEGATIVE
Ketones, ur: NEGATIVE mg/dL
Nitrite: NEGATIVE
Protein, ur: 100 mg/dL — AB
Specific Gravity, Urine: 1.014 (ref 1.005–1.030)
pH: 5 (ref 5.0–8.0)

## 2021-09-25 LAB — TROPONIN I (HIGH SENSITIVITY)
Troponin I (High Sensitivity): 65 ng/L — ABNORMAL HIGH (ref <18)
Troponin I (High Sensitivity): 75 ng/L — ABNORMAL HIGH (ref <18)
Troponin I (High Sensitivity): 84 ng/L — ABNORMAL HIGH (ref <18)
Troponin I (High Sensitivity): 85 ng/L — ABNORMAL HIGH (ref <18)

## 2021-09-25 LAB — COMPREHENSIVE METABOLIC PANEL
ALT: 13 U/L (ref 0–44)
AST: 22 U/L (ref 15–41)
Albumin: 3.2 g/dL — ABNORMAL LOW (ref 3.5–5.0)
Alkaline Phosphatase: 121 U/L (ref 38–126)
Anion gap: 10 (ref 5–15)
BUN: 19 mg/dL (ref 8–23)
CO2: 25 mmol/L (ref 22–32)
Calcium: 10.2 mg/dL (ref 8.9–10.3)
Chloride: 106 mmol/L (ref 98–111)
Creatinine, Ser: 1.33 mg/dL — ABNORMAL HIGH (ref 0.61–1.24)
GFR, Estimated: 50 mL/min — ABNORMAL LOW (ref >60)
Glucose, Bld: 178 mg/dL — ABNORMAL HIGH (ref 70–99)
Potassium: 4.4 mmol/L (ref 3.5–5.1)
Sodium: 141 mmol/L (ref 135–145)
Total Bilirubin: 1.8 mg/dL — ABNORMAL HIGH (ref 0.3–1.2)
Total Protein: 7.5 g/dL (ref 6.5–8.1)

## 2021-09-25 LAB — C-REACTIVE PROTEIN: CRP: 4.1 mg/dL — ABNORMAL HIGH (ref <1.0)

## 2021-09-25 LAB — CBG MONITORING, ED
Glucose-Capillary: 208 mg/dL — ABNORMAL HIGH (ref 70–99)
Glucose-Capillary: 209 mg/dL — ABNORMAL HIGH (ref 70–99)

## 2021-09-25 LAB — FIBRINOGEN: Fibrinogen: 609 mg/dL — ABNORMAL HIGH (ref 210–475)

## 2021-09-25 LAB — I-STAT ARTERIAL BLOOD GAS, ED
Acid-base deficit: 3 mmol/L — ABNORMAL HIGH (ref 0.0–2.0)
Bicarbonate: 22.2 mmol/L (ref 20.0–28.0)
Calcium, Ion: 1.36 mmol/L (ref 1.15–1.40)
HCT: 40 % (ref 39.0–52.0)
Hemoglobin: 13.6 g/dL (ref 13.0–17.0)
O2 Saturation: 95 %
Patient temperature: 98.3
Potassium: 4.2 mmol/L (ref 3.5–5.1)
Sodium: 140 mmol/L (ref 135–145)
TCO2: 23 mmol/L (ref 22–32)
pCO2 arterial: 38.3 mmHg (ref 32–48)
pH, Arterial: 7.37 (ref 7.35–7.45)
pO2, Arterial: 74 mmHg — ABNORMAL LOW (ref 83–108)

## 2021-09-25 LAB — SARS CORONAVIRUS 2 BY RT PCR: SARS Coronavirus 2 by RT PCR: POSITIVE — AB

## 2021-09-25 LAB — FERRITIN: Ferritin: 113 ng/mL (ref 24–336)

## 2021-09-25 LAB — SEDIMENTATION RATE: Sed Rate: 48 mm/hr — ABNORMAL HIGH (ref 0–16)

## 2021-09-25 LAB — MRSA NEXT GEN BY PCR, NASAL: MRSA by PCR Next Gen: NOT DETECTED

## 2021-09-25 LAB — BRAIN NATRIURETIC PEPTIDE: B Natriuretic Peptide: 1146.4 pg/mL — ABNORMAL HIGH (ref 0.0–100.0)

## 2021-09-25 LAB — D-DIMER, QUANTITATIVE: D-Dimer, Quant: 1 ug/mL-FEU — ABNORMAL HIGH (ref 0.00–0.50)

## 2021-09-25 LAB — LACTATE DEHYDROGENASE: LDH: 139 U/L (ref 98–192)

## 2021-09-25 LAB — PROCALCITONIN: Procalcitonin: <0.1 ng/mL

## 2021-09-25 LAB — MAGNESIUM: Magnesium: 2.4 mg/dL (ref 1.7–2.4)

## 2021-09-25 IMAGING — US US RENAL
2 series · 14 of 25 positions shown · non-contrast
Comparison: None.

CLINICAL DATA: Acute renal failure, hypertension

EXAM:
RENAL / URINARY TRACT ULTRASOUND COMPLETE

[Series 1: us renal · 13 of 42 slices shown (1 of 2)]
[im 1/42]
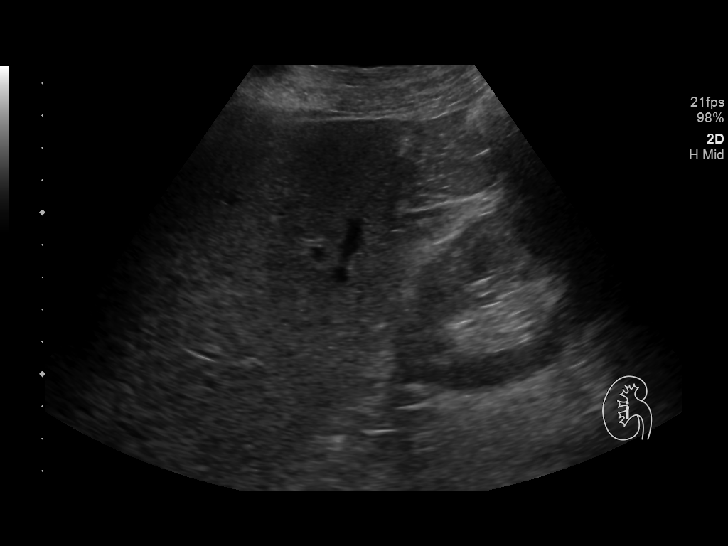
[im 4/42]
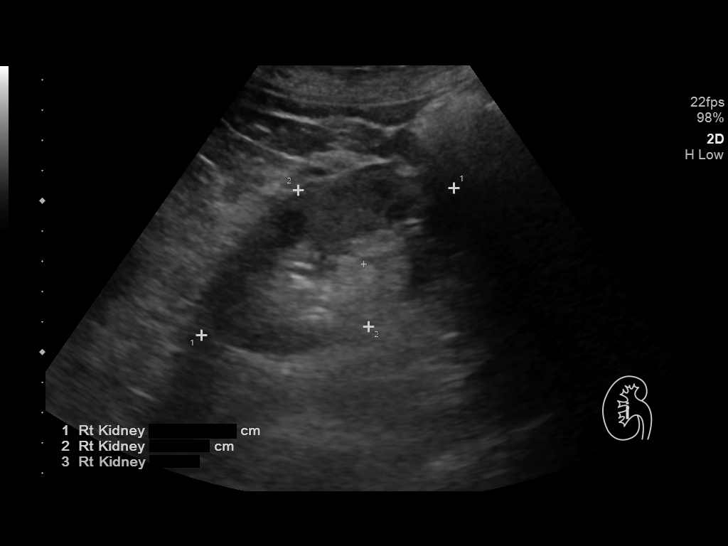
[im 8/42]
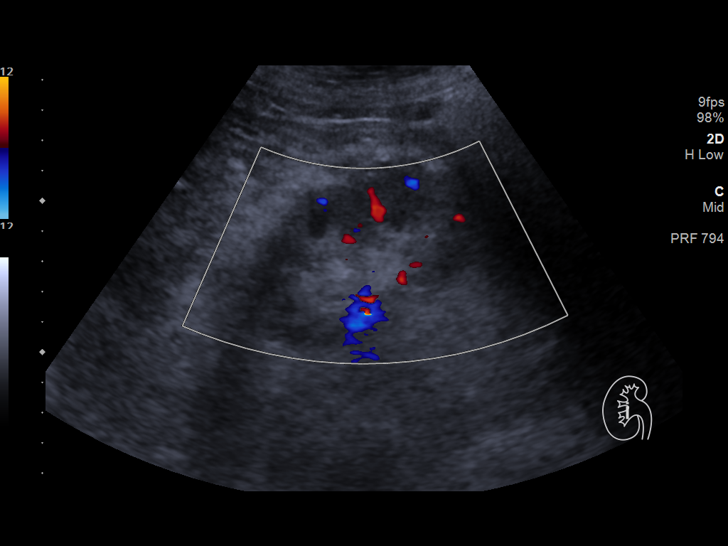
[im 11/42]
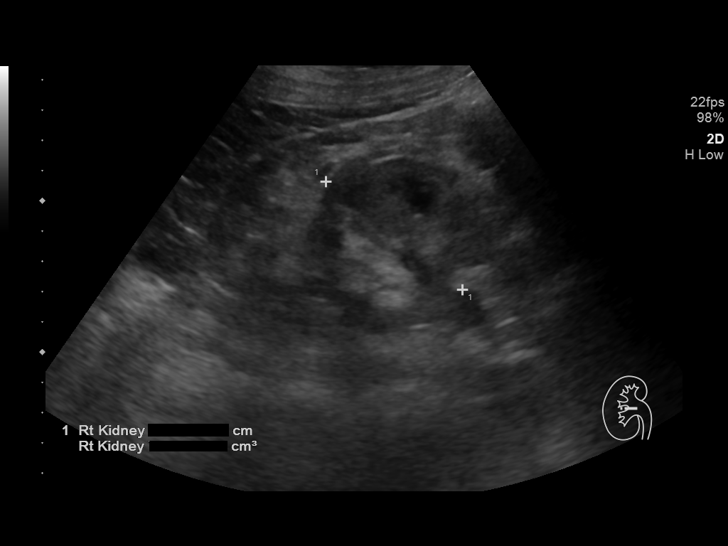
[im 15/42]
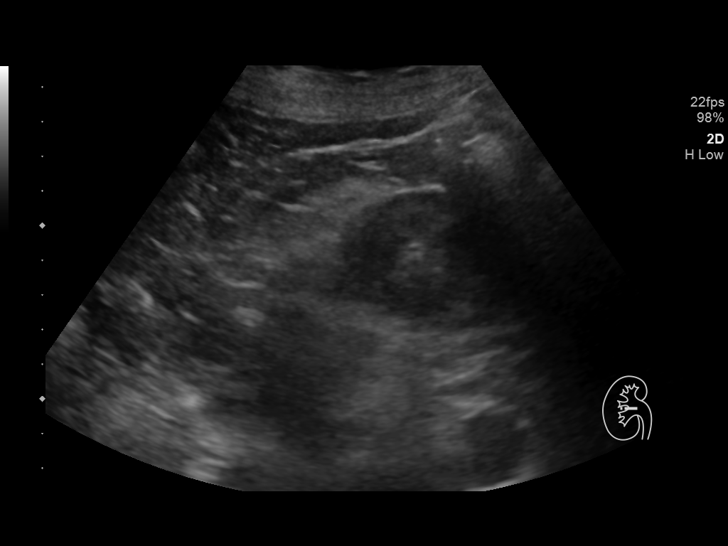
[im 17/42]
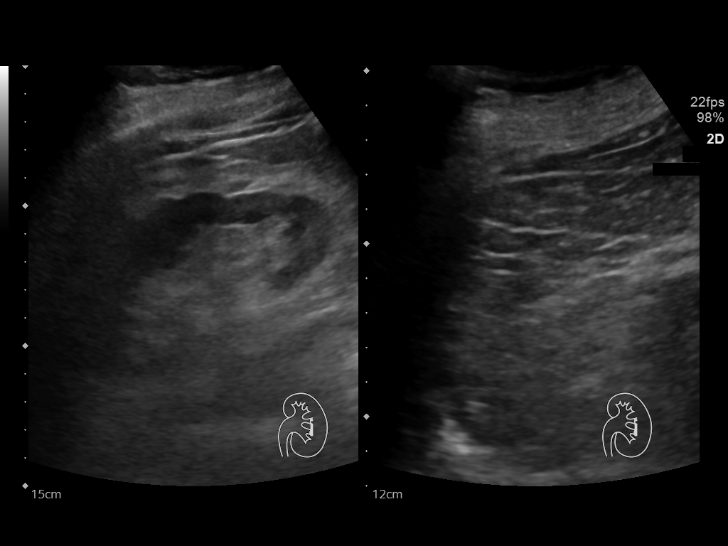
[im 20/42]
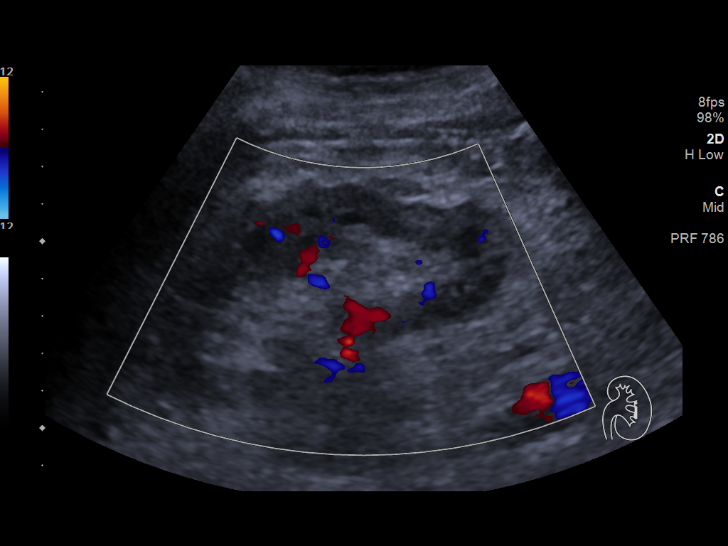
[im 24/42]
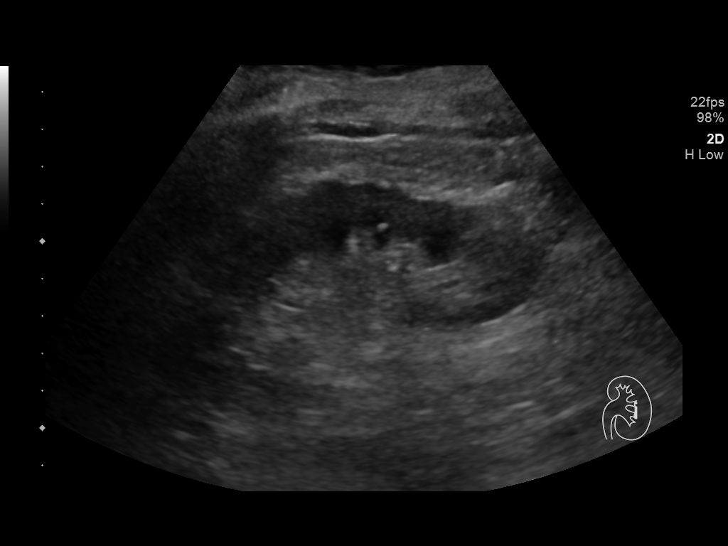
[im 27/42]
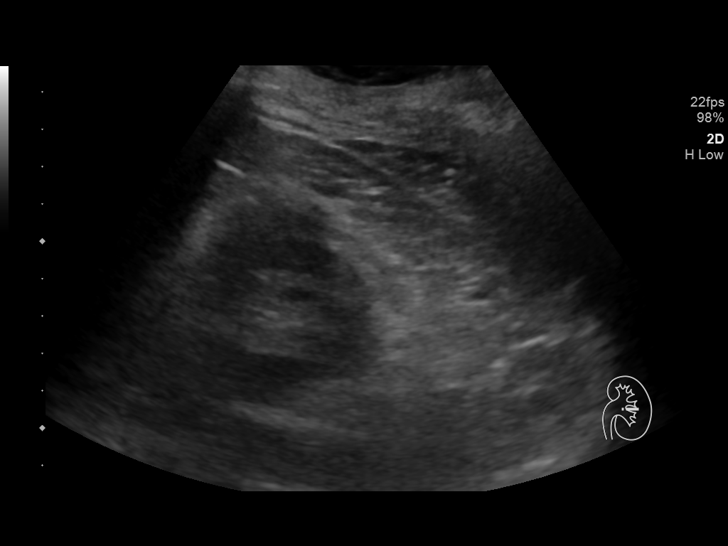
[im 29/42]
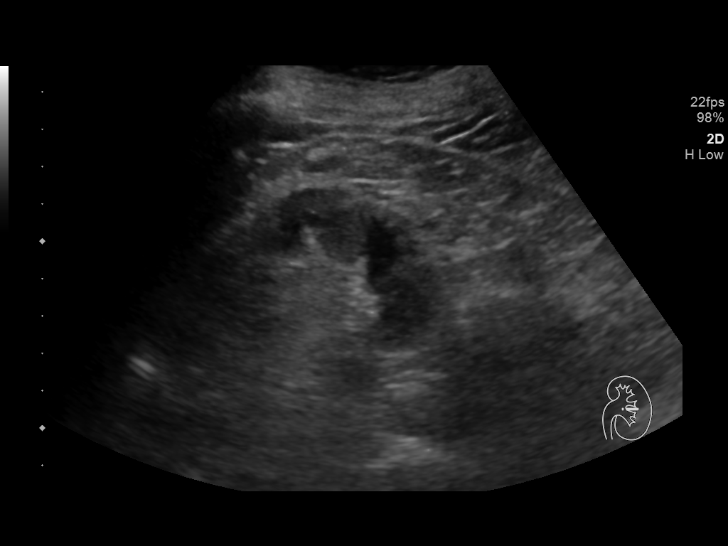
[im 33/42]
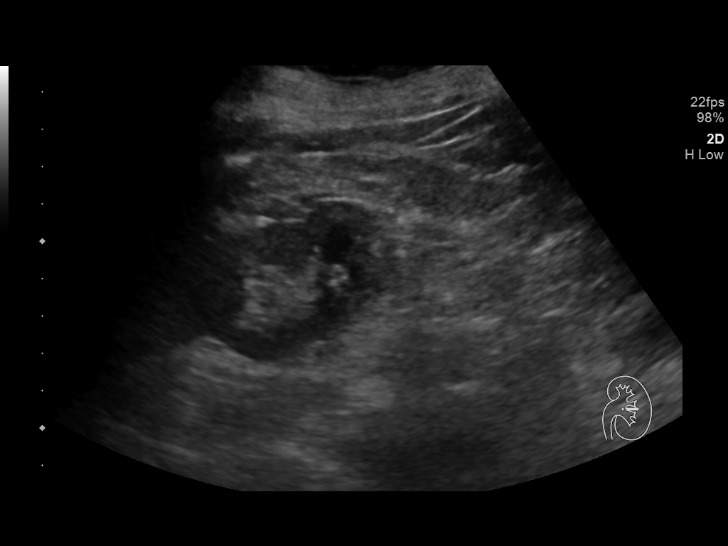
[im 36/42]
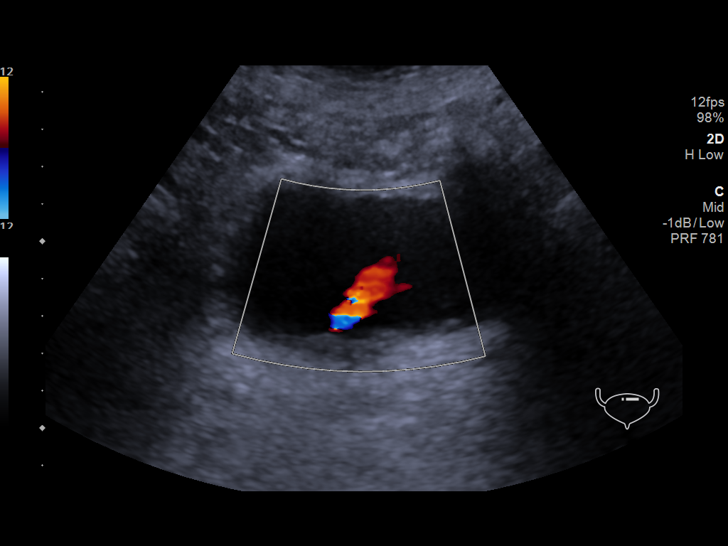
[im 40/42]
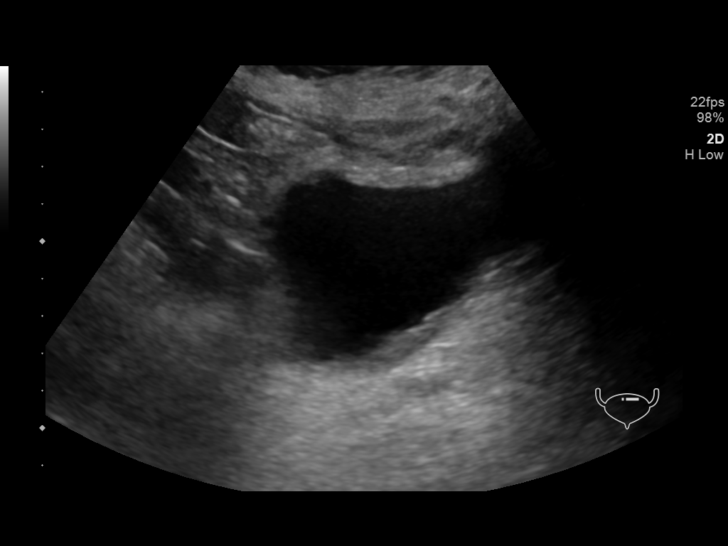

[Series 3: us renal · 1 of 2 slices shown (2 of 2)]
[im 1/2]
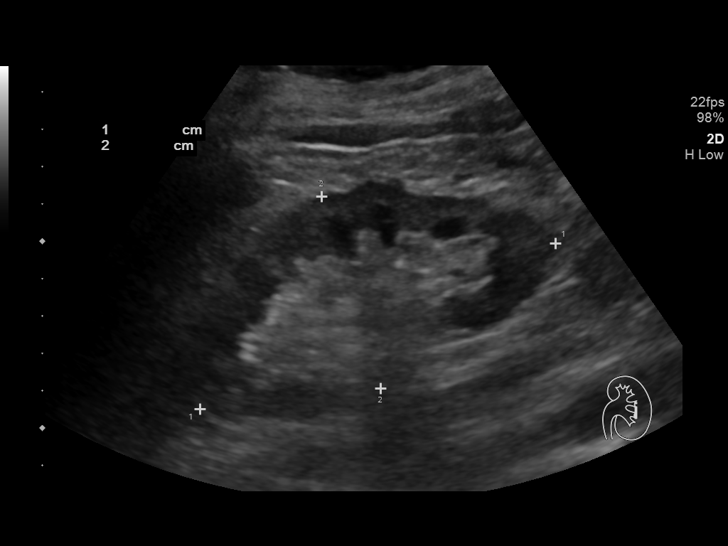

[14 of 25 positions shown; findings below may reference images not displayed]

FINDINGS: Right Kidney:

Renal measurements: 9.7 x 5.1 x 5.8 cm = volume: 147.7 mL. Increased
renal cortical echotexture consistent with medical renal disease.
Corticomedullary differentiation is preserved. No hydronephrosis or
mass.

Left Kidney:

Renal measurements: 10.5 x 5.4 x 4.3 cm = volume: 127.5 mL. Mild
increased renal cortical echotexture consistent with medical renal
disease. Corticomedullary differentiation is preserved. No
hydronephrosis or mass.

Bladder:

Appears normal for degree of bladder distention.

Other:

None.
IMPRESSION: 1. Increased renal cortical echotexture consistent with medical
renal disease.

## 2021-09-25 MED ORDER — IPRATROPIUM-ALBUTEROL 0.5-2.5 (3) MG/3ML IN SOLN
3.0000 mL | Freq: Once | RESPIRATORY_TRACT | Status: AC
  Administered 2021-09-25: 3 mL via RESPIRATORY_TRACT
  Filled 2021-09-25: qty 3

## 2021-09-25 MED ORDER — LEVALBUTEROL HCL 0.63 MG/3ML IN NEBU
0.6300 mg | INHALATION_SOLUTION | Freq: Three times a day (TID) | RESPIRATORY_TRACT | Status: DC
  Administered 2021-09-26: 0.63 mg via RESPIRATORY_TRACT
  Filled 2021-09-25: qty 3

## 2021-09-25 MED ORDER — FOLIC ACID 5 MG/ML IJ SOLN: 1.0000 mg | Freq: Every day | INTRAMUSCULAR | Status: DC

## 2021-09-25 MED ORDER — FUROSEMIDE 10 MG/ML IJ SOLN: 40.0000 mg | Freq: Two times a day (BID) | INTRAMUSCULAR | Status: DC

## 2021-09-25 MED ORDER — CHLORHEXIDINE GLUCONATE CLOTH 2 % EX PADS
6.0000 | MEDICATED_PAD | Freq: Every day | CUTANEOUS | Status: DC
  Administered 2021-09-25 – 2021-09-27 (×3): 6 via TOPICAL

## 2021-09-25 MED ORDER — METHYLPREDNISOLONE SODIUM SUCC 125 MG IJ SOLR: 0.5000 mg/kg | Freq: Two times a day (BID) | INTRAMUSCULAR | Status: DC

## 2021-09-25 MED ORDER — IPRATROPIUM-ALBUTEROL 0.5-2.5 (3) MG/3ML IN SOLN: 3.0000 mL | Freq: Once | RESPIRATORY_TRACT | Status: DC

## 2021-09-25 MED ORDER — VITAMIN C 500 MG PO TABS
500.0000 mg | ORAL_TABLET | Freq: Every day | ORAL | Status: DC
  Administered 2021-09-25 – 2021-09-28 (×4): 500 mg via ORAL
  Filled 2021-09-25 (×4): qty 1

## 2021-09-25 MED ORDER — ONDANSETRON HCL 4 MG/2ML IJ SOLN: 4.0000 mg | Freq: Four times a day (QID) | INTRAMUSCULAR | Status: DC | PRN

## 2021-09-25 MED ORDER — ACETAMINOPHEN 650 MG RE SUPP: 650.0000 mg | Freq: Four times a day (QID) | RECTAL | Status: DC | PRN

## 2021-09-25 MED ORDER — GUAIFENESIN ER 600 MG PO TB12
600.0000 mg | ORAL_TABLET | Freq: Two times a day (BID) | ORAL | Status: DC
  Administered 2021-09-25 – 2021-09-28 (×6): 600 mg via ORAL
  Filled 2021-09-25 (×6): qty 1

## 2021-09-25 MED ORDER — OXYCODONE HCL 5 MG PO TABS: 5.0000 mg | ORAL_TABLET | ORAL | Status: DC | PRN

## 2021-09-25 MED ORDER — ARFORMOTEROL TARTRATE 15 MCG/2ML IN NEBU
15.0000 ug | INHALATION_SOLUTION | Freq: Two times a day (BID) | RESPIRATORY_TRACT | Status: DC
  Administered 2021-09-25 – 2021-09-28 (×5): 15 ug via RESPIRATORY_TRACT
  Filled 2021-09-25 (×6): qty 2

## 2021-09-25 MED ORDER — METHYLPREDNISOLONE SODIUM SUCC 125 MG IJ SOLR
90.0000 mg | Freq: Two times a day (BID) | INTRAMUSCULAR | Status: AC
  Administered 2021-09-25 – 2021-09-28 (×6): 90 mg via INTRAVENOUS
  Filled 2021-09-25 (×6): qty 2

## 2021-09-25 MED ORDER — PREDNISONE 5 MG PO TABS: 50.0000 mg | ORAL_TABLET | Freq: Every day | ORAL | Status: DC

## 2021-09-25 MED ORDER — IPRATROPIUM BROMIDE 0.02 % IN SOLN
0.5000 mg | Freq: Three times a day (TID) | RESPIRATORY_TRACT | Status: DC
  Administered 2021-09-26: 0.5 mg via RESPIRATORY_TRACT
  Filled 2021-09-25: qty 2.5

## 2021-09-25 MED ORDER — THIAMINE MONONITRATE 100 MG PO TABS
100.0000 mg | ORAL_TABLET | Freq: Every day | ORAL | Status: DC
Start: 2021-09-25 — End: 2021-09-28
  Administered 2021-09-26 – 2021-09-28 (×3): 100 mg via ORAL
  Filled 2021-09-25 (×3): qty 1

## 2021-09-25 MED ORDER — INSULIN ASPART 100 UNIT/ML IJ SOLN
0.0000 [IU] | INTRAMUSCULAR | Status: DC
  Administered 2021-09-25: 5 [IU] via SUBCUTANEOUS
  Administered 2021-09-25: 3 [IU] via SUBCUTANEOUS
  Administered 2021-09-26: 2 [IU] via SUBCUTANEOUS
  Administered 2021-09-26: 5 [IU] via SUBCUTANEOUS
  Administered 2021-09-26: 11 [IU] via SUBCUTANEOUS
  Administered 2021-09-26: 5 [IU] via SUBCUTANEOUS
  Administered 2021-09-26: 2 [IU] via SUBCUTANEOUS
  Administered 2021-09-26: 3 [IU] via SUBCUTANEOUS
  Administered 2021-09-27: 2 [IU] via SUBCUTANEOUS
  Administered 2021-09-27: 3 [IU] via SUBCUTANEOUS
  Administered 2021-09-27: 5 [IU] via SUBCUTANEOUS
  Administered 2021-09-27: 3 [IU] via SUBCUTANEOUS
  Administered 2021-09-27: 5 [IU] via SUBCUTANEOUS
  Administered 2021-09-28 (×2): 2 [IU] via SUBCUTANEOUS
  Administered 2021-09-28: 3 [IU] via SUBCUTANEOUS

## 2021-09-25 MED ORDER — CICLESONIDE 160 MCG/ACT IN AERS
2.0000 | INHALATION_SPRAY | Freq: Two times a day (BID) | RESPIRATORY_TRACT | Status: DC
Start: 2021-09-25 — End: 2021-09-25

## 2021-09-25 MED ORDER — PANTOPRAZOLE SODIUM 40 MG PO TBEC
40.0000 mg | DELAYED_RELEASE_TABLET | Freq: Every day | ORAL | Status: DC
  Administered 2021-09-25 – 2021-09-28 (×4): 40 mg via ORAL
  Filled 2021-09-25 (×4): qty 1

## 2021-09-25 MED ORDER — BUDESONIDE 0.5 MG/2ML IN SUSP
1.0000 mg | Freq: Two times a day (BID) | RESPIRATORY_TRACT | Status: DC
  Administered 2021-09-25 – 2021-09-28 (×5): 1 mg via RESPIRATORY_TRACT
  Filled 2021-09-25 (×6): qty 4

## 2021-09-25 MED ORDER — METOPROLOL TARTRATE 5 MG/5ML IV SOLN
5.0000 mg | Freq: Once | INTRAVENOUS | Status: AC
Start: 2021-09-25 — End: 2021-09-25
  Administered 2021-09-25: 5 mg via INTRAVENOUS
  Filled 2021-09-25: qty 5

## 2021-09-25 MED ORDER — SODIUM CHLORIDE 0.9 % IV SOLN
1.0000 mg | Freq: Every day | INTRAVENOUS | Status: DC
  Administered 2021-09-25: 1 mg via INTRAVENOUS
  Filled 2021-09-25 (×2): qty 0.2

## 2021-09-25 MED ORDER — ONDANSETRON HCL 4 MG PO TABS: 4.0000 mg | ORAL_TABLET | Freq: Four times a day (QID) | ORAL | Status: DC | PRN

## 2021-09-25 MED ORDER — PREDNISONE 50 MG PO TABS: 50.0000 mg | ORAL_TABLET | Freq: Every day | ORAL | Status: DC

## 2021-09-25 MED ORDER — ALBUTEROL SULFATE (2.5 MG/3ML) 0.083% IN NEBU
2.5000 mg | INHALATION_SOLUTION | RESPIRATORY_TRACT | Status: DC | PRN
Start: 2021-09-25 — End: 2021-09-28
  Filled 2021-09-25: qty 3

## 2021-09-25 MED ORDER — FOLIC ACID 5 MG/ML IJ SOLN
1.0000 mg | Freq: Every day | INTRAMUSCULAR | Status: DC
  Administered 2021-09-26: 1 mg via INTRAVENOUS
  Filled 2021-09-25 (×2): qty 0.2

## 2021-09-25 MED ORDER — SODIUM CHLORIDE 0.9 % IV SOLN: 200.0000 mg | Freq: Once | INTRAVENOUS | Status: DC

## 2021-09-25 MED ORDER — ACETAMINOPHEN 500 MG PO TABS
1000.0000 mg | ORAL_TABLET | Freq: Three times a day (TID) | ORAL | Status: DC | PRN
Start: 2021-09-25 — End: 2021-09-28

## 2021-09-25 MED ORDER — FUROSEMIDE 10 MG/ML IJ SOLN
40.0000 mg | Freq: Two times a day (BID) | INTRAMUSCULAR | Status: DC
  Administered 2021-09-25 – 2021-09-26 (×2): 40 mg via INTRAVENOUS
  Filled 2021-09-25 (×2): qty 4

## 2021-09-25 MED ORDER — LATANOPROST 0.005 % OP SOLN
1.0000 [drp] | Freq: Every day | OPHTHALMIC | Status: DC
  Administered 2021-09-26 – 2021-09-27 (×2): 1 [drp] via OPHTHALMIC
  Filled 2021-09-25 (×2): qty 2.5

## 2021-09-25 MED ORDER — FLUTICASONE PROPIONATE 50 MCG/ACT NA SUSP
2.0000 | Freq: Every day | NASAL | Status: DC
  Administered 2021-09-26 – 2021-09-28 (×3): 2 via NASAL
  Filled 2021-09-25: qty 16

## 2021-09-25 MED ORDER — ZINC SULFATE 220 (50 ZN) MG PO CAPS
220.0000 mg | ORAL_CAPSULE | Freq: Every day | ORAL | Status: DC
  Administered 2021-09-25 – 2021-09-26 (×2): 220 mg via ORAL
  Filled 2021-09-25 (×2): qty 1

## 2021-09-25 MED ORDER — APIXABAN 2.5 MG PO TABS
2.5000 mg | ORAL_TABLET | Freq: Two times a day (BID) | ORAL | Status: DC
  Administered 2021-09-25 – 2021-09-28 (×6): 2.5 mg via ORAL
  Filled 2021-09-25 (×6): qty 1

## 2021-09-25 MED ORDER — METOPROLOL TARTRATE 25 MG PO TABS
37.5000 mg | ORAL_TABLET | Freq: Two times a day (BID) | ORAL | Status: DC
Start: 2021-09-25 — End: 2021-09-26
  Administered 2021-09-25 – 2021-09-26 (×2): 37.5 mg via ORAL
  Filled 2021-09-25 (×2): qty 2

## 2021-09-25 MED ORDER — LEVALBUTEROL HCL 0.63 MG/3ML IN NEBU
0.6300 mg | INHALATION_SOLUTION | Freq: Four times a day (QID) | RESPIRATORY_TRACT | Status: DC
Start: 2021-09-25 — End: 2021-09-25
  Administered 2021-09-25: 0.63 mg via RESPIRATORY_TRACT
  Filled 2021-09-25: qty 3

## 2021-09-25 MED ORDER — GUAIFENESIN-DM 100-10 MG/5ML PO SYRP: 10.0000 mL | ORAL_SOLUTION | ORAL | Status: DC | PRN

## 2021-09-25 MED ORDER — FUROSEMIDE 10 MG/ML IJ SOLN
40.0000 mg | Freq: Once | INTRAMUSCULAR | Status: AC
Start: 2021-09-25 — End: 2021-09-25
  Administered 2021-09-25: 40 mg via INTRAVENOUS
  Filled 2021-09-25: qty 4

## 2021-09-25 MED ORDER — DAPAGLIFLOZIN PROPANEDIOL 10 MG PO TABS
10.0000 mg | ORAL_TABLET | Freq: Every day | ORAL | Status: DC
  Administered 2021-09-25 – 2021-09-28 (×4): 10 mg via ORAL
  Filled 2021-09-25 (×4): qty 1

## 2021-09-25 MED ORDER — DOCUSATE SODIUM 100 MG PO CAPS
100.0000 mg | ORAL_CAPSULE | Freq: Two times a day (BID) | ORAL | Status: DC | PRN
  Administered 2021-09-27: 100 mg via ORAL

## 2021-09-25 MED ORDER — IPRATROPIUM-ALBUTEROL 20-100 MCG/ACT IN AERS
1.0000 | INHALATION_SPRAY | Freq: Four times a day (QID) | RESPIRATORY_TRACT | Status: DC
  Filled 2021-09-25: qty 4

## 2021-09-25 MED ORDER — ALBUTEROL SULFATE HFA 108 (90 BASE) MCG/ACT IN AERS: 2.0000 | INHALATION_SPRAY | RESPIRATORY_TRACT | Status: DC | PRN

## 2021-09-25 MED ORDER — HYDROCOD POLI-CHLORPHE POLI ER 10-8 MG/5ML PO SUER: 5.0000 mL | Freq: Two times a day (BID) | ORAL | Status: DC | PRN

## 2021-09-25 MED ORDER — HYDRALAZINE HCL 20 MG/ML IJ SOLN
10.0000 mg | Freq: Four times a day (QID) | INTRAMUSCULAR | Status: DC | PRN
  Filled 2021-09-25: qty 1

## 2021-09-25 MED ORDER — IPRATROPIUM-ALBUTEROL 20-100 MCG/ACT IN AERS: 1.0000 | INHALATION_SPRAY | Freq: Four times a day (QID) | RESPIRATORY_TRACT | Status: DC

## 2021-09-25 MED ORDER — POLYETHYLENE GLYCOL 3350 17 G PO PACK: 17.0000 g | PACK | Freq: Every day | ORAL | Status: DC | PRN

## 2021-09-25 MED ORDER — SODIUM CHLORIDE 0.9 % IV SOLN: 100.0000 mg | Freq: Every day | INTRAVENOUS | Status: DC

## 2021-09-25 MED ORDER — UMECLIDINIUM BROMIDE 62.5 MCG/ACT IN AEPB
1.0000 | INHALATION_SPRAY | Freq: Every day | RESPIRATORY_TRACT | Status: DC
  Administered 2021-09-26 – 2021-09-28 (×3): 1 via RESPIRATORY_TRACT
  Filled 2021-09-25: qty 7

## 2021-09-25 MED ORDER — IPRATROPIUM BROMIDE 0.02 % IN SOLN
0.5000 mg | Freq: Four times a day (QID) | RESPIRATORY_TRACT | Status: DC
  Administered 2021-09-25: 0.5 mg via RESPIRATORY_TRACT
  Filled 2021-09-25: qty 2.5

## 2021-09-25 MED ORDER — ACETAMINOPHEN 325 MG PO TABS: 650.0000 mg | ORAL_TABLET | Freq: Four times a day (QID) | ORAL | Status: DC | PRN

## 2021-09-25 MED ORDER — FUROSEMIDE 10 MG/ML IJ SOLN
40.0000 mg | Freq: Once | INTRAMUSCULAR | Status: AC
  Administered 2021-09-25: 40 mg via INTRAVENOUS
  Filled 2021-09-25: qty 4

## 2021-09-25 NOTE — Consult Note (Signed)
CARDIOLOGY CONSULT NOTE  Patient ID: Jordan Hoffman MRN: [id number] DOB/AGE: [id number] y.o.  Admit date: 09/25/2021 Attending physician: Laurin Coder, MD Primary Physician:  [facility] [place] Outpatient Cardiologist:  [facility] Inpatient Cardiologist: Rex Kras, DO, [facility]  Reason of consultation: CHF Referring physician: Dr. Gareth Morgan  Chief complaint: Shortness of breath and altered mental status  HPI:  Jordan Hoffman is a 88 y.o. African-American male who presents with a chief complaint of " shortness of breath and altered mental status." His past medical history and cardiovascular risk factors include: Persistent atrial fibrillation (last DCCV 12/16/2015), HFrEF, COPD, history of syncope, nonobstructive CAD, type 2 diabetes, hypertension, OSA on CPAP.  Patient is currently on BiPAP and therefore not able to provide an accurate history of present illness.  However his daughter [facility]) who was present at bedside provides the HPI.  Patient was recently hospitalized at [facility] in August 2023 and underwent cholecystectomy and hernia repair.  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  He was discharged to [place] facility for further rehab needs.  Overall he was doing well however over the last 24 to 48 hours patient started developing shortness of breath.  Due to the progressive symptoms EMS was called and he was noted to be hypoxic satting 88-83% on room air, telemetry noted A-fib with RVR.  During the hospitalization he is noted to be COVID-positive, BNP elevated, chest x-ray noting vascular congestion and therefore cardiology was consulted for CHF management.    Reviewed his electronic medical records via [place] thoroughly and it appears that  the patient has had very extensive cardiovascular evaluation at [facility] in [place].  Cardiac MRI back in October 2022 noted LVEF of 29%, was treated for HFrEF but uptitration of GDMT therapy has been difficult due to either side effect or lab abnormalities.  Documented LVEF in January 2022 was >55% without any significant valvular heart disease.  During his recent hospitalization in August 2023 echocardiogram was performed as part of stroke work-up and LVEF was reported to be 30-35% with severe global hypokinesis.  Based on the records available his last ischemic work-up was in August 2017 and he was noted to have nonobstructive CAD.  Patient denies anginal discomfort currently in days leading up to this hospitalization.  He does have shortness of breath and requiring 2 pillows at night and dyspnea on exertion .  He denies PND or lower extremity swelling.    ALLERGIES: Allergies  Allergen Reactions   Lisinopril Cough        Penicillins Other (See Comments)    Did it involve swelling of the face/tongue/throat, SOB, or low BP? Yes Did it involve sudden or severe rash/hives, skin peeling, or any reaction on the inside of your mouth or nose? No Did you need to seek medical attention at a hospital or doctor's office? No When did it last happen? Unk  If all above answers are "NO", may proceed with cephalosporin use.   *pt tolerated ancef on 08/08/16    Pravastatin Hives        Sulfa Antibiotics Itching and Other (See Comments)    Itching, watery eyes   Atorvastatin Other (See Comments)    Muscle pain    Codeine Hives        Flunisolide Hives  epistaxis Other reaction(s): Bleeding from nose, Other (See Comments) Hives  Hives     Loratadine     Hives  Other reaction(s): Other (See Comments), Other (See Comments) Hives  Hives  Hives     Niacin And Related     Hives    Niaspan [Niacin] Hives and Other (See Comments)    Flushing, also   Simvastatin Hives     weakness Other reaction(s): Weakness present   Statins Hives    weakness Other reaction(s): Other (See Comments), Unknown Hives  weakness Hives  Hives  Hives     Ciprofloxacin Rash    Hives  Other reaction(s): Other (See Comments), Other (See Comments) Hives  Hives  Hives     Erythromycin Rash    Hives  Other reaction(s): Other (See Comments), Other (See Comments) Hives  Hives  Hives     Ranitidine Rash    Hives  Other reaction(s): Other (See Comments), Other (See Comments) Hives  Hives  Hives      PAST MEDICAL HISTORY: Past Medical History:  Diagnosis Date   Atrial fibrillation ([place])    Back pain    COPD (chronic obstructive pulmonary disease) ([place])    Diabetes mellitus without complication (HCC)    High cholesterol    Hypertension    Knee pain     PAST SURGICAL HISTORY: Past Surgical History:  Procedure Laterality Date   BACK SURGERY     HIP SURGERY     JOINT REPLACEMENT Left    left hip replacement   MOUTH SURGERY     REPLACEMENT TOTAL KNEE Right    TRANSURETHRAL RESECTION OF PROSTATE      FAMILY HISTORY: The patient's family history includes Kidney disease in his brother; Lung cancer in his brother, father, and mother; Stroke in his sister.   SOCIAL HISTORY:  The patient  reports that he has quit smoking. He has never used smokeless tobacco. He reports that he does not drink alcohol and does not use drugs.  MEDICATIONS: Current Outpatient Medications  Medication Instructions   acetaminophen (TYLENOL) 1,000 mg, Oral, 3 times daily PRN   albuterol (PROVENTIL) 2.5 mg, Nebulization, Every 4 hours PRN   albuterol (VENTOLIN HFA) 108 (90 Base) MCG/ACT inhaler 2 puffs, Inhalation, Every 4 hours PRN   ALVESCO 160 MCG/ACT inhaler 2 puffs, Inhalation, 2 times daily   apixaban (ELIQUIS) 2.5 mg, Oral, 2 times daily   carboxymethylcellulose (REFRESH PLUS) 0.5 % SOLN 1 drop, Both Eyes, 4 times daily   cholecalciferol (VITAMIN D3) 1,000 Units, Oral, See  admin instructions, 1 by mouth two times a week on Saturday and Sunday for supplement.   COVID-19 mRNA bivalent vaccine, Pfizer, (PFIZER COVID-19 VAC BIVALENT) injection Intramuscular   COVID-19 mRNA bivalent vaccine, Pfizer, (PFIZER COVID-19 VAC BIVALENT) injection Intramuscular   diclofenac Sodium (VOLTAREN) 2 g, Topical, 4 times daily PRN   doxycycline (VIBRAMYCIN) 100 mg, Oral, 2 times daily   fluticasone (FLONASE) 50 MCG/ACT nasal spray 2 sprays, Each Nare, Daily   gabapentin (NEURONTIN) 300 mg, Oral, 3 times daily   guaiFENesin (MUCINEX) 600 mg, Oral, 2 times daily   guaiFENesin-dextromethorphan (ROBITUSSIN DM) 100-10 MG/5ML syrup 10 mLs, Oral, Every 4 hours PRN   latanoprost (XALATAN) 0.005 % ophthalmic solution 1 drop, Both Eyes, Daily at bedtime   loratadine (CLARITIN) 10 mg, Oral, Daily   Metoprolol Tartrate 37.5 MG TABS 1 tablet, Oral, 2 times daily   omeprazole (PRILOSEC) 20 mg, Oral, Daily   OXYGEN 2 L, Inhalation, Daily  Oyster Shell 500 mg, Oral, 2 times daily   PSYLLIUM HUSK PO 1 capsule, Oral, Daily   sodium chloride (OCEAN) 0.65 % SOLN nasal spray 2 sprays, Each Nare, 2 times daily   STIOLTO RESPIMAT 2.5-2.5 MCG/ACT AERS 2 puffs, Inhalation, Daily    REVIEW OF SYSTEMS: Limited as he is currently on BiPAP, nods yes or no to questions. Review of Systems  Cardiovascular:  Positive for dyspnea on exertion and orthopnea. Negative for chest pain, claudication, irregular heartbeat, leg swelling, near-syncope, palpitations, paroxysmal nocturnal dyspnea and syncope.  Respiratory:  Positive for shortness of breath.   Hematologic/Lymphatic: Negative for bleeding problem.  Musculoskeletal:  Negative for muscle cramps and myalgias.  Neurological:  Negative for dizziness and light-headedness.   PHYSICAL EXAM:    09/25/2021    3:30 PM 09/25/2021    3:00 PM 09/25/2021    2:45 PM  Vitals with BMI  Systolic 098 119 147  Diastolic 95 829 562  Pulse 64 101 83    No intake or  output data in the 24 hours ending 09/25/21 1630  Net IO Since Admission: No IO data has been entered for this period [09/25/21 1630]  CONSTITUTIONAL: Appears older than stated age, hemodynamically stable, no acute distress, on BiPAP support  SKIN: Skin is warm and dry. No rash noted. No cyanosis. No pallor. No jaundice HEAD: Normocephalic and atraumatic.  EYES: No scleral icterus, arcus senilis MOUTH/THROAT: Dry oral membranes.  NECK: JVD present. No thyromegaly noted. No carotid bruits  CHEST Normal respiratory effort. No intercostal retractions  LUNGS: Decreased breath sounds bilaterally.  No rales rhonchi's or wheezes CARDIOVASCULAR: Irregularly irregular, tachycardic, variable S1-S2, no murmurs rubs or gallops appreciated secondary to tachycardia.   ABDOMINAL: Soft, nontender, nondistended, positive bowel sounds in all 4 quadrants, no apparent ascites.  EXTREMITIES: No pitting edema, warm to touch.  HEMATOLOGIC: No significant bruising NEUROLOGIC: Oriented to person, place, and time.  Right-sided weakness. Normal muscle tone.  PSYCHIATRIC: Normal mood and affect. Normal behavior. Cooperative  RADIOLOGY: CT Head Wo Contrast  Result Date: 09/25/2021 CLINICAL DATA:  Altered mental status.  Nontraumatic. EXAM: CT HEAD WITHOUT CONTRAST TECHNIQUE: Contiguous axial images were obtained from the base of the skull through the vertex without intravenous contrast. RADIATION DOSE REDUCTION: This exam was performed according to the departmental dose-optimization program which includes automated exposure control, adjustment of the mA and/or kV according to patient size and/or use of iterative reconstruction technique. COMPARISON:  08/30/2021 FINDINGS: Brain: No evidence of acute infarction, hemorrhage, hydrocephalus, extra-axial collection or mass lesion/mass effect.Encephalomalacia within the left parietal subcortical white matter and cortex compatible with recent posterior left MCA territory infarct,  image 22/3. Remote left basal ganglia lacunar infarct noted. There is mild diffuse low-attenuation within the subcortical and periventricular white matter compatible with chronic microvascular disease. Prominence of the sulci and ventricles compatible with brain atrophy. Vascular: No hyperdense vessel or unexpected calcification. Skull: Normal. Negative for fracture or focal lesion. Sinuses/Orbits: Chronic mucoperiosteal thickening involving the left maxillary sinus noted. Mastoid air cells are clear. Other: None. IMPRESSION: 1. No acute intracranial abnormalities. 2. Encephalomalacia within the left parietal subcortical white matter and cortex compatible with recent posterior left MCA territory infarct. 3. Chronic small vessel ischemic disease and brain atrophy. Electronically Signed   By: Kerby Moors M.D.   On: 09/25/2021 10:42   DG Chest 1 View  Result Date: 09/25/2021 CLINICAL DATA:  Altered mental status, short of breath EXAM: CHEST  1 VIEW COMPARISON:  Prior chest x-ray 08/26/2021  FINDINGS: Similar degree of marked cardiomegaly. Interval increase in diffuse pulmonary vascular congestion and interstitial airspace opacities with Kerley B-lines peripherally. Findings are consistent with interstitial pulmonary edema. No large pneumothorax or pleural effusion. No acute osseous abnormality. IMPRESSION: Pulmonary edema and cardiomegaly consistent with CHF. Electronically Signed   By: Jacqulynn Cadet M.D.   On: 09/25/2021 08:00    LABORATORY DATA: Lab Results  Component Value Date   WBC 6.6 09/25/2021   HGB 13.6 09/25/2021   HCT 40.0 09/25/2021   MCV 90.2 09/25/2021   PLT 256 09/25/2021    Recent Labs  Lab 09/25/21 0745 09/25/21 1018  NA 141 140  K 4.4 4.2  CL 106  --   CO2 25  --   BUN 19  --   CREATININE 1.33*  --   CALCIUM 10.2  --   PROT 7.5  --   BILITOT 1.8*  --   ALKPHOS 121  --   ALT 13  --   AST 22  --   GLUCOSE 178*  --     Lipid Panel  No results found for: "CHOL",  "HDL", "LDLCALC", "LDLDIRECT", "TRIG", "CHOLHDL"  Care Everywhere 08/29/2021: Total cholesterol 56, triglycerides 82, HDL 20, LDL calculated 20, non-HDL 36   BNP (last 3 results) Recent Labs    09/25/21 0745  BNP 1,146.4*    HEMOGLOBIN A1C Lab Results  Component Value Date   HGBA1C 6.3 (H) 07/29/2020   MPG 134.11 07/29/2020   Care everywhere 08/29/2021: A1c 6.9  Cardiac Panel (last 3 results) Recent Labs    09/25/21 0745 09/25/21 1227 09/25/21 1449  TROPONINIHS 65* 75* 84*     TSH No results for input(s): "TSH" in the last 8760 hours.   RADIOLOGY: CARE EVERYWHERE Chest x-ray 08/26/2021: Mild to moderate pulmonary edema with trace effusions and bibasilar atelectasis. Unchanged cardiomegaly.  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 No evidence of new infarction or hemorrhage. No significant change compared to prior same day examination.  Carotid duplex 08/30/2021 [facility]: Right Findings No significant atherosclerosis or stenosis of the right common carotid artery. The Doppler flow velocities within the right external carotid artery are within normal limits. 1-39% stenosis of the right internal carotid artery/bifurcation. Plaque is calcified . Antegrade flow is noted in the right vertebral artery.  Left Findings No significant atherosclerosis or stenosis of the left common carotid artery. The Doppler flow velocities within the left external carotid artery are within normal limits. 1-39% stenosis of the left internal carotid artery. Plaque is calcified . Antegrade flow is noted in the left vertebral artery.  MRI head without contrast 08/29/2021: Motion degraded.  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  Chronic microvascular ischemic changes.  No proximal intracranial vessel occlusion.  CARDIAC  DATABASE: PER CARE EVERYWHERE:  Echo 08/28/2021 Per report: LVEF 30-35%, severe global hypokinesis, mild LAE, moderate RAE, normal right ventricular size and function, moderate MR, mild to moderate TR, IVC severely dilated, no pericardial effusion  PYP study 05/06/2021: H/CL ratio of 1.3. Planar Grade 1. SPECT Grade 2. Findings are overall equivocal for TTR amyloidosis.   ECHO: 01/19/2020 Interpretation Summary Compared to prior study, there is no significant change.  Normal LV size. Moderate LVH. LVEF>55%. Normal RV size and function. Mild RA enlargement. Mild TR. Mild MR.The estimated RVSP= 32mmHg Trivial PR. No AR. Borderline aortic root enlargement. No pericardial effusion.  Cardiac MRI  11/09/2020: 1. Normal-sized left ventricle with severe left ventricular systolic dysfunction with a calculated ejection fraction of 29%.   2. Mild left ventricular hypertrophy with areas of mid myocardial delayed enhancement as described above which could be related to sequelae of prior myocarditis or sarcoidosis.   3. Mild to moderately dilated right ventricle with normal right ventricular systolic function.   4. Mildly dilated left atrium and moderately dilated right atrium.   5. Mild tricuspid regurgitation.  Cardiac Cath (08/18/15) 1. Diffuse dense calcific plaque ascending aorta without aneurysmal changes  or dissection  2. Right dominant coronary system with non-obstructive 40% proximal LAD,  otherwise normal coronaries   Exercise Stress Test (07/2015) 1. Positive study (1.1mm) during Stage II Bruce protocol. No chronotropic  incompetence. 2. Normal heart rate and BP response to exercise. Of note, in early recovery,  pt developed narrow complex tachycardia consistent with SVT (16 beats, HR  170s) that was asymptomatic. 3. Exercise terminated due to fatigue. 4. Study done to assess for chronotropic incompetence.  CURRENT HOSPITALIZATION: EKG: 09/25/2021: Atrial fibrillation, 110 bpm,  LVH per voltage criteria, ST-T changes likely secondary to repolarization abnormality but lateral ischemia cannot be ruled out, rare PVCs  Echocardiogram: Pending  IMPRESSION & RECOMMENDATIONS: Jordan Hoffman is a 88 y.o. African-American male whose past medical history and cardiovascular risk factors include: Persistent atrial fibrillation (last DCCV 12/16/2015), HFrEF, COPD, history of syncope, nonobstructive CAD, type 2 diabetes, hypertension, OSA on CPAP.Marland Kitchen  Impression:  Acute hypoxic respiratory failure -multifactorial (acute COVID-19 infection, underlying COPD, acute HFrEF, cardiomyopathy, A-fib with RVR) Acute HFrEF, stage C, NYHA class III Elevated high sensitive troponins likely secondary to supply demand ischemia Cardiomyopathy, etiology unknown Persistent atrial fibrillation Nonobstructive CAD. Non-insulin-dependent diabetes mellitus type 2. Hypertension. Hyperlipidemia/statin intolerance OSA on CPAP.   Plan:  Acute HFrEF, stage C, NYHA class III Patient has had a very thorough work-up with the [facility] for Care Everywhere.  Extensive review of prior work-up summarized above for reference. Based on prior documentation from his other cardiology providers up titration of GDMT has been difficult due to history of syncope, renal insufficiency, hypotension, etc. However, given the acute exacerbation we will focus on diuresis. IV Lasix 40 mg IV push twice daily. Start Farxiga 10 mg p.o. daily. We will uptitrate GDMT as tolerated keeping in mind the prior attempts made by his outpatient cardiology. Strict I's and O's. Daily weights. Continue cardiac telemetry. Wean BiPAP as tolerated.  Elevated troponins: Likely secondary to supply demand ischemia due to acute hypoxic respiratory failure, Afib RVR, cardiomyopathy, HFrEF No significant rise or fall in troponins to suggest ACS. No recent chest pain. Given his COVID-19 infection we will avoid ischemic work-up in the acute  setting.  However, I have spoke to his daughter Durenda Guthrie that it should be considered as outpatient as per the patient's goals of care. Continue telemetry We will continue to monitor.  Persistent atrial fibrillation: Based on the records from care everywhere he has undergone cardioversion multiple times in the past and several antiarrhythmic medications have also been attempted. We will focus on rate control strategy for now. Continue thromboembolic prophylaxis with Eliquis. CHA2DS2-VASc SCORE is 7 which correlates to 9.6% risk of stroke per year (CHF, HTN, age greater than 76, DM, stroke).  Has followed up with electrophysiology in the past as well.  Nonobstructive CAD: Based on electronic medical records last ischemic evaluation was in 2017. Has documented intolerance to statin therapy in the past.  LDL based on labs from  August 2023 within acceptable range. Currently on oral anticoagulation and therefore aspirin held to minimize risk of bleeding. As mentioned above consider outpatient ischemic work-up after resolution of COVID-19 as per the patient's and family goals of care.  This was conveyed to his daughter [facility] during today's encounter.  COVID-19 infection: Management per primary and critical care medicine  Acute hypoxic respiratory failure Multifactorial (acute COVID-19 infection, underlying COPD, acute HFrEF, cardiomyopathy, A-fib with RVR) CHF management as noted above. COVID management per primary team. A-fib with RVR-rate control management  Total encounter time 85 minutes. Case discussed with ER physician, daughter [place].  Extensive review of electronic medical records including prior echocardiograms, cardiac MRI, PYP results, heart catheterization results, labs, radiological studies including CT/MRI of the brain. Independent review of today's EKG, labs, discussing plan/goals of care with her daughter.  Patient's questions and concerns were addressed to his satisfaction. He  voices understanding of the instructions provided during this encounter.   This note was created using a voice recognition software as a result there may be grammatical errors inadvertently enclosed that do not reflect the nature of this encounter. Every attempt is made to correct such errors.  Mechele Claude [facility]  Pager: [phone] Office: [phone] 09/25/2021, 4:30 PM

## 2021-09-25 NOTE — ED Triage Notes (Signed)
Patient arrives with [facility] from [facility] after family called EMS c/o pt having AMS, and "acting like himself" for the past 2 days. Pt was also SHOB and placed on 10L NRB by EMS d/t SpO2 of 80-83% on room air. Pt has hx of afib, CVA with right sided weakness, cholecystectomy last month. Per EMS, pt's HR was 150, then decreased to 100-120 bpm after given 1L of LR. Pt also given 10 mg albuterol, 2 g Mag, 0.5 atrovent, and 125 mg of solu-medrol en route to ED by EMS. Per EMS, pt is a&o to self, place, and situation, but also "would say things that don't make sense." Per family, pt may also have UTI.   EMS vitals: 170/100 CBG 236 HR 100-120 RR 30

## 2021-09-25 NOTE — Progress Notes (Signed)
RT removed pt from BiPAP and placed pt on 3L [place]. BiPAP is on stby in room, with PRN order. RT will continue to monitor pt.

## 2021-09-25 NOTE — Progress Notes (Signed)
Pt in no distress requiring bipap at this time.  RT will cont to monitor. 

## 2021-09-25 NOTE — ED Provider Notes (Addendum)
[facility] EMERGENCY DEPARTMENT Provider Note   CSN: [id number] Arrival date & time: 09/25/21  2440     History  Chief Complaint  Patient presents with   AMS/SHOB    Jordan Hoffman is a 88 y.o. male.  HPI     88 year old male with a history of atrial fibrillation on Eliquis, COPD, diabetes, hypertension, hyperlipidemia who presents with concern for shortness of breath from [place] [facility].  Reports he has been feeling short of breath for the last 2 days, and has had significant cough.  Denies any fever, chest pain, nausea, vomiting, diarrhea.  Reports he had some leg swelling last week but feels it is improved now.  Family members report he has not been acting like himself for the past 2 days.  He was found to be hypoxic with an oxygen saturation of 80 to 83% on room air and was placed on nonrebreather.  His heart rate was initially found to be 150 then decreased after he received 1 L fluid with EMS.  He was also given 10 mg albuterol, 2 g of magnesium, 0.5 of Atrovent, 125 mg Solu-Medrol.  Per EMS he was alert and oriented to self, place and situation but would sometimes say things that did not make sense.  Wife has been staying with him at rehab facility.  Past Medical History:  Diagnosis Date   Atrial fibrillation (HCC)    Back pain    COPD (chronic obstructive pulmonary disease) (HCC)    Diabetes mellitus without complication (HCC)    High cholesterol    Hypertension    Knee pain      Home Medications Prior to Admission medications   Medication Sig Start Date End Date Taking? Authorizing Provider  acetaminophen (TYLENOL) 325 MG tablet Take 975 mg by mouth 2 (two) times daily.    [provider]  albuterol (PROVENTIL) (2.5 MG/3ML) 0.083% nebulizer solution Take 2.5 mg by nebulization every 6 (six) hours as needed for wheezing or shortness of breath.    [provider]  albuterol (VENTOLIN HFA) 108 (90 Base) MCG/ACT inhaler Inhale 2 puffs  into the lungs every 6 (six) hours as needed for wheezing or shortness of breath.    [provider]  amiodarone (PACERONE) 200 MG tablet Take 200 mg by mouth daily. 02/11/20   [provider]  amLODipine (NORVASC) 10 MG tablet Take 10 mg by mouth daily.    [provider]  apixaban (ELIQUIS) 2.5 MG TABS tablet Take 1 tablet (2.5 mg total) by mouth 2 (two) times daily. 07/31/20   [name], MD  Calcium Carbonate-Vitamin D3 600-400 MG-UNIT TABS Take 1 tablet by mouth 2 (two) times daily.    [provider]  CALCIUM PO Take 1 tablet by mouth daily.    [provider]  carboxymethylcellulose (REFRESH PLUS) 0.5 % SOLN Place 1 drop into both eyes 4 (four) times daily. 08/04/19   [provider]  cholecalciferol (VITAMIN D) 25 MCG (1000 UNIT) tablet Take 1,000 Units by mouth daily.    [provider]  COVID-19 mRNA bivalent vaccine, Pfizer, (PFIZER COVID-19 VAC BIVALENT) injection Inject into the muscle. 10/28/20   [name], MD  COVID-19 mRNA bivalent vaccine, Pfizer, (PFIZER COVID-19 VAC BIVALENT) injection Inject into the muscle. 07/13/21   [name], MD  diphenhydrAMINE (BENADRYL) 25 MG tablet Take 25 mg by mouth every 6 (six) hours as needed for sleep.    [provider]  doxycycline (VIBRAMYCIN) 100 MG capsule  Take 1 capsule (100 mg total) by mouth 2 (two) times daily. 12/21/20   Isla Pence, MD  fluticasone (FLONASE) 50 MCG/ACT nasal spray Place 2 sprays into both nostrils daily as needed for allergies or rhinitis.    [provider]  Fluticasone-Salmeterol (ADVAIR) 250-50 MCG/DOSE AEPB Inhale 1 puff into the lungs 2 (two) times daily.    [provider]  furosemide (LASIX) 40 MG tablet Take 20 mg by mouth daily at 6 (six) AM. 01/06/20   [provider]  gabapentin (NEURONTIN) 300 MG capsule Take 300 mg by mouth 2 (two) times daily.    [provider]  guaiFENesin (MUCINEX) 600  MG 12 hr tablet Take 600 mg by mouth 2 (two) times daily.    [provider]  guaiFENesin-dextromethorphan (ROBITUSSIN DM) 100-10 MG/5ML syrup Take 10 mLs by mouth every 4 (four) hours as needed for cough. 07/31/20   [name], MD  latanoprost (XALATAN) 0.005 % ophthalmic solution Place 1 drop into both eyes at bedtime.    [provider]  loratadine (CLARITIN) 10 MG tablet Take 10 mg by mouth daily.    [provider]  Menthol-Methyl Salicylate (THERA-GESIC EX) Apply 1 application topically every 6 (six) hours as needed (pain AVOID FACE AND EYES - [place] HANDS AFTER USING).    [provider]  omeprazole (PRILOSEC) 20 MG capsule Take 20 mg by mouth daily.    [provider]  psyllium (METAMUCIL) 58.6 % powder Take 1 packet by mouth daily.    [provider]  Skin Protectants, Misc. (HYDROCERIN EX) Apply 1 application topically daily as needed (dry skin).    [provider]  sodium chloride (OCEAN) 0.65 % SOLN nasal spray Place 2 sprays into both nostrils 2 (two) times daily as needed for congestion.    [provider]  terbinafine (LAMISIL) 1 % cream Apply 1 application topically 2 (two) times daily as needed (between toes).    [provider]  Tiotropium Bromide Monohydrate 2.5 MCG/ACT AERS Inhale 2 puffs into the lungs daily.    [provider]  urea (CARMOL) 20 % cream Apply 1 application topically 2 (two) times daily as needed (dry skin on feet DO NOT USE BETWEEN TOES).    [provider]      Allergies    Lisinopril, Penicillins, Pravastatin, Sulfa antibiotics, Atorvastatin, Codeine, Flunisolide, Loratadine, Niacin and related, Niaspan [niacin], Simvastatin, Statins, Ciprofloxacin, Erythromycin, and Ranitidine    Review of Systems   Review of Systems  Physical Exam Updated Vital Signs BP (!) 169/95   Pulse (!) 116   Temp 98.3 F (36.8 C) (Oral)   Resp (!) 27   Ht 6\' 4"  (1.93 m)   Wt  87 kg   SpO2 97%   BMI 23.35 kg/m  Physical Exam  ED Results / Procedures / Treatments   Labs (all labs ordered are listed, but only abnormal results are displayed) Labs Reviewed  CBC WITH DIFFERENTIAL/PLATELET - Abnormal; Notable for the following components:      Result Value   Hemoglobin 12.4 (*)    All other components within normal limits  COMPREHENSIVE METABOLIC PANEL - Abnormal; Notable for the following components:   Glucose, Bld 178 (*)    Creatinine, Ser 1.33 (*)    Albumin 3.2 (*)    Total Bilirubin 1.8 (*)    GFR, Estimated 50 (*)    All other components within normal limits  BRAIN NATRIURETIC PEPTIDE - Abnormal; Notable for the following  components:   B Natriuretic Peptide 1,146.4 (*)    All other components within normal limits  TROPONIN I (HIGH SENSITIVITY) - Abnormal; Notable for the following components:   Troponin I (High Sensitivity) 65 (*)    All other components within normal limits  SARS CORONAVIRUS 2 BY RT PCR  MAGNESIUM  LACTIC ACID, PLASMA  LACTIC ACID, PLASMA  URINALYSIS, ROUTINE W REFLEX MICROSCOPIC  TROPONIN I (HIGH SENSITIVITY)    EKG EKG Interpretation  Date/Time:  Sunday September 25 2021 08:12:45 EDT Ventricular Rate:  110 PR Interval:    QRS Duration: 93 QT Interval:  354 QTC Calculation: 479 R Axis:   -66 Text Interpretation: Atrial fibrillation Left anterior fascicular block LVH with secondary repolarization abnormality Probable anterior infarct, age indeterminate No significant change since last tracing Confirmed by Gareth Morgan [phone]) on 09/25/2021 9:25:43 AM  Radiology DG Chest 1 View  Result Date: 09/25/2021 CLINICAL DATA:  Altered mental status, short of breath EXAM: CHEST  1 VIEW COMPARISON:  Prior chest x-ray 08/26/2021 FINDINGS: Similar degree of marked cardiomegaly. Interval increase in diffuse pulmonary vascular congestion and interstitial airspace opacities with Kerley B-lines peripherally. Findings are consistent  with interstitial pulmonary edema. No large pneumothorax or pleural effusion. No acute osseous abnormality. IMPRESSION: Pulmonary edema and cardiomegaly consistent with CHF. Electronically Signed   By: Jacqulynn Cadet M.D.   On: 09/25/2021 08:00    Procedures Procedures    Medications Ordered in ED Medications  furosemide (LASIX) injection 40 mg (has no administration in time range)  ipratropium-albuterol (DUONEB) 0.5-2.5 (3) MG/3ML nebulizer solution 3 mL (3 mLs Nebulization Given 09/25/21 0810)    ED Course/ Medical Decision Making/ A&P                           Medical Decision Making Amount and/or Complexity of Data Reviewed Labs: ordered. Radiology: ordered.  Risk Prescription drug management. Decision regarding hospitalization.   88 year old male with a history of atrial fibrillation on Eliquis, EF 30-35% on recent ECHO 8/12, COPD, diabetes, hypertension, hyperlipidemia who presents with concern for shortness of breath, found to be hypoxic with EMS.   Differential diagnosis for dyspnea includes ACS, PE, COPD exacerbation, CHF exacerbation, anemia, pneumonia, viral etiology such as COVID 19 infection, metabolic abnormality.  Chest x-ray was done and personally interpreted by me and radiology which showed pulmonary edema. EKG was evaluated by me which showed atrial fibrillation.  BNP was 1146.  Lower suspicion for PE given he is on Eliquis, and has findings to represent congestive heart failure. Denies chest pain, troponin more elevated in comparison to prior however clinically feel it is secondary to stress.  Other abs completed and personally evaluated interpreted by me which showed a normal white blood cell count, no significant anemia, creatinine improved from prior.  Ordered lasix, also given duonebs for COPD/wheezing in additoin to the solumedrol/Mg given by EMS.  Suspect his confusion at home may have been secondary to hypoxia.  We will admit to the hospital for CHF  exacerbation.  Is tachypneic on my reevaluation, afib rate increased. Ordered BiPAP to assist with work of breathing in setting of CHF/COPD.  CT head and UA also ordered to evaluate for other causes of altered mental status. Overall, feel encephalopathy secondary to hypoxia, but given he is on anticoagulation will evaluate for signs of ICH and will evaluate for UTI with UA given family concerns.  Will consult Cardiology per hospitalist recommendation and admit for further care.  Final Clinical Impression(s) / ED Diagnoses Final diagnoses:  Acute systolic congestive heart failure ([place])  Acute respiratory failure with hypoxia [facility])    Rx / DC Orders ED Discharge Orders     None         Gareth Morgan, MD 09/25/21 7416    Gareth Morgan, MD 09/25/21 1013

## 2021-09-25 NOTE — H&P (Signed)
NAME:  Jordan Hoffman, MRN:  [id number], DOB:  [birth year], LOS: 0 ADMISSION DATE:  09/25/2021, CONSULTATION DATE:  9/10 REFERRING MD:  Dr. Alfredia Ferguson / [facility] , CHIEF COMPLAINT:  SOB   History of Present Illness:  88 y/o M who presented to [facility] on 9/10 with reports of increasing shortness of breath.   Patient was recently underwent a cholecystectomy on August 12 at [facility].  He was discharged to skilled nursing facility at [facility].  In addition to recent cholecystectomy he had a CVA with residual right-sided weakness for which he was making progress at the rehab.  The patient was pending discharge on Friday, September 15th but unfortunately contracted COVID.  Family noted while at skilled nursing facility over the last 48 hours he had not been acting like himself.  He unfortunately had also not been sleeping at night.  The patient developed shortness of breath.  EMS was activated on a.m. 9/10 for evaluation and found him to have saturations of 80 to 83% on room air, A-fib with RVR.  He was treated with 1 L of LR, 10 mg albuterol, Atrovent, magnesium, and 125 of Solu-Medrol in route.  On arrival to ER, the patient was initially on 4 L nasal cannula and was ultimately placed on BiPAP with 40% FiO2.  ABG evaluation notable for pH 7.37, PCO2 38, PO2 74, bicarbonate 22. COVID screening positive.  Initial labs-NA 141, K4.4, CL 106, CO2 25, glucose 178, BUN 19, creatinine 1.33, calcium 10.2, magnesium 2.4, albumin 3.2, BNP 1146, troponin 65, LA 2.7, WBC 6.6, Hgb 12.4 and platelets 256.  Chest x-ray notable for cardiomegaly, vascular congestion. He was initially evaluated by [facility] for admission, given lasix and breathing treatments.  However, given BiPAP needs, PCCM consulted for evaluation of respiratory failure.    Pertinent  Medical History  AF  CVA-residual mild right-sided weakness COPD  DM  HLD  HTN Knee Pain s/p R Total Knee  Left Hip Replacement  TURP  Significant Hospital  Events: Including procedures, antibiotic start and stop dates in addition to other pertinent events   9/10 Admit with SOB, AMS, COVID +, decompensated CHF   Interim History / Subjective:  As above  Objective   Blood pressure (!) 175/101, pulse 82, temperature 98.3 F (36.8 C), temperature source Oral, resp. rate 18, height _0  (1.93 m), weight 87 kg, SpO2 98 %.    FiO2 (%):  [40 %] 40 %  No intake or output data in the 24 hours ending 09/25/21 1259 Filed Weights   09/25/21 0827  Weight: 87 kg    Examination: General: well preserved elderly adult male lying in bed in NAD, daughter at bedside HENT: MM pink/dry, BiPAP mask in place, pupils =/reactive  Lungs: non-labored at rest, lungs bilaterally diminished but clear Cardiovascular: s1s2 irr irr, AF on monitor Abdomen: non-distended, soft, bsx4 active  Extremities: warm/dry, no edema  Neuro: Asleep, awakens to voice, MAE, slight weakness on right    Resolved Hospital Problem list     Assessment & Plan:   Acute Hypoxemic Respiratory Failure  COVID Infection  Hx COPD Suspect multifactorial in setting of possible CHF with pulmonary edema, decompensated CHF and COVID (though more likely incidental finding) -admit to ICU overnight  -solumedrol 0.5 mg/kg IV then transition to oral prednisone  -assess ESR, lactic acid -O2 as needed to support saturations >90% -BiPAP PRN for increased WOB, QHS  -nebulized bronchodilators -mucinex PRN  -flutter valve, IS  Suspected Decompensated CHF No  ECHO found in system / Care Everywhere -assess ECHO -lasix BID  -monitor I/O's  -assess EKG -pending work up, may need Cardiology evaluation   HTN  HLD  -continue lopressor   Permanent AF  On eliquis  -continue home eliquis  -tele monitoring   Acute Metabolic Encephalopathy  Hx CVA  Suspect driven by lack of sleep, hypoxia -exam reassuring, wakes / interactive  -minimize all sedating medications  -PT efforts when able, residual  mild R sided weakness   Best Practice (right click and "Reselect all SmartList Selections" daily)  Diet/type: clear liquids DVT prophylaxis: DOAC GI prophylaxis: PPI Lines: N/A Foley:  N/A Code Status:  full code Last date of multidisciplinary goals of care discussion: 9/10 full code    [place] will plan for pick up in am 9/11.  Pending respiratory review.   Labs   CBC: Recent Labs  Lab 09/25/21 0745 09/25/21 1018  WBC 6.6  --   NEUTROABS 5.0  --   HGB 12.4* 13.6  HCT 39.5 40.0  MCV 90.2  --   PLT 256  --     Basic Metabolic Panel: Recent Labs  Lab 09/25/21 0745 09/25/21 1018  NA 141 140  K 4.4 4.2  CL 106  --   CO2 25  --   GLUCOSE 178*  --   BUN 19  --   CREATININE 1.33*  --   CALCIUM 10.2  --   MG 2.4  --    GFR: Estimated Creatinine Clearance: 44.4 mL/min (A) (by C-G formula based on SCr of 1.33 mg/dL (H)). Recent Labs  Lab 09/25/21 0745  WBC 6.6  LATICACIDVEN 2.7*    Liver Function Tests: Recent Labs  Lab 09/25/21 0745  AST 22  ALT 13  ALKPHOS 121  BILITOT 1.8*  PROT 7.5  ALBUMIN 3.2*   No results for input(s): "LIPASE", "AMYLASE" in the last 168 hours. No results for input(s): "AMMONIA" in the last 168 hours.  ABG    Component Value Date/Time   PHART 7.370 09/25/2021 1018   PCO2ART 38.3 09/25/2021 1018   PO2ART 74 (L) 09/25/2021 1018   HCO3 22.2 09/25/2021 1018   TCO2 23 09/25/2021 1018   ACIDBASEDEF 3.0 (H) 09/25/2021 1018   O2SAT 95 09/25/2021 1018     Coagulation Profile: No results for input(s): "INR", "PROTIME" in the last 168 hours.  Cardiac Enzymes: No results for input(s): "CKTOTAL", "CKMB", "CKMBINDEX", "TROPONINI" in the last 168 hours.  HbA1C: Hgb A1c MFr Bld  Date/Time Value Ref Range Status  07/29/2020 04:54 AM 6.3 (H) 4.8 - 5.6 % Final    Comment:    (NOTE) Pre diabetes:          5.7%-6.4%  Diabetes:              >6.4%  Glycemic control for   <7.0% adults with diabetes   04/09/2020 04:17 AM 6.4 (H) 4.8 -  5.6 % Final    Comment:    (NOTE) Pre diabetes:          5.7%-6.4%  Diabetes:              >6.4%  Glycemic control for   <7.0% adults with diabetes     CBG: No results for input(s): "GLUCAP" in the last 168 hours.  Review of Systems:   Information obtained from daughter at bedside, prior medical documentation, and staff.   Past Medical History:  He,  has a past medical history of Atrial fibrillation ([place]), Back pain,  COPD (chronic obstructive pulmonary disease) ([place]), Diabetes mellitus without complication ([place]), High cholesterol, Hypertension, and Knee pain.   Surgical History:   Past Surgical History:  Procedure Laterality Date   BACK SURGERY     HIP SURGERY     JOINT REPLACEMENT Left    left hip replacement   MOUTH SURGERY     REPLACEMENT TOTAL KNEE Right    TRANSURETHRAL RESECTION OF PROSTATE       Social History:   reports that he has quit smoking. He has never used smokeless tobacco. He reports that he does not drink alcohol and does not use drugs.   Family History:  His family history includes Kidney disease in his brother; Lung cancer in his brother, father, and mother; Stroke in his sister. There is no history of Heart attack.   Allergies Allergies  Allergen Reactions   Lisinopril Cough        Penicillins Other (See Comments)    Did it involve swelling of the face/tongue/throat, SOB, or low BP? Yes Did it involve sudden or severe rash/hives, skin peeling, or any reaction on the inside of your mouth or nose? No Did you need to seek medical attention at a hospital or doctor's office? No When did it last happen? Unk  If all above answers are "NO", may proceed with cephalosporin use.   *pt tolerated ancef on 08/08/16    Pravastatin Hives        Sulfa Antibiotics Itching and Other (See Comments)    Itching, watery eyes   Atorvastatin Other (See Comments)    Muscle pain    Codeine Hives        Flunisolide Hives    epistaxis Other reaction(s):  Bleeding from nose, Other (See Comments) Hives  Hives     Loratadine     Hives  Other reaction(s): Other (See Comments), Other (See Comments) Hives  Hives  Hives     Niacin And Related     Hives    Niaspan [Niacin] Hives and Other (See Comments)    Flushing, also   Simvastatin Hives    weakness Other reaction(s): Weakness present   Statins Hives    weakness Other reaction(s): Other (See Comments), Unknown Hives  weakness Hives  Hives  Hives     Ciprofloxacin Rash    Hives  Other reaction(s): Other (See Comments), Other (See Comments) Hives  Hives  Hives     Erythromycin Rash    Hives  Other reaction(s): Other (See Comments), Other (See Comments) Hives  Hives  Hives     Ranitidine Rash    Hives  Other reaction(s): Other (See Comments), Other (See Comments) Hives  Hives  Hives       Home Medications  Prior to Admission medications   Medication Sig Start Date End Date Taking? Authorizing Provider  acetaminophen (TYLENOL) 500 MG tablet Take 1,000 mg by mouth 3 (three) times daily as needed for mild pain or moderate pain.   Yes [provider]  albuterol (PROVENTIL) (2.5 MG/3ML) 0.083% nebulizer solution Take 2.5 mg by nebulization every 4 (four) hours as needed for wheezing or shortness of breath.   Yes [provider]  albuterol (VENTOLIN HFA) 108 (90 Base) MCG/ACT inhaler Inhale 2 puffs into the lungs every 4 (four) hours as needed for wheezing or shortness of breath.   Yes [provider]  ALVESCO 160 MCG/ACT inhaler Inhale 2 puffs into the lungs 2 (two) times daily. 09/07/21  Yes [provider]  apixaban (  ELIQUIS) 2.5 MG TABS tablet Take 1 tablet (2.5 mg total) by mouth 2 (two) times daily. 07/31/20  Yes [name], MD  carboxymethylcellulose (REFRESH PLUS) 0.5 % SOLN Place 1 drop into both eyes 4 (four) times daily. 08/04/19  Yes [provider]  cholecalciferol (VITAMIN D) 25 MCG (1000 UNIT) tablet Take 1,000  Units by mouth See admin instructions. 1 by mouth two times a week on Saturday and Sunday for supplement.   Yes [provider]  diclofenac Sodium (VOLTAREN) 1 % GEL Apply 2 g topically 4 (four) times daily as needed (shoulder pain).   Yes [provider]  fluticasone (FLONASE) 50 MCG/ACT nasal spray Place 2 sprays into both nostrils daily.   Yes [provider]  gabapentin (NEURONTIN) 300 MG capsule Take 300 mg by mouth 3 (three) times daily.   Yes [provider]  guaiFENesin (MUCINEX) 600 MG 12 hr tablet Take 600 mg by mouth 2 (two) times daily.   Yes [provider]  latanoprost (XALATAN) 0.005 % ophthalmic solution Place 1 drop into both eyes at bedtime.   Yes [provider]  loratadine (CLARITIN) 10 MG tablet Take 10 mg by mouth daily.   Yes [provider]  Metoprolol Tartrate 37.5 MG TABS Take 1 tablet by mouth 2 (two) times daily. 09/06/21  Yes [provider]  omeprazole (PRILOSEC) 20 MG capsule Take 20 mg by mouth daily.   Yes [provider]  OXYGEN Inhale 2 L into the lungs daily.   Yes [provider]  Oyster Shell 500 MG TABS Take 500 mg by mouth 2 (two) times daily.   Yes [provider]  PSYLLIUM HUSK PO Take 1 capsule by mouth daily.   Yes [provider]  sodium chloride (OCEAN) 0.65 % SOLN nasal spray Place 2 sprays into both nostrils 2 (two) times daily.   Yes [provider]  STIOLTO RESPIMAT 2.5-2.5 MCG/ACT AERS Inhale 2 puffs into the lungs daily. 09/06/21  Yes [provider]  COVID-19 mRNA bivalent vaccine, Pfizer, (PFIZER COVID-19 VAC BIVALENT) injection Inject into the muscle. 10/28/20   [name], MD  COVID-19 mRNA bivalent vaccine, Pfizer, (PFIZER COVID-19 VAC BIVALENT) injection Inject into the muscle. 07/13/21   [name], MD  doxycycline (VIBRAMYCIN) 100 MG capsule Take 1 capsule (100 mg total) by mouth 2 (two) times daily. Patient  not taking: Reported on 09/25/2021 12/21/20   Isla Pence, MD  guaiFENesin-dextromethorphan [facility] DM) 100-10 MG/5ML syrup Take 10 mLs by mouth every 4 (four) hours as needed for cough. Patient not taking: Reported on 09/25/2021 07/31/20   [name], MD     Critical care time: 33 minutes    Noe Gens, MSN, APRN, NP-C, AGACNP-BC [facility] 09/25/2021, 1:00 PM   Please see [email or web address] for pager details.   From 7A-7P if no response, please call [phone] After hours, please call ELink [phone]

## 2021-09-25 NOTE — H&P (Signed)
History and Physical    Patient: Jordan Hoffman AYT:[id number] DOB: September 05, 1928 DOA: 09/25/2021 DOS: the patient was seen and examined on 09/25/2021 PCP: [facility]  Patient coming from: SNF  Chief Complaint:  Chief Complaint  Patient presents with   AMS/SHOB   HPI: BRADD MERLOS is a 88 y.o. male with medical history significant of but not limited to chronic atrial fibrillation on anticoagulation, back pain, COPD, diabetes mellitus type 2, hypertension, hyperlipidemia, history of knee pain status post total knee replacement, history of recent left hip replacement and TURP and other comorbidities who presented to the ED with altered mental status and worsening shortness of breath.  Of note the patient underwent a recent cholecystectomy back in August 27, 2021 at [facility] and was discharged to a SNF at any point hospital.  In addition to his recent cholecystectomy had a recent CVA with residual right-sided weakness for which he is making progress at the rehab.  Patient was possibly discharged this upcoming Friday unfortunately contracted COVID at the facility.  While at the facility he was noted to be altered and confused and per family report had not been acting like himself.  He had not been sleeping and then developed progressively worsening shortness of breath.  EMS was called on the morning of 09/25/2021 and they found him to have saturations in the 80s and 90s and he was in A-fib with RVR.  He was given 1 L of LR, albuterol and Atrovent as well as magnesium and also given 125 mg of Solu-Medrol en route.  While in EMS he was placed on nonrebreather  On arrival to the emergency room he initially required 4 L supplemental oxygen which then subsequently had to be placed on BiPAP.  ABG was done and he tested positive for COVID as well.  Chest x-ray showed significant cardiomegaly and vascular congestion and given IV Lasix and breathing treatments however given that he continued to have  BiPAP needs and became more somnolent on the BiPAP PCCM was consulted for his respiratory failure.  He has been transferred to the ICU at this time and if stable will be transferred back to the [facility] service  Review of Systems: unable to review all systems due to the inability of the patient to answer questions. Past Medical History:  Diagnosis Date   Atrial fibrillation (HCC)    Back pain    COPD (chronic obstructive pulmonary disease) (HCC)    Diabetes mellitus without complication ([place])    High cholesterol    Hypertension    Knee pain    Past Surgical History:  Procedure Laterality Date   BACK SURGERY     HIP SURGERY     JOINT REPLACEMENT Left    left hip replacement   MOUTH SURGERY     REPLACEMENT TOTAL KNEE Right    TRANSURETHRAL RESECTION OF PROSTATE     Social History:  reports that he has quit smoking. He has never used smokeless tobacco. He reports that he does not drink alcohol and does not use drugs.  Allergies  Allergen Reactions   Lisinopril Cough        Penicillins Other (See Comments)    Did it involve swelling of the face/tongue/throat, SOB, or low BP? Yes Did it involve sudden or severe rash/hives, skin peeling, or any reaction on the inside of your mouth or nose? No Did you need to seek medical attention at a hospital or doctor's office? No When did it last happen?  Unk  If all above answers are "NO", may proceed with cephalosporin use.   *pt tolerated ancef on 08/08/16    Pravastatin Hives        Sulfa Antibiotics Itching and Other (See Comments)    Itching, watery eyes   Atorvastatin Other (See Comments)    Muscle pain    Codeine Hives        Flunisolide Hives    epistaxis Other reaction(s): Bleeding from nose, Other (See Comments) Hives  Hives     Loratadine     Hives  Other reaction(s): Other (See Comments), Other (See Comments) Hives  Hives  Hives     Niacin And Related     Hives    Niaspan [Niacin] Hives and Other (See Comments)     Flushing, also   Simvastatin Hives    weakness Other reaction(s): Weakness present   Statins Hives    weakness Other reaction(s): Other (See Comments), Unknown Hives  weakness Hives  Hives  Hives     Ciprofloxacin Rash    Hives  Other reaction(s): Other (See Comments), Other (See Comments) Hives  Hives  Hives     Erythromycin Rash    Hives  Other reaction(s): Other (See Comments), Other (See Comments) Hives  Hives  Hives     Ranitidine Rash    Hives  Other reaction(s): Other (See Comments), Other (See Comments) Hives  Hives  Hives      Family History  Problem Relation Age of Onset   Lung cancer Mother        Never smoked   Lung cancer Father    Stroke Sister    Lung cancer Brother    Kidney disease Brother    Heart attack Neg Hx     Prior to Admission medications   Medication Sig Start Date End Date Taking? Authorizing Provider  acetaminophen (TYLENOL) 500 MG tablet Take 1,000 mg by mouth 3 (three) times daily as needed for mild pain or moderate pain.   Yes [provider]  albuterol (PROVENTIL) (2.5 MG/3ML) 0.083% nebulizer solution Take 2.5 mg by nebulization every 4 (four) hours as needed for wheezing or shortness of breath.   Yes [provider]  albuterol (VENTOLIN HFA) 108 (90 Base) MCG/ACT inhaler Inhale 2 puffs into the lungs every 4 (four) hours as needed for wheezing or shortness of breath.   Yes [provider]  ALVESCO 160 MCG/ACT inhaler Inhale 2 puffs into the lungs 2 (two) times daily. 09/07/21  Yes [provider]  apixaban (ELIQUIS) 2.5 MG TABS tablet Take 1 tablet (2.5 mg total) by mouth 2 (two) times daily. 07/31/20  Yes [name], MD  carboxymethylcellulose (REFRESH PLUS) 0.5 % SOLN Place 1 drop into both eyes 4 (four) times daily. 08/04/19  Yes [provider]  cholecalciferol (VITAMIN D) 25 MCG (1000 UNIT) tablet Take 1,000 Units by mouth See admin instructions. 1 by mouth two times a week on  Saturday and Sunday for supplement.   Yes [provider]  diclofenac Sodium (VOLTAREN) 1 % GEL Apply 2 g topically 4 (four) times daily as needed (shoulder pain).   Yes [provider]  fluticasone (FLONASE) 50 MCG/ACT nasal spray Place 2 sprays into both nostrils daily.   Yes [provider]  gabapentin (NEURONTIN) 300 MG capsule Take 300 mg by mouth 3 (three) times daily.   Yes [provider]  guaiFENesin (MUCINEX) 600 MG 12 hr tablet Take 600 mg by mouth 2 (two) times daily.  Yes [provider]  latanoprost (XALATAN) 0.005 % ophthalmic solution Place 1 drop into both eyes at bedtime.   Yes [provider]  loratadine (CLARITIN) 10 MG tablet Take 10 mg by mouth daily.   Yes [provider]  Metoprolol Tartrate 37.5 MG TABS Take 1 tablet by mouth 2 (two) times daily. 09/06/21  Yes [provider]  omeprazole (PRILOSEC) 20 MG capsule Take 20 mg by mouth daily.   Yes [provider]  OXYGEN Inhale 2 L into the lungs daily.   Yes [provider]  Oyster Shell 500 MG TABS Take 500 mg by mouth 2 (two) times daily.   Yes [provider]  PSYLLIUM HUSK PO Take 1 capsule by mouth daily.   Yes [provider]  sodium chloride (OCEAN) 0.65 % SOLN nasal spray Place 2 sprays into both nostrils 2 (two) times daily.   Yes [provider]  STIOLTO RESPIMAT 2.5-2.5 MCG/ACT AERS Inhale 2 puffs into the lungs daily. 09/06/21  Yes [provider]  COVID-19 mRNA bivalent vaccine, Pfizer, (PFIZER COVID-19 VAC BIVALENT) injection Inject into the muscle. 10/28/20   [name], MD  COVID-19 mRNA bivalent vaccine, Pfizer, (PFIZER COVID-19 VAC BIVALENT) injection Inject into the muscle. 07/13/21   [name], MD  doxycycline (VIBRAMYCIN) 100 MG capsule Take 1 capsule (100 mg total) by mouth 2 (two) times daily. Patient not taking: Reported on 09/25/2021 12/21/20   Isla Pence, MD   guaiFENesin-dextromethorphan [facility] DM) 100-10 MG/5ML syrup Take 10 mLs by mouth every 4 (four) hours as needed for cough. Patient not taking: Reported on 09/25/2021 07/31/20   [name], MD   Physical Exam: Vitals:   09/25/21 0827 09/25/21 1038 09/25/21 1100 09/25/21 1223  BP:    (!) 175/101  Pulse:    82  Resp:    18  Temp:      TempSrc:      SpO2:  97% 97% 98%  Weight: 87 kg     Height: 6\' 4"  (1.93 m)      Vitals:   09/25/21 1709 09/25/21 1715  BP:  (!) 153/82  Pulse: 92 83  Resp: (!) 28 (!) 31  Temp:    SpO2: 99% 96%   Constitutional: Elderly chronically ill-appearing African-American male who is not really as responsive on BiPAP and is somnolent and confused Respiratory: Diminished to auscultation bilaterally with coarse breath sounds and crackles noted, no wheezing, rales, rhonchi.  Has slightly increased respiratory rate and effort and is on BiPAP Cardiovascular: Irregularly irregular and tachycardic, no murmurs / rubs / gallops. S1 and S2 auscultated. N 1+ extremity edema Abdomen: Soft, non-tender, non-distended.  Bowel sounds positive.  GU: Deferred. Musculoskeletal: No clubbing / cyanosis of digits/nails. No joint deformity upper and lower extremities on limited skin evaluation.  Neurologic: He is somnolent and drowsy and not awake enough to participate in neurological examination Psychiatric: Impaired judgment and insight  Data Reviewed:  Recent Results (from the past 2160 hour(s))  CBC with Differential     Status: Abnormal   Collection Time: 09/25/21  7:45 AM  Result Value Ref Range   WBC 6.6 4.0 - 10.5 K/uL   RBC 4.38 4.22 - 5.81 MIL/uL   Hemoglobin 12.4 (L) 13.0 - 17.0 g/dL   HCT 39.5 39.0 - 52.0 %   MCV 90.2 80.0 - 100.0 fL   MCH 28.3 26.0 - 34.0 pg   MCHC 31.4 30.0 - 36.0 g/dL   RDW 14.9 11.5 - 15.5 %  Platelets 256 150 - 400 K/uL   nRBC 0.0 0.0 - 0.2 %   Neutrophils Relative % 76 %   Neutro Abs 5.0 1.7 - 7.7 K/uL   Lymphocytes Relative 16  %   Lymphs Abs 1.1 0.7 - 4.0 K/uL   Monocytes Relative 6 %   Monocytes Absolute 0.4 0.1 - 1.0 K/uL   Eosinophils Relative 0 %   Eosinophils Absolute 0.0 0.0 - 0.5 K/uL   Basophils Relative 1 %   Basophils Absolute 0.1 0.0 - 0.1 K/uL   Immature Granulocytes 1 %   Abs Immature Granulocytes 0.03 0.00 - 0.07 K/uL    Comment: Performed at [facility] [address] [place], [place] [address]  Comprehensive metabolic panel     Status: Abnormal   Collection Time: 09/25/21  7:45 AM  Result Value Ref Range   Sodium 141 135 - 145 mmol/L   Potassium 4.4 3.5 - 5.1 mmol/L   Chloride 106 98 - 111 mmol/L   CO2 25 22 - 32 mmol/L   Glucose, Bld 178 (H) 70 - 99 mg/dL    Comment: Glucose reference range applies only to samples taken after fasting for at least 8 hours.   BUN 19 8 - 23 mg/dL   Creatinine, Ser 1.33 (H) 0.61 - 1.24 mg/dL   Calcium 10.2 8.9 - 10.3 mg/dL   Total Protein 7.5 6.5 - 8.1 g/dL   Albumin 3.2 (L) 3.5 - 5.0 g/dL   AST 22 15 - 41 U/L   ALT 13 0 - 44 U/L   Alkaline Phosphatase 121 38 - 126 U/L   Total Bilirubin 1.8 (H) 0.3 - 1.2 mg/dL   GFR, Estimated 50 (L) >60 mL/min    Comment: (NOTE) Calculated using the CKD-EPI Creatinine Equation (2021)    Anion gap 10 5 - 15    Comment: Performed at [facility] [address], [place], [place] [address]  Troponin I (High Sensitivity)     Status: Abnormal   Collection Time: 09/25/21  7:45 AM  Result Value Ref Range   Troponin I (High Sensitivity) 65 (H) <18 ng/L    Comment: (NOTE) Elevated high sensitivity troponin I (hsTnI) values and significant  changes across serial measurements may suggest ACS but many other  chronic and acute conditions are known to elevate hsTnI results.  Refer to the "Links" section for chest pain algorithms and additional  guidance. Performed at [facility], [place] [address] [place], [place] [address]   Lactic acid, plasma     Status: Abnormal   Collection Time: 09/25/21  7:45 AM   Result Value Ref Range   Lactic Acid, Venous 2.7 (HH) 0.5 - 1.9 mmol/L    Comment: CRITICAL RESULT CALLED TO, READ BACK BY AND VERIFIED WITH T,GLOSSON RN @1008  09/25/21 E,BENTON Performed at [facility], [place] [address], [place], [place] [address]   Brain natriuretic peptide     Status: Abnormal   Collection Time: 09/25/21  7:45 AM  Result Value Ref Range   B Natriuretic Peptide 1,146.4 (H) 0.0 - 100.0 pg/mL    Comment: Performed at [facility] [address] [place], [place] [address]  Magnesium     Status: None   Collection Time: 09/25/21  7:45 AM  Result Value Ref Range   Magnesium 2.4 1.7 - 2.4 mg/dL    Comment: Performed at [facility] [address] [place],  [address]  SARS Coronavirus 2  by RT PCR (hospital order, performed in [facility] hospital lab) *cepheid single result test* Anterior Nasal Swab     Status: Abnormal   Collection Time: 09/25/21  8:22 AM   Specimen: Anterior Nasal Swab  Result Value Ref Range   SARS Coronavirus 2 by RT PCR POSITIVE (A) NEGATIVE    Comment: (NOTE) SARS-CoV-2 target nucleic acids are DETECTED  SARS-CoV-2 RNA is generally detectable in upper respiratory specimens  during the acute phase of infection.  Positive results are indicative  of the presence of the identified virus, but do not rule out bacterial infection or co-infection with other pathogens not detected by the test.  Clinical correlation with patient history and  other diagnostic information is necessary to determine patient infection status.  The expected result is negative.  Fact Sheet for Patients:   https://www.patel.info/   Fact Sheet for Healthcare Providers:   https://hall.com/    This test is not yet approved or cleared by the [place] FDA and  has been authorized for detection and/or diagnosis of SARS-CoV-2 by FDA under an Emergency Use Authorization (EUA).  This EUA will remain in effect  (meaning this test can be used) for the duration of  the COVID-19 declaration under Section 564(b)(1)  of the Act, 21 U.S.C. section 360-bbb-3(b)(1), unless the authorization is terminated or revoked sooner.   Performed at [facility], [place] [address], [place], [place] [address]   I-Stat arterial blood gas, ED     Status: Abnormal   Collection Time: 09/25/21 10:18 AM  Result Value Ref Range   pH, Arterial 7.370 7.35 - 7.45   pCO2 arterial 38.3 32 - 48 mmHg   pO2, Arterial 74 (L) 83 - 108 mmHg   Bicarbonate 22.2 20.0 - 28.0 mmol/L   TCO2 23 22 - 32 mmol/L   O2 Saturation 95 %   Acid-base deficit 3.0 (H) 0.0 - 2.0 mmol/L   Sodium 140 135 - 145 mmol/L   Potassium 4.2 3.5 - 5.1 mmol/L   Calcium, Ion 1.36 1.15 - 1.40 mmol/L   HCT 40.0 39.0 - 52.0 %   Hemoglobin 13.6 13.0 - 17.0 g/dL   Patient temperature 98.3 F    Sample type ARTERIAL   Lactic acid, plasma     Status: Abnormal   Collection Time: 09/25/21 12:27 PM  Result Value Ref Range   Lactic Acid, Venous 2.8 (HH) 0.5 - 1.9 mmol/L    Comment: CRITICAL VALUE NOTED. VALUE IS CONSISTENT WITH PREVIOUSLY REPORTED/CALLED VALUE Performed at [facility], [place] [address], [place], [place] [address]   Troponin I (High Sensitivity)     Status: Abnormal   Collection Time: 09/25/21 12:27 PM  Result Value Ref Range   Troponin I (High Sensitivity) 75 (H) <18 ng/L    Comment: (NOTE) Elevated high sensitivity troponin I (hsTnI) values and significant  changes across serial measurements may suggest ACS but many other  chronic and acute conditions are known to elevate hsTnI results.  Refer to the "Links" section for chest pain algorithms and additional  guidance. Performed at [facility], [place] [address] [place], [place] [address]   Urinalysis, Routine w reflex microscopic Urine, Clean Catch     Status: Abnormal   Collection Time: 09/25/21 12:27 PM  Result Value Ref Range   Color, Urine YELLOW YELLOW   APPearance HAZY  (A) CLEAR   Specific Gravity, Urine 1.014 1.005 - 1.030   pH 5.0 5.0 - 8.0   Glucose,  UA NEGATIVE NEGATIVE mg/dL   Hgb urine dipstick NEGATIVE NEGATIVE   Bilirubin Urine NEGATIVE NEGATIVE   Ketones, ur NEGATIVE NEGATIVE mg/dL   Protein, ur 100 (A) NEGATIVE mg/dL   Nitrite NEGATIVE NEGATIVE   Leukocytes,Ua SMALL (A) NEGATIVE   RBC / HPF 0-5 0 - 5 RBC/hpf   WBC, UA 21-50 0 - 5 WBC/hpf   Bacteria, UA MANY (A) NONE SEEN   Squamous Epithelial / LPF 0-5 0 - 5   Mucus PRESENT     Comment: Performed at [facility], [place] [address] [place], [place] [address]  C-reactive protein     Status: Abnormal   Collection Time: 09/25/21 12:27 PM  Result Value Ref Range   CRP 4.1 (H) <1.0 mg/dL    Comment: Performed at [facility], [place] [address] [place], [place] [address]  Sedimentation rate     Status: Abnormal   Collection Time: 09/25/21 12:27 PM  Result Value Ref Range   Sed Rate 48 (H) 0 - 16 mm/hr    Comment: Performed at [facility] [address], [place], [place] [address]  Lactate dehydrogenase     Status: None   Collection Time: 09/25/21 12:27 PM  Result Value Ref Range   LDH 139 98 - 192 U/L    Comment: Performed at [facility], [place] [address] [place], [place] [address]  Fibrinogen     Status: Abnormal   Collection Time: 09/25/21 12:27 PM  Result Value Ref Range   Fibrinogen 609 (H) 210 - 475 mg/dL    Comment: (NOTE) Fibrinogen results may be underestimated in patients receiving thrombolytic therapy. Performed at [facility], [place] [address] [place], [place] [address]   Ferritin     Status: None   Collection Time: 09/25/21 12:27 PM  Result Value Ref Range   Ferritin 113 24 - 336 ng/mL    Comment: Performed at [facility], [place] [address] [place], [place] [address]  D-dimer, quantitative     Status: Abnormal   Collection Time: 09/25/21 12:27 PM  Result Value Ref Range   D-Dimer, Quant 1.00 (H) 0.00 - 0.50 ug/mL-FEU    Comment:  (NOTE) At the manufacturer cut-off value of 0.5 g/mL FEU, this assay has a negative predictive value of 95-100%.This assay is intended for use in conjunction with a clinical pretest probability (PTP) assessment model to exclude pulmonary embolism (PE) and deep venous thrombosis (DVT) in outpatients suspected of PE or DVT. Results should be correlated with clinical presentation. Performed at [facility], [place] [address] [place], [place] [address]   CBG monitoring, ED     Status: Abnormal   Collection Time: 09/25/21  2:39 PM  Result Value Ref Range   Glucose-Capillary 208 (H) 70 - 99 mg/dL    Comment: Glucose reference range applies only to samples taken after fasting for at least 8 hours.  Lactic acid, plasma     Status: Abnormal   Collection Time: 09/25/21  2:49 PM  Result Value Ref Range   Lactic Acid, Venous 2.5 (HH) 0.5 - 1.9 mmol/L    Comment: CRITICAL VALUE NOTED. VALUE IS CONSISTENT WITH PREVIOUSLY REPORTED/CALLED VALUE Performed at [facility], [place] [address], [place], [place] [address]   Procalcitonin     Status: None   Collection Time: 09/25/21  2:49 PM  Result Value Ref Range   Procalcitonin <0.10 ng/mL    Comment:  Interpretation: PCT (Procalcitonin) <= 0.5 ng/mL: Systemic infection (sepsis) is not likely. Local bacterial infection is possible. (NOTE)       Sepsis PCT Algorithm           Lower Respiratory Tract                                      Infection PCT Algorithm    ----------------------------     ----------------------------         PCT < 0.25 ng/mL                PCT < 0.10 ng/mL          Strongly encourage             Strongly discourage   discontinuation of antibiotics    initiation of antibiotics    ----------------------------     -----------------------------       PCT 0.25 - 0.50 ng/mL            PCT 0.10 - 0.25 ng/mL               OR       >80% decrease in PCT            Discourage initiation of                                             antibiotics      Encourage discontinuation           of antibiotics    ----------------------------     -----------------------------         PCT >= 0.50 ng/mL              PCT 0.26 - 0.50 ng/mL               AND        <80% decrease in PCT             Encourage initiation of                                             antibiotics       Encourage continuation           of antibiotics    ----------------------------     -----------------------------        PCT >= 0.50 ng/mL                  PCT > 0.50 ng/mL               AND         increase in PCT                  Strongly encourage                                      initiation of antibiotics    Strongly encourage escalation           of antibiotics                                     -----------------------------  PCT <= 0.25 ng/mL                                                 OR                                        > 80% decrease in PCT                                      Discontinue / Do not initiate                                             antibiotics  Performed at [facility], [place] [address], [place], [place] [address]   Troponin I (High Sensitivity)     Status: Abnormal   Collection Time: 09/25/21  2:49 PM  Result Value Ref Range   Troponin I (High Sensitivity) 84 (H) <18 ng/L    Comment: (NOTE) Elevated high sensitivity troponin I (hsTnI) values and significant  changes across serial measurements may suggest ACS but many other  chronic and acute conditions are known to elevate hsTnI results.  Refer to the "Links" section for chest pain algorithms and additional  guidance. Performed at [facility], [place] [address] [place], [place] [address]   CBG monitoring, ED     Status: Abnormal   Collection Time: 09/25/21  4:24 PM  Result Value Ref Range   Glucose-Capillary 209 (H) 70 - 99 mg/dL    Comment: Glucose reference range applies only to  samples taken after fasting for at least 8 hours.  Lactic acid, plasma     Status: Abnormal   Collection Time: 09/25/21  4:25 PM  Result Value Ref Range   Lactic Acid, Venous 3.2 (HH) 0.5 - 1.9 mmol/L    Comment: CRITICAL VALUE NOTED. VALUE IS CONSISTENT WITH PREVIOUSLY REPORTED/CALLED VALUE Performed at [facility], [place] [address], [place], [place] [address]   Troponin I (High Sensitivity)     Status: Abnormal   Collection Time: 09/25/21  4:25 PM  Result Value Ref Range   Troponin I (High Sensitivity) 85 (H) <18 ng/L    Comment: (NOTE) Elevated high sensitivity troponin I (hsTnI) values and significant  changes across serial measurements may suggest ACS but many other  chronic and acute conditions are known to elevate hsTnI results.  Refer to the "Links" section for chest pain algorithms and additional  guidance. Performed at [facility], [place] [address], [place], [place] [address]     Assessment and Plan:  Acute respiratory failure with hypoxia requiring noninvasive positive pressure ventilation with BiPAP likely secondary to COVID pneumonia as well as decompensated CHF in the setting of A-fib with RVR -Has a multifactorial decompensation and tested positive COVID -Given Lasix in the ED and will continue -Had to be placed on BiPAP and cardiology was consulted -ABG    Component Value Date/Time   PHART 7.370 09/25/2021 1018   PCO2ART 38.3 09/25/2021 1018   PO2ART 74 (L) 09/25/2021 1018   HCO3  22.2 09/25/2021 1018   TCO2 23 09/25/2021 1018   ACIDBASEDEF 3.0 (H) 09/25/2021 1018   O2SAT 95 09/25/2021 1018   -SpO2: 96 % O2 Flow Rate (L/min): 3 L/min FiO2 (%): 40 % -Continuous pulse oximetry maintain O2 saturation greater than 90% -Continue supplemental oxygen via nasal cannula wean O2 as tolerated -He will will need an ambulatory home O2 screen prior to discharge as well as a repeat chest x-ray  COVID-19 infection -COVID-19 Labs  Recent Labs     09/25/21 1227  DDIMER 1.00*  FERRITIN 113  LDH 139  CRP 4.1*    Lab Results  Component Value Date   SARSCOV2NAA POSITIVE (A) 09/25/2021   [place] NEGATIVE 12/21/2020   [place] NEGATIVE 07/28/2020   [place] NEGATIVE 04/08/2020   -Continue with airborne and droplet precautions -Given Solu-Medrol 1 mg/kg IV and then transitioning to oral prednisone -Follow inflammatory markers -Check blood cultures x2 as well as procalcitonin level -Lactic acid level continues to be elevated and is trending up and went from 2.7 -> 2.8 -> 2.5 -> 3.2 -Continue with supportive care and nebs as well as oxygen support and BiPAP as needed for increased work of breathing -Continue with flutter valve and incentive spirometer as well as bronchodilators -Pulmonary has been consulted for further evaluation and there is a to monitor the patient overnight in the ICU -Start diuresis  Acute metabolic encephalopathy and confusion in the setting of above -CT head done and "No acute intracranial abnormalities. Encephalomalacia within the left parietal subcortical white matter and cortex compatible with recent posterior left MCA territory infarct. Chronic small vessel ischemic disease and brain atrophy" -Could also be from hypoxia -Urinalysis done and showed many bacteria, 0-5 squamous epithelial cells and 0-5 RBCs per high-power field and 21-50 WBCs -Continue treatment as above to see if he improves and pulmonary recommending limiting sedating meds  Permanent A-fib with RVR -Patient's heart rate is elevated and we will switch his DuoNeb to Xopenex and Atrovent now -Repeat echocardiogram and cardiology was consulted -Troponin was mildly elevated and relatively flat and likely is demand ischemia due to acute respiratory failure with hypoxia in the setting of A-fib with RVR as well as acute on chronic CHF -Given metoprolol IV -Check TSH -Chads 2 vas score is 7 and cardiology is recommending rate control and  continue thromboembolic prophylaxis with apixaban  Acute on chronic heart failure with reduced ejection fraction Hypertension and hyperlipidemia -Repeat echo here -Chest x-ray done and showed "Pulmonary edema and cardiomegaly consistent with CHF." -BNP was 1146.4 and he did receive 1 L prior to coming in -Cardiology consulted and he has had a very thorough work-up at the [place] has been difficult in outpatient setting given his syncope, renal deficiency and hypotension -Cardiology recommending continue diuresis with IV Lasix 40 mg twice daily and starting Farxiga with uptitrating GDMT -Recommend strict I's and O's and daily weights and continuing to monitor telemetry and wean BiPAP as tolerated  COPD -Treatment as above  History of CVA -CT scan noted as above -Minimize all sedating medications -Continue PT and OT when more awake  Hyperbilirubinemia -Patient's T. bili is now 1.8 -Continue monitor trend and repeat CMP in a.m.  Hypoalbuminemia -Patient's albumin level is now 3.2 Continue monitor trend repeat CMP in a.m.  Renal insufficiency/chronic kidney disease stage IIIa -Patient's BUNs/creatinine is now 19/1.33 -Avoid further nephrotoxic medications, contrast dyes, hypotension and dehydration to ensure adequate renal perfusion and will need to renally adjust medications -Currently  getting diuresis as above -Continue monitor trend and repeat CMP in a.m.   Advance Care Planning:   Code Status: Prior full code  Consults: Cardiology and pulmonology  Family Communication: Discussed with the patient's wife and daughter at bedside  Severity of Illness: The appropriate patient status for this patient is INPATIENT. Inpatient status is judged to be reasonable and necessary in order to provide the required intensity of service to ensure the patient's safety. The patient's presenting symptoms, physical exam findings, and initial radiographic and laboratory  data in the context of their chronic comorbidities is felt to place them at high risk for further clinical deterioration. Furthermore, it is not anticipated that the patient will be medically stable for discharge from the hospital within 2 midnights of admission.   * I certify that at the point of admission it is my clinical judgment that the patient will require inpatient hospital care spanning beyond 2 midnights from the point of admission due to high intensity of service, high risk for further deterioration and high frequency of surveillance required.*  Author: Raiford Noble, DO 09/25/2021 1:20 PM  For on call review [email or web address].

## 2021-09-26 ENCOUNTER — Inpatient Hospital Stay (HOSPITAL_COMMUNITY): Payer: No Typology Code available for payment source

## 2021-09-26 DIAGNOSIS — J9601 Acute respiratory failure with hypoxia: Secondary | ICD-10-CM | POA: Diagnosis not present

## 2021-09-26 DIAGNOSIS — N183 Chronic kidney disease, stage 3 unspecified: Secondary | ICD-10-CM

## 2021-09-26 DIAGNOSIS — N179 Acute kidney failure, unspecified: Secondary | ICD-10-CM | POA: Diagnosis not present

## 2021-09-26 DIAGNOSIS — R778 Other specified abnormalities of plasma proteins: Secondary | ICD-10-CM

## 2021-09-26 DIAGNOSIS — I5021 Acute systolic (congestive) heart failure: Secondary | ICD-10-CM | POA: Diagnosis not present

## 2021-09-26 DIAGNOSIS — U071 COVID-19: Secondary | ICD-10-CM

## 2021-09-26 DIAGNOSIS — I480 Paroxysmal atrial fibrillation: Secondary | ICD-10-CM

## 2021-09-26 LAB — COMPREHENSIVE METABOLIC PANEL
ALT: 13 U/L (ref 0–44)
AST: 17 U/L (ref 15–41)
Albumin: 3.2 g/dL — ABNORMAL LOW (ref 3.5–5.0)
Alkaline Phosphatase: 110 U/L (ref 38–126)
Anion gap: 10 (ref 5–15)
BUN: 29 mg/dL — ABNORMAL HIGH (ref 8–23)
CO2: 26 mmol/L (ref 22–32)
Calcium: 10.4 mg/dL — ABNORMAL HIGH (ref 8.9–10.3)
Chloride: 105 mmol/L (ref 98–111)
Creatinine, Ser: 1.52 mg/dL — ABNORMAL HIGH (ref 0.61–1.24)
GFR, Estimated: 43 mL/min — ABNORMAL LOW (ref >60)
Glucose, Bld: 149 mg/dL — ABNORMAL HIGH (ref 70–99)
Potassium: 4.5 mmol/L (ref 3.5–5.1)
Sodium: 141 mmol/L (ref 135–145)
Total Bilirubin: 1.4 mg/dL — ABNORMAL HIGH (ref 0.3–1.2)
Total Protein: 7.2 g/dL (ref 6.5–8.1)

## 2021-09-26 LAB — CBC WITH DIFFERENTIAL/PLATELET
Abs Immature Granulocytes: 0.03 10*3/uL (ref 0.00–0.07)
Basophils Absolute: 0 10*3/uL (ref 0.0–0.1)
Basophils Relative: 0 %
Eosinophils Absolute: 0 10*3/uL (ref 0.0–0.5)
Eosinophils Relative: 0 %
HCT: 40 % (ref 39.0–52.0)
Hemoglobin: 12.5 g/dL — ABNORMAL LOW (ref 13.0–17.0)
Immature Granulocytes: 0 %
Lymphocytes Relative: 7 %
Lymphs Abs: 0.5 10*3/uL — ABNORMAL LOW (ref 0.7–4.0)
MCH: 27.8 pg (ref 26.0–34.0)
MCHC: 31.3 g/dL (ref 30.0–36.0)
MCV: 89.1 fL (ref 80.0–100.0)
Monocytes Absolute: 0.1 10*3/uL (ref 0.1–1.0)
Monocytes Relative: 2 %
Neutro Abs: 6.9 10*3/uL (ref 1.7–7.7)
Neutrophils Relative %: 91 %
Platelets: 246 10*3/uL (ref 150–400)
RBC: 4.49 MIL/uL (ref 4.22–5.81)
RDW: 14.9 % (ref 11.5–15.5)
WBC: 7.6 10*3/uL (ref 4.0–10.5)
nRBC: 0 % (ref 0.0–0.2)

## 2021-09-26 LAB — ECHOCARDIOGRAM COMPLETE
AR max vel: 2.71 cm2
AV Area VTI: 2.71 cm2
AV Area mean vel: 2.57 cm2
AV Mean grad: 3.5 mmHg
AV Peak grad: 6.2 mmHg
Ao pk vel: 1.24 m/s
Area-P 1/2: 5.79 cm2
Calc EF: 37.1 %
Height: 76 in
MV M vel: 4.19 m/s
MV Peak grad: 70.2 mmHg
S' Lateral: 3.8 cm
Single Plane A2C EF: 34.2 %
Single Plane A4C EF: 38.1 %
Weight: 3040.58 oz

## 2021-09-26 LAB — GLUCOSE, CAPILLARY
Glucose-Capillary: 144 mg/dL — ABNORMAL HIGH (ref 70–99)
Glucose-Capillary: 136 mg/dL — ABNORMAL HIGH (ref 70–99)
Glucose-Capillary: 144 mg/dL — ABNORMAL HIGH (ref 70–99)
Glucose-Capillary: 225 mg/dL — ABNORMAL HIGH (ref 70–99)
Glucose-Capillary: 154 mg/dL — ABNORMAL HIGH (ref 70–99)
Glucose-Capillary: 304 mg/dL — ABNORMAL HIGH (ref 70–99)

## 2021-09-26 LAB — TSH: TSH: 0.513 u[IU]/mL (ref 0.350–4.500)

## 2021-09-26 LAB — PHOSPHORUS: Phosphorus: 3.9 mg/dL (ref 2.5–4.6)

## 2021-09-26 LAB — MAGNESIUM: Magnesium: 2 mg/dL (ref 1.7–2.4)

## 2021-09-26 MED ORDER — METOPROLOL TARTRATE 12.5 MG HALF TABLET
12.5000 mg | ORAL_TABLET | Freq: Once | ORAL | Status: AC
  Administered 2021-09-26: 12.5 mg via ORAL
  Filled 2021-09-26: qty 1

## 2021-09-26 MED ORDER — METOPROLOL TARTRATE 50 MG PO TABS
50.0000 mg | ORAL_TABLET | Freq: Two times a day (BID) | ORAL | Status: DC
  Administered 2021-09-26 – 2021-09-28 (×4): 50 mg via ORAL
  Filled 2021-09-26 (×4): qty 1

## 2021-09-26 MED ORDER — SPIRONOLACTONE 12.5 MG HALF TABLET
12.5000 mg | ORAL_TABLET | Freq: Every day | ORAL | Status: DC
  Administered 2021-09-26 – 2021-09-28 (×3): 12.5 mg via ORAL
  Filled 2021-09-26 (×3): qty 1

## 2021-09-26 MED ORDER — ORAL CARE MOUTH RINSE: 15.0000 mL | OROMUCOSAL | Status: DC | PRN

## 2021-09-26 MED ORDER — ENSURE ENLIVE PO LIQD
237.0000 mL | Freq: Two times a day (BID) | ORAL | Status: DC
  Administered 2021-09-27 – 2021-09-28 (×3): 237 mL via ORAL

## 2021-09-26 MED ORDER — FUROSEMIDE 10 MG/ML IJ SOLN: 40.0000 mg | Freq: Every day | INTRAMUSCULAR | Status: DC

## 2021-09-26 NOTE — Evaluation (Signed)
Physical Therapy Evaluation Patient Details Name: Jordan Hoffman MRN: [id number] DOB: 1928/04/27 Today's Date: 09/26/2021  History of Present Illness  Pt  is 88 yo male admitted on 09/25/21 with COVID resp failure.  Pt was at SNF [facility]) for rehab s/p CVA with R sided weakness.  He also recently had cholecystectomy 08/27/21.  Pt with other hx including afib, back pain, DM2, HTN, HLD, R TKA, and recent L THA.  Clinical Impression  Pt admitted with above diagnosis. At baseline, pt was ambulatory with a cane for short community distances; however, he had recently had CVA and was working with therapy at [facility] - he was able to ambulate. Today, pt demonstrating R UE weakness but LE strength equal. He required min guard to min A for transfers and did fatigue easily.  Pt was on 2 L O2 but tolerated rest on RA, O2 needed to maintain sats with activity. Recommend return to SNF to complete therapy prior to return home Pt currently with functional limitations due to the deficits listed below (see PT Problem List). Pt will benefit from skilled PT to increase their independence and safety with mobility to allow discharge to the venue listed below.    SATURATION QUALIFICATIONS: (This note is used to comply with regulatory documentation for home oxygen)  Patient Saturations on Room Air at Rest = 94%  Patient Saturations on Room Air while Ambulating = 87%  Patient Saturations on 2 Liters of oxygen while Ambulating = 94%  Please briefly explain why patient needs home oxygen: To maintain sats with activity        Recommendations for follow up therapy are one component of a multi-disciplinary discharge planning process, led by the attending physician.  Recommendations may be updated based on patient status, additional functional criteria and insurance authorization.  Follow Up Recommendations Skilled nursing-short term rehab (<3 hours/day) Can patient physically be transported by private vehicle: Yes     Assistance Recommended at Discharge Intermittent Supervision/Assistance  Patient can return home with the following  A little help with walking and/or transfers;A little help with bathing/dressing/bathroom;Assistance with cooking/housework;Help with stairs or ramp for entrance    Equipment Recommendations None recommended by PT  Recommendations for Other Services       Functional Status Assessment Patient has had a recent decline in their functional status and demonstrates the ability to make significant improvements in function in a reasonable and predictable amount of time.     Precautions / Restrictions Precautions Precautions: Fall      Mobility  Bed Mobility Overal bed mobility: Needs Assistance Bed Mobility: Supine to Sit     Supine to sit: Supervision          Transfers Overall transfer level: Needs assistance Equipment used: Rolling walker (2 wheels) Transfers: Sit to/from Stand Sit to Stand: Min assist           General transfer comment: cues for hand placement and light min A to rise; performed x 2    Ambulation/Gait Ambulation/Gait assistance: Min guard Gait Distance (Feet): 30 Feet Assistive device: Rolling walker (2 wheels) Gait Pattern/deviations: Step-through pattern, Decreased stride length Gait velocity: decreased     General Gait Details: Min guard for safety and stability; cues for RW use; fatigued Programmer, applications    Modified Rankin (Stroke Patients Only)       Balance Overall balance assessment: Needs assistance Sitting-balance support: No upper extremity supported Sitting balance-Leahy  Scale: Good     Standing balance support: Bilateral upper extremity supported, Reliant on assistive device for balance Standing balance-Leahy Scale: Poor                               Pertinent Vitals/Pain Pain Assessment Pain Assessment: No/denies pain    Home Living Family/patient expects  to be discharged to:: Private residence Living Arrangements: Spouse/significant other Available Help at Discharge: Family;Available 24 hours/day (wife available and kids live nearby) Type of Home: House Home Access: Level entry       Home Layout: One level;Other (Comment) (Basically 1 level but does have 2 steps to go up to addition) Home Equipment: Cane - single point;Rolling Walker (2 wheels);Rollator (4 wheels);Grab bars - tub/shower;Shower seat Additional Comments: reports shower chair is too small    Prior Function               Mobility Comments: Was at rehab for CVA with R sided weakness and was about ready to d/c home but contracted COVID.  Was walking with help and RW at rehab.  Prior to CVA could ambulate short community distances with cane and up/down 2 steps at home ADLs Comments: Was independent with ADLs and light IADLs prior to recent CVA     Hand Dominance        Extremity/Trunk Assessment   Upper Extremity Assessment Upper Extremity Assessment: RUE deficits/detail;LUE deficits/detail RUE Deficits / Details: Bil shoulder with very limited ROM; R UE strength grossly 4/5 LUE Deficits / Details: Bil shoulder with very limited ROM; L UE strength grossly 5/5    Lower Extremity Assessment Lower Extremity Assessment: Overall WFL for tasks assessed (ROM WFL; MMT grossly 5/5 throughout)    Cervical / Trunk Assessment Cervical / Trunk Assessment: Normal  Communication   Communication: No difficulties  Cognition Arousal/Alertness: Awake/alert Behavior During Therapy: WFL for tasks assessed/performed Overall Cognitive Status: Within Functional Limits for tasks assessed                                 General Comments: Slower to respond at times; daughter reports much better than admission        General Comments General comments (skin integrity, edema, etc.): Pt on 2 L with sats 98% ; tried RA and sats 94% rest ; poor pleth with ambulation but  reading 87% on RA and quickly returning to 94%; Sats were 94% with activity on 2 L; back on 2 L post tx with sats 98%    Exercises     Assessment/Plan    PT Assessment Patient needs continued PT services  PT Problem List Decreased strength;Decreased mobility;Decreased coordination;Decreased activity tolerance;Cardiopulmonary status limiting activity;Decreased cognition;Decreased balance;Decreased knowledge of use of DME       PT Treatment Interventions DME instruction;Therapeutic exercise;Gait training;Balance training;Stair training;Functional mobility training;Therapeutic activities;Patient/family education    PT Goals (Current goals can be found in the Care Plan section)  Acute Rehab PT Goals Patient Stated Goal: return to SNF to finish rehab PT Goal Formulation: With patient/family Time For Goal Achievement: 10/10/21 Potential to Achieve Goals: Good    Frequency Min 3X/week     Co-evaluation               AM-PAC PT "6 Clicks" Mobility  Outcome Measure Help needed turning from your back to your side while in a flat bed without using bedrails?:  None Help needed moving from lying on your back to sitting on the side of a flat bed without using bedrails?: A Little Help needed moving to and from a bed to a chair (including a wheelchair)?: A Little Help needed standing up from a chair using your arms (e.g., wheelchair or bedside chair)?: A Little Help needed to walk in hospital room?: A Little Help needed climbing 3-5 steps with a railing? : A Lot 6 Click Score: 18    End of Session Equipment Utilized During Treatment: Gait belt Activity Tolerance: Patient tolerated treatment well Patient left: with chair alarm set;in chair;with call bell/phone within reach;with family/visitor present Nurse Communication: Mobility status PT Visit Diagnosis: Other abnormalities of gait and mobility (R26.89);Hemiplegia and hemiparesis Hemiplegia - Right/Left: Right Hemiplegia -  dominant/non-dominant: Dominant Hemiplegia - caused by: Cerebral infarction    Time: 2284-0698 PT Time Calculation (min) (ACUTE ONLY): 30 min   Charges:   PT Evaluation $PT Eval Moderate Complexity: 1 Mod PT Treatments $Gait Training: 8-22 mins        Abran Richard, PT Acute Rehab [facility] [phone]   Karlton Lemon 09/26/2021, 5:18 PM

## 2021-09-26 NOTE — Progress Notes (Signed)
PROGRESS NOTE    Jordan Hoffman  OZY:[id number] DOB: [birth year] DOA: 09/25/2021 PCP: [facility] [place]   Brief Narrative:  Jordan Hoffman is a 88 y.o. male with medical history significant of but not limited to chronic atrial fibrillation on anticoagulation, back pain, COPD, diabetes mellitus type 2, hypertension, hyperlipidemia, history of knee pain status post total knee replacement, history of recent left hip replacement and TURP and other comorbidities who presented to the ED with altered mental status and worsening shortness of breath.  Of note the patient underwent a recent cholecystectomy back in August 27, 2021 at [facility] and was discharged to a SNF at [facility] point hospital.  In addition to his recent cholecystectomy had a recent CVA with residual right-sided weakness for which he is making progress at the rehab.  Patient was possibly discharged this upcoming Friday unfortunately contracted COVID at the facility.  While at the facility he was noted to be altered and confused and per family report had not been acting like himself.  He had not been sleeping and then developed progressively worsening shortness of breath.  EMS was called on the morning of 09/25/2021 and they found him to have saturations in the 80s and 90s and he was in A-fib with RVR.  He was given 1 L of LR, albuterol and Atrovent as well as magnesium and also given 125 mg of Solu-Medrol en route.  While in EMS he was placed on nonrebreather   On arrival to the emergency room he initially required 4 L supplemental oxygen which then subsequently had to be placed on BiPAP.  ABG was done and he tested positive for COVID as well.  Chest x-ray showed significant cardiomegaly and vascular congestion and given IV Lasix and breathing treatments however given that he continued to have BiPAP needs and became more somnolent on the BiPAP PCCM was consulted for his respiratory failure.  He had been transferred to the ICU at that time but since  he stabilized he was transferred back to the [facility] service on 09/26/2021 and is now out of the ICU.  BiPAP has been made nightly and as needed and he is improving.  Mentation is doing a lot better today he is awake and alert and oriented.  Steroids are being weaned and transition to p.o.  He is still getting diuresed per cardiology is now decreasing his diuresis to just 40 mg p.o. daily.  Overall he is improved and will need an Ambulatory home O2 screen prior to discharge and continue to monitor closely.  Assessment and Plan:  Acute respiratory failure with hypoxia requiring noninvasive positive pressure ventilation with BiPAP likely secondary to COVID pneumonia as well as decompensated CHF in the setting of A-fib with RVR -Has a multifactorial decompensation and tested positive COVID -Given Lasix in the ED and will continue -Had to be placed on BiPAP and cardiology was consulted -ABG    Component Value Date/Time   PHART 7.370 09/25/2021 1018   PCO2ART 38.3 09/25/2021 1018   PO2ART 74 (L) 09/25/2021 1018   HCO3 22.2 09/25/2021 1018   TCO2 23 09/25/2021 1018   ACIDBASEDEF 3.0 (H) 09/25/2021 1018   O2SAT 95 09/25/2021 1018   -SpO2: 100 % O2 Flow Rate (L/min): 2 L/min FiO2 (%): 40 % -Continuous pulse oximetry maintain O2 saturation greater than 90% -Continue supplemental oxygen via nasal cannula wean O2 as tolerated -He will will need an ambulatory home O2 screen prior to discharge as well as a repeat chest  x-ray; he is improving and respiratory status is improving as well -Pulmonary consulted and felt this is multifactorial in addition with CHF and COVID but they feel that COVID is more likely an incidental finding -He was given IV Solu-Medrol which is now been changed to prednisone and pulm recommending continued diuresis as well as airborne and droplet precautions   COVID-19 infection -Inflammatory marker trend  Recent Labs    09/25/21 1227  DDIMER 1.00*  FERRITIN 113  LDH 139  CRP  4.1*    Lab Results  Component Value Date   SARSCOV2NAA POSITIVE (A) 09/25/2021   [place] NEGATIVE 12/21/2020   [place] NEGATIVE 07/28/2020   [place] NEGATIVE 04/08/2020     -Continue with airborne and droplet precautions -Given Solu-Medrol 1 mg/kg IV for 3 days and will be transition to prednisone by pulmonary recommendations at 50 mg daily on Wednesday, 09/28/2021 -Follow inflammatory markers -Check blood cultures x2 as well as procalcitonin level and still not done -Lactic acid level continues to be elevated and is trending up and went from 2.7 -> 2.8 -> 2.5 -> 3.2 on last check and will need to repeat in the AM -Continue with supportive care and nebs as well as oxygen support and BiPAP as needed for increased work of breathing -Continue with flutter valve and incentive spirometer as well as bronchodilators -Pulmonary had been consulted for further evaluation and there is a to monitor the patient overnight in the ICU but he is doing better and has been transferred out of the ICU to the progressive care unit on 3 E. -Start diuresis and cardiology adjusting Lasix as below   Acute metabolic encephalopathy and confusion in the setting of above -CT head done and "No acute intracranial abnormalities. Encephalomalacia within the left parietal subcortical white matter and cortex compatible with recent posterior left MCA territory infarct. Chronic small vessel ischemic disease and brain atrophy" -Could also be from hypoxia -Urinalysis done and showed many bacteria, 0-5 squamous epithelial cells and 0-5 RBCs per high-power field and 21-50 WBCs -Continue treatment as above to see if he improves and pulmonary recommending limiting sedating meds   Permanent A-fib with RVR -Patient's heart rate is elevated and we will switch his DuoNeb to Xopenex and Atrovent now -Repeat echocardiogram and cardiology was consulted -Troponin was mildly elevated and relatively flat and likely is demand  ischemia due to acute respiratory failure with hypoxia in the setting of A-fib with RVR as well as acute on chronic CHF -Given metoprolol IV -Checked TSH and is now 0.513 -Chads 2 vas score is 7 and cardiology is recommending rate control and continue thromboembolic prophylaxis with Apixaban -Cardiology following and ECHO done as below    Acute on chronic heart failure with reduced ejection fraction Hypertension and hyperlipidemia Elevated troponin -Repeat echo here -Chest x-ray done yesterday showed "Pulmonary edema and cardiomegaly consistent with CHF." -BNP was 1146.4 and he did receive 1 L prior to coming in -Cardiology consulted and he has had a very thorough work-up at the [facility]; they feel that his elevated troponin is demand ischemia in the setting of his acute illness -Titration of GDMT has been difficult in outpatient setting given his syncope, renal deficiency and hypotension -ECHO done and showed "Prior study outside hospital 08/28/2021: LVEF 30-35%, severe global hypokinesis, mild LAE, moderate RAE, moderate MR, mild to moderate TR, IVC dilated, no pericardial effusion."  -Cardiology recommending continue diuresis with IV Lasix 40 mg twice daily and starting Farxiga with uptitrating GDMT if able;  given his renal dysfunction and slight elevation in the creatinine cardiology is weaning back his diuresis is a 40 mg daily IV -Recommend strict I's and O's and daily weights and continuing to monitor telemetry and wean BiPAP as tolerated -Patient is negative 465 mL since admission -Repeat chest x-ray done and showed "Improving vascular congestion and interstitial edema. Small pleural effusions are unchanged. Left lower lobe patchy dense consolidation is unchanged."   COPD -Treatment as above -Oxygen is being weaned and he is off the BiPAP.  Will need an ambulatory home O2 screen and repeat chest x-ray in the a.m.   History of CVA -CT scan noted as above -Minimize all  sedating medications -Continue PT and OT when more awake and we will order this now   Hyperbilirubinemia -Patient's T. bili is gone from 1.8 and is now 1.4 -Continue monitor trend and repeat CMP in a.m.  Hypercalcemia -Patient's calcium level from 10.2 and is now down to 10.4 -We will need to continue monitor and trend and repeat calcium level in a.m.   Hypoalbuminemia -Patient's albumin level is now 3.2 again Continue monitor trend repeat CMP in a.m.   Acute on Chronic kidney disease stage IIIa -Patient's BUNs/creatinine is gone from 19/1.33 and trended up to 29/1.52 -Getting IV diuresis with Lasix twice daily but cardiology has been changed to just daily given that he has slight bump -Avoid further nephrotoxic medications, contrast dyes, hypotension and dehydration to ensure adequate renal perfusion and will need to renally adjust medications -Currently getting diuresis as above -Continue monitor trend and repeat CMP in a.m.  Normocytic Anemia -Patient's hemoglobin/hematocrit went from 13.6/40.0 on admission and is now 12.5/40.0 -Check anemia panel in a.m. -Continue to monitor for signs and symptoms of bleeding; no overt bleeding noted -Repeat CBC in a.m.    DVT prophylaxis: apixaban (ELIQUIS) tablet 2.5 mg Start: 09/25/21 1754 SCDs Start: 09/25/21 1350 apixaban (ELIQUIS) tablet 2.5 mg    Code Status: Prior Family Communication: Discussed with family at bedside  Disposition Plan:  Level of care: Progressive Status is: Inpatient Remains inpatient appropriate because: He is improving slowly and has been moved to the progressive care unit now   Consultants:  PCCM  Cardiology  Procedures:  ECHOCARDIOGRAM IMPRESSIONS     1. Left ventricular ejection fraction, by estimation, is 30 to 35%. The  left ventricle has moderately decreased function. The left ventricle  demonstrates global hypokinesis. There is moderate left ventricular  hypertrophy. Left ventricular diastolic   function could not be evaluated due to Afib.   2. Right ventricular systolic function is normal. The right ventricular  size is mildly enlarged. Mildly increased right ventricular wall  thickness. There is moderately elevated pulmonary artery systolic  pressure. The estimated right ventricular systolic   pressure is 09.8 mmHg.   3. Left atrial size was mildly dilated.   4. Right atrial size was dilated.   5. The mitral valve is normal in structure. Mild mitral valve  regurgitation. No evidence of mitral stenosis.   6. Tricuspid valve regurgitation is moderate.   7. The aortic valve is tricuspid. Aortic valve regurgitation is not  visualized. Aortic valve sclerosis is present, with no evidence of aortic  valve stenosis.   8. The inferior vena cava is dilated in size with <50% respiratory  variability, suggesting right atrial pressure of 15 mmHg.   9. Rhythm strip during this exam demonstrates atrial fibrillation.   Comparison(s): Prior study outside hospital 08/28/2021: LVEF 30-35%,  severe global hypokinesis, mild  LAE, moderate RAE, moderate MR, mild to  moderate TR, IVC dilated, no pericardial effusion.   FINDINGS   Left Ventricle: Left ventricular ejection fraction, by estimation, is 30  to 35%. The left ventricle has moderately decreased function. The left  ventricle demonstrates global hypokinesis. The left ventricular internal  cavity size was small. There is  moderate left ventricular hypertrophy. Left ventricular diastolic function  could not be evaluated due to Afib.   Right Ventricle: The right ventricular size is mildly enlarged. Mildly  increased right ventricular wall thickness. Right ventricular systolic  function is normal. There is moderately elevated pulmonary artery systolic  pressure. The tricuspid regurgitant  velocity is 3.05 m/s, and with an assumed right atrial pressure of 15  mmHg, the estimated right ventricular systolic pressure is 77.8 mmHg.   Left  Atrium: Left atrial size was mildly dilated.   Right Atrium: Right atrial size was dilated.   Pericardium: There is no evidence of pericardial effusion.   Mitral Valve: The mitral valve is normal in structure. Mild mitral valve  regurgitation. No evidence of mitral valve stenosis.   Tricuspid Valve: The tricuspid valve is grossly normal. Tricuspid valve  regurgitation is moderate . No evidence of tricuspid stenosis.   Aortic Valve: The aortic valve is tricuspid. Aortic valve regurgitation is  not visualized. Aortic valve sclerosis is present, with no evidence of  aortic valve stenosis. Aortic valve mean gradient measures 3.5 mmHg.  Aortic valve peak gradient measures 6.2   mmHg. Aortic valve area, by VTI measures 2.71 cm.   Pulmonic Valve: The pulmonic valve was grossly normal. Pulmonic valve  regurgitation is not visualized. No evidence of pulmonic stenosis.   Aorta: The aortic root and ascending aorta are structurally normal, with  no evidence of dilitation.   Venous: The inferior vena cava is dilated in size with less than 50%  respiratory variability, suggesting right atrial pressure of 15 mmHg.   IAS/Shunts: No atrial level shunt detected by color flow Doppler.   EKG: Rhythm strip during this exam demonstrates atrial fibrillation.      LEFT VENTRICLE  PLAX 2D  LVIDd:         4.50 cm      Diastology  LVIDs:         3.80 cm      LV e' medial:    6.42 cm/s  LV PW:         1.20 cm      LV E/e' medial:  14.0  LV IVS:        1.40 cm      LV e' lateral:   10.90 cm/s  LVOT diam:     2.30 cm      LV E/e' lateral: 8.2  LV SV:         49  LV SV Index:   23  LVOT Area:     4.15 cm     LV Volumes (MOD)  LV vol d, MOD A2C: 100.0 ml  LV vol d, MOD A4C: 161.0 ml  LV vol s, MOD A2C: 65.8 ml  LV vol s, MOD A4C: 99.6 ml  LV SV MOD A2C:     34.2 ml  LV SV MOD A4C:     161.0 ml  LV SV MOD BP:      49.2 ml   RIGHT VENTRICLE  RV Basal diam:  4.50 cm  RV Mid diam:    3.20 cm  RV  S prime:  13.40 cm/s  TAPSE (M-mode): 1.8 cm   LEFT ATRIUM             Index        RIGHT ATRIUM           Index  LA diam:        3.40 cm 1.57 cm/m   RA Area:     37.10 cm  LA Vol (A2C):   96.8 ml 44.66 ml/m  RA Volume:   141.00 ml 65.05 ml/m  LA Vol (A4C):   43.1 ml 19.88 ml/m  LA Biplane Vol: 68.0 ml 31.37 ml/m   AORTIC VALVE                    PULMONIC VALVE  AV Area (Vmax):    2.71 cm     PV Vmax:       0.86 m/s  AV Area (Vmean):   2.57 cm     PV Peak grad:  2.9 mmHg  AV Area (VTI):     2.71 cm  AV Vmax:           124.25 cm/s  AV Vmean:          87.775 cm/s  AV VTI:            0.180 m  AV Peak Grad:      6.2 mmHg  AV Mean Grad:      3.5 mmHg  LVOT Vmax:         81.03 cm/s  LVOT Vmean:        54.300 cm/s  LVOT VTI:          0.118 m  LVOT/AV VTI ratio: 0.65     AORTA  Ao Root diam: 4.10 cm  Ao Asc diam:  3.40 cm   MITRAL VALVE               TRICUSPID VALVE  MV Area (PHT): 5.79 cm    TR Peak grad:   37.2 mmHg  MV Decel Time: 131 msec    TR Vmax:        305.00 cm/s  MR Peak grad: 70.2 mmHg  MR Vmax:      419.00 cm/s  SHUNTS  MV E velocity: 89.70 cm/s  Systemic VTI:  0.12 m  MV A velocity: 26.30 cm/s  Systemic Diam: 2.30 cm  MV E/A ratio:  3.41   Antimicrobials:  Anti-infectives (From admission, onward)    Start     Dose/Rate Route Frequency Ordered Stop   09/26/21 1000  remdesivir 100 mg in sodium chloride 0.9 % 100 mL IVPB  Status:  Discontinued       See Hyperspace for full Linked Orders Report.   100 mg 200 mL/hr over 30 Minutes Intravenous Daily 09/25/21 1349 09/25/21 1355   09/25/21 1349  remdesivir 200 mg in sodium chloride 0.9% 250 mL IVPB  Status:  Discontinued       See Hyperspace for full Linked Orders Report.   200 mg 580 mL/hr over 30 Minutes Intravenous Once 09/25/21 1349 09/25/21 1355        Subjective: Seen and examined at bedside and he was much more awake and alert today.  Wearing supplemental oxygen and feels better.  Denies any  complaints.  Is oriented mostly but does not remember the president.  No other concerns at this time and off BiPAP.  Diuresing fairly well.  Objective: Vitals:   09/26/21 1230 09/26/21 1300 09/26/21 1358 09/26/21 1400  BP: 129/83 118/76  136/79  Pulse: 90 91  (!) 101  Resp: (!) 28 (!) 22  (!) 26  Temp:    97.6 F (36.4 C)  TempSrc:    Oral  SpO2: 95% 95%  100%  Weight:   82.5 kg   Height:   6\' 4"  (1.93 m)     Intake/Output Summary (Last 24 hours) at 09/26/2021 1443 Last data filed at 09/26/2021 1300 Gross per 24 hour  Intake 1500 ml  Output 1965 ml  Net -465 ml   Filed Weights   09/25/21 0827 09/26/21 0450 09/26/21 1358  Weight: 87 kg 86.2 kg 82.5 kg   Examination: Physical Exam:  Constitutional: Elderly African-American male currently no acute distress appears calm and more awake and alert. Respiratory: Diminished to auscultation bilaterally with coarse breath sounds crackles and slight rhonchi, no wheezing, rales. Normal respiratory effort and patient is not tachypenic. No accessory muscle use.  Unlabored breathing Cardiovascular: RRR, no murmurs / rubs / gallops. S1 and S2 auscultated.  No appreciable extremity edema Abdomen: Soft, non-tender, non-distended. Bowel sounds positive.  GU: Deferred. Musculoskeletal: No clubbing / cyanosis of digits/nails. No joint deformity upper and lower extremities.  Skin: No rashes, lesions, ulcers on limited skin evaluation. No induration; Warm and dry.  Neurologic: CN 2-12 grossly intact with no focal deficits. Romberg sign and cerebellar reflexes not assessed.  Psychiatric: Normal judgment and insight. Alert and oriented x 2. Normal mood and appropriate affect.   Data Reviewed: I have personally reviewed following labs and imaging studies  CBC: Recent Labs  Lab 09/25/21 0745 09/25/21 1018 09/26/21 0729  WBC 6.6  --  7.6  NEUTROABS 5.0  --  6.9  HGB 12.4* 13.6 12.5*  HCT 39.5 40.0 40.0  MCV 90.2  --  89.1  PLT 256  --  076    Basic Metabolic Panel: Recent Labs  Lab 09/25/21 0745 09/25/21 1018 09/26/21 0729  NA 141 140 141  K 4.4 4.2 4.5  CL 106  --  105  CO2 25  --  26  GLUCOSE 178*  --  149*  BUN 19  --  29*  CREATININE 1.33*  --  1.52*  CALCIUM 10.2  --  10.4*  MG 2.4  --  2.0  PHOS  --   --  3.9   GFR: Estimated Creatinine Clearance: 36.9 mL/min (A) (by C-G formula based on SCr of 1.52 mg/dL (H)). Liver Function Tests: Recent Labs  Lab 09/25/21 0745 09/26/21 0729  AST 22 17  ALT 13 13  ALKPHOS 121 110  BILITOT 1.8* 1.4*  PROT 7.5 7.2  ALBUMIN 3.2* 3.2*   No results for input(s): "LIPASE", "AMYLASE" in the last 168 hours. No results for input(s): "AMMONIA" in the last 168 hours. Coagulation Profile: No results for input(s): "INR", "PROTIME" in the last 168 hours. Cardiac Enzymes: No results for input(s): "CKTOTAL", "CKMB", "CKMBINDEX", "TROPONINI" in the last 168 hours. BNP (last 3 results) No results for input(s): "PROBNP" in the last 8760 hours. HbA1C: No results for input(s): "HGBA1C" in the last 72 hours. CBG: Recent Labs  Lab 09/25/21 2357 09/26/21 0410 09/26/21 0442 09/26/21 0746 09/26/21 1122  GLUCAP 214* 144* 136* 144* 225*   Lipid Profile: No results for input(s): "CHOL", "HDL", "LDLCALC", "TRIG", "CHOLHDL", "LDLDIRECT" in the last 72 hours. Thyroid Function Tests: Recent Labs    09/26/21 0729  TSH 0.513   Anemia Panel: Recent Labs    09/25/21 1227  FERRITIN 113   Sepsis  Labs: Recent Labs  Lab 09/25/21 0745 09/25/21 1227 09/25/21 1449 09/25/21 1625  PROCALCITON  --   --  <0.10  --   LATICACIDVEN 2.7* 2.8* 2.5* 3.2*    Recent Results (from the past 240 hour(s))  SARS Coronavirus 2 by RT PCR (hospital order, performed in [facility] hospital lab) *cepheid single result test* Anterior Nasal Swab     Status: Abnormal   Collection Time: 09/25/21  8:22 AM   Specimen: Anterior Nasal Swab  Result Value Ref Range Status   SARS Coronavirus 2 by RT PCR  POSITIVE (A) NEGATIVE Final    Comment: (NOTE) SARS-CoV-2 target nucleic acids are DETECTED  SARS-CoV-2 RNA is generally detectable in upper respiratory specimens  during the acute phase of infection.  Positive results are indicative  of the presence of the identified virus, but do not rule out bacterial infection or co-infection with other pathogens not detected by the test.  Clinical correlation with patient history and  other diagnostic information is necessary to determine patient infection status.  The expected result is negative.  Fact Sheet for Patients:   https://www.patel.info/   Fact Sheet for Healthcare Providers:   https://hall.com/    This test is not yet approved or cleared by the [place] FDA and  has been authorized for detection and/or diagnosis of SARS-CoV-2 by FDA under an Emergency Use Authorization (EUA).  This EUA will remain in effect (meaning this test can be used) for the duration of  the COVID-19 declaration under Section 564(b)(1)  of the Act, 21 U.S.C. section 360-bbb-3(b)(1), unless the authorization is terminated or revoked sooner.   Performed at [facility], [place] [address], [place], [place] [address]   MRSA Next Gen by PCR, Nasal     Status: None   Collection Time: 09/25/21  5:38 PM   Specimen: Nasal Mucosa; Nasal Swab  Result Value Ref Range Status   MRSA by PCR Next Gen NOT DETECTED NOT DETECTED Final    Comment: (NOTE) The GeneXpert MRSA Assay (FDA approved for NASAL specimens only), is one component of a comprehensive MRSA colonization surveillance program. It is not intended to diagnose MRSA infection nor to guide or monitor treatment for MRSA infections. Test performance is not FDA approved in patients less than 43 years old. Performed at [facility], [place] [address], [place], [place] [address]      Radiology Studies: ECHOCARDIOGRAM COMPLETE  Result Date: 09/26/2021     ECHOCARDIOGRAM REPORT   Patient Name:   DAQWAN DOUGAL Date of Exam: 09/26/2021 Medical Rec #:  [id number]   Height:       76.0 in Accession #:    [id number]  Weight:       190.0 lb Date of Birth:  [birth year]    BSA:          2.168 m Patient Age:    89 years    BP:           125/106 mmHg Patient Gender: M           HR:           98 bpm. Exam Location:  Inpatient Procedure: 2D Echo, Cardiac Doppler and Color Doppler Indications:     Acute Respiratory Distress; I48.1 Persistent atrial                  fibrillation; I50.41 Acute combined systolic (congestive) and  diastolic (congestive) heart failure; R06.02 SOB  History:         Patient has no prior history of Echocardiogram examinations.                  CHF and Cardiomyopathy, Stroke, Arrythmias:Atrial Fibrillation,                  Signs/Symptoms:Dyspnea, Risk Factors:Hypertension, Diabetes and                  Dyslipidemia.; Medications:Beta Blockers.  Sonographer:     Harvie Junior Referring Phys:  [id number] Cleaster Corin OLLIS Diagnosing Phys: Rex Kras DO IMPRESSIONS  1. Left ventricular ejection fraction, by estimation, is 30 to 35%. The left ventricle has moderately decreased function. The left ventricle demonstrates global hypokinesis. There is moderate left ventricular hypertrophy. Left ventricular diastolic function could not be evaluated due to Afib.  2. Right ventricular systolic function is normal. The right ventricular size is mildly enlarged. Mildly increased right ventricular wall thickness. There is moderately elevated pulmonary artery systolic pressure. The estimated right ventricular systolic  pressure is 01.0 mmHg.  3. Left atrial size was mildly dilated.  4. Right atrial size was dilated.  5. The mitral valve is normal in structure. Mild mitral valve regurgitation. No evidence of mitral stenosis.  6. Tricuspid valve regurgitation is moderate.  7. The aortic valve is tricuspid. Aortic valve regurgitation is not visualized. Aortic valve  sclerosis is present, with no evidence of aortic valve stenosis.  8. The inferior vena cava is dilated in size with <50% respiratory variability, suggesting right atrial pressure of 15 mmHg.  9. Rhythm strip during this exam demonstrates atrial fibrillation. Comparison(s): Prior study outside hospital 08/28/2021: LVEF 30-35%, severe global hypokinesis, mild LAE, moderate RAE, moderate MR, mild to moderate TR, IVC dilated, no pericardial effusion. FINDINGS  Left Ventricle: Left ventricular ejection fraction, by estimation, is 30 to 35%. The left ventricle has moderately decreased function. The left ventricle demonstrates global hypokinesis. The left ventricular internal cavity size was small. There is moderate left ventricular hypertrophy. Left ventricular diastolic function could not be evaluated due to Afib. Right Ventricle: The right ventricular size is mildly enlarged. Mildly increased right ventricular wall thickness. Right ventricular systolic function is normal. There is moderately elevated pulmonary artery systolic pressure. The tricuspid regurgitant velocity is 3.05 m/s, and with an assumed right atrial pressure of 15 mmHg, the estimated right ventricular systolic pressure is 93.2 mmHg. Left Atrium: Left atrial size was mildly dilated. Right Atrium: Right atrial size was dilated. Pericardium: There is no evidence of pericardial effusion. Mitral Valve: The mitral valve is normal in structure. Mild mitral valve regurgitation. No evidence of mitral valve stenosis. Tricuspid Valve: The tricuspid valve is grossly normal. Tricuspid valve regurgitation is moderate . No evidence of tricuspid stenosis. Aortic Valve: The aortic valve is tricuspid. Aortic valve regurgitation is not visualized. Aortic valve sclerosis is present, with no evidence of aortic valve stenosis. Aortic valve mean gradient measures 3.5 mmHg. Aortic valve peak gradient measures 6.2  mmHg. Aortic valve area, by VTI measures 2.71 cm. Pulmonic  Valve: The pulmonic valve was grossly normal. Pulmonic valve regurgitation is not visualized. No evidence of pulmonic stenosis. Aorta: The aortic root and ascending aorta are structurally normal, with no evidence of dilitation. Venous: The inferior vena cava is dilated in size with less than 50% respiratory variability, suggesting right atrial pressure of 15 mmHg. IAS/Shunts: No atrial level shunt detected by color flow Doppler. EKG: Rhythm strip  during this exam demonstrates atrial fibrillation.  LEFT VENTRICLE PLAX 2D LVIDd:         4.50 cm      Diastology LVIDs:         3.80 cm      LV e' medial:    6.42 cm/s LV PW:         1.20 cm      LV E/e' medial:  14.0 LV IVS:        1.40 cm      LV e' lateral:   10.90 cm/s LVOT diam:     2.30 cm      LV E/e' lateral: 8.2 LV SV:         49 LV SV Index:   23 LVOT Area:     4.15 cm  LV Volumes (MOD) LV vol d, MOD A2C: 100.0 ml LV vol d, MOD A4C: 161.0 ml LV vol s, MOD A2C: 65.8 ml LV vol s, MOD A4C: 99.6 ml LV SV MOD A2C:     34.2 ml LV SV MOD A4C:     161.0 ml LV SV MOD BP:      49.2 ml RIGHT VENTRICLE RV Basal diam:  4.50 cm RV Mid diam:    3.20 cm RV S prime:     13.40 cm/s TAPSE (M-mode): 1.8 cm LEFT ATRIUM             Index        RIGHT ATRIUM           Index LA diam:        3.40 cm 1.57 cm/m   RA Area:     37.10 cm LA Vol (A2C):   96.8 ml 44.66 ml/m  RA Volume:   141.00 ml 65.05 ml/m LA Vol (A4C):   43.1 ml 19.88 ml/m LA Biplane Vol: 68.0 ml 31.37 ml/m  AORTIC VALVE                    PULMONIC VALVE AV Area (Vmax):    2.71 cm     PV Vmax:       0.86 m/s AV Area (Vmean):   2.57 cm     PV Peak grad:  2.9 mmHg AV Area (VTI):     2.71 cm AV Vmax:           124.25 cm/s AV Vmean:          87.775 cm/s AV VTI:            0.180 m AV Peak Grad:      6.2 mmHg AV Mean Grad:      3.5 mmHg LVOT Vmax:         81.03 cm/s LVOT Vmean:        54.300 cm/s LVOT VTI:          0.118 m LVOT/AV VTI ratio: 0.65  AORTA Ao Root diam: 4.10 cm Ao Asc diam:  3.40 cm MITRAL VALVE                TRICUSPID VALVE MV Area (PHT): 5.79 cm    TR Peak grad:   37.2 mmHg MV Decel Time: 131 msec    TR Vmax:        305.00 cm/s MR Peak grad: 70.2 mmHg MR Vmax:      419.00 cm/s  SHUNTS MV E velocity: 89.70 cm/s  Systemic VTI:  0.12 m MV A velocity: 26.30 cm/s  Systemic Diam: 2.30 cm  MV E/A ratio:  3.41 Sunit Tolia DO Electronically signed by Rex Kras DO Signature Date/Time: 09/26/2021/12:13:41 PM    Final    DG Chest Port 1 View  Result Date: 09/26/2021 CLINICAL DATA:  Acute hypoxic respiratory failure with CHF. EXAM: PORTABLE CHEST 1 VIEW COMPARISON:  Portable chest yesterday at 7:53 a.m. FINDINGS: 5:13 a.m.  There is moderate to severe cardiomegaly. There is improvement in central vascular prominence and mild basilar interstitial edema with improvement with regression of edema from the upper lung fields and centrally. There are unchanged small pleural effusions and patchy dense consolidation in the left lower lobe. Perihilar ground-glass opacities are improved and probably due to resolving ground-glass edema. The mediastinum is stable with aortic tortuosity, atherosclerosis ectasia. There is advanced thoracic spondylosis.  Advanced shoulder DJD. IMPRESSION: 1. Improving vascular congestion and interstitial edema. 2. Small pleural effusions are unchanged. 3. Left lower lobe patchy dense consolidation is unchanged. Electronically Signed   By: Telford Nab M.D.   On: 09/26/2021 06:51   CT Head Wo Contrast  Result Date: 09/25/2021 CLINICAL DATA:  Altered mental status.  Nontraumatic. EXAM: CT HEAD WITHOUT CONTRAST TECHNIQUE: Contiguous axial images were obtained from the base of the skull through the vertex without intravenous contrast. RADIATION DOSE REDUCTION: This exam was performed according to the departmental dose-optimization program which includes automated exposure control, adjustment of the mA and/or kV according to patient size and/or use of iterative reconstruction technique. COMPARISON:   08/30/2021 FINDINGS: Brain: No evidence of acute infarction, hemorrhage, hydrocephalus, extra-axial collection or mass lesion/mass effect.Encephalomalacia within the left parietal subcortical white matter and cortex compatible with recent posterior left MCA territory infarct, image 22/3. Remote left basal ganglia lacunar infarct noted. There is mild diffuse low-attenuation within the subcortical and periventricular white matter compatible with chronic microvascular disease. Prominence of the sulci and ventricles compatible with brain atrophy. Vascular: No hyperdense vessel or unexpected calcification. Skull: Normal. Negative for fracture or focal lesion. Sinuses/Orbits: Chronic mucoperiosteal thickening involving the left maxillary sinus noted. Mastoid air cells are clear. Other: None. IMPRESSION: 1. No acute intracranial abnormalities. 2. Encephalomalacia within the left parietal subcortical white matter and cortex compatible with recent posterior left MCA territory infarct. 3. Chronic small vessel ischemic disease and brain atrophy. Electronically Signed   By: Kerby Moors M.D.   On: 09/25/2021 10:42   DG Chest 1 View  Result Date: 09/25/2021 CLINICAL DATA:  Altered mental status, short of breath EXAM: CHEST  1 VIEW COMPARISON:  Prior chest x-ray 08/26/2021 FINDINGS: Similar degree of marked cardiomegaly. Interval increase in diffuse pulmonary vascular congestion and interstitial airspace opacities with Kerley B-lines peripherally. Findings are consistent with interstitial pulmonary edema. No large pneumothorax or pleural effusion. No acute osseous abnormality. IMPRESSION: Pulmonary edema and cardiomegaly consistent with CHF. Electronically Signed   By: Jacqulynn Cadet M.D.   On: 09/25/2021 08:00     Scheduled Meds:  apixaban  2.5 mg Oral BID   arformoterol  15 mcg Nebulization BID   And   umeclidinium bromide  1 puff Inhalation Daily   vitamin C  500 mg Oral Daily   budesonide  1 mg Nebulization  BID   Chlorhexidine Gluconate Cloth  6 each Topical Daily   dapagliflozin propanediol  10 mg Oral Daily   fluticasone  2 spray Each Nare Daily   folic acid  1 mg Intravenous Daily   [START ON 09/27/2021] furosemide  40 mg Intravenous Daily   guaiFENesin  600 mg Oral BID   insulin  aspart  0-15 Units Subcutaneous Q4H   ipratropium-albuterol  3 mL Nebulization Once   latanoprost  1 drop Both Eyes QHS   methylPREDNISolone (SOLU-MEDROL) injection  90 mg Intravenous Q12H   Followed by   Derrill Memo ON 09/28/2021] predniSONE  50 mg Oral Daily   metoprolol tartrate  12.5 mg Oral Once   metoprolol tartrate  37.5 mg Oral BID   pantoprazole  40 mg Oral Daily   spironolactone  12.5 mg Oral Daily   thiamine  100 mg Oral Daily   Continuous Infusions:   LOS: 1 day   Raiford Noble, DO [facility] Available via Epic secure chat 7am-7pm After these hours, please refer to coverage provider listed on [email or web address] 09/26/2021, 2:43 PM

## 2021-09-26 NOTE — Progress Notes (Signed)
Heart Failure Navigator Progress Note  Assessed for Heart & Vascular TOC clinic readiness.  Patient does not meet criteria due to [facility] patient..     Quentin Shorey, BSN, RN Heart Failure Nurse Navigator Secure Chat Only   

## 2021-09-26 NOTE — Plan of Care (Signed)
?  Problem: Education: ?Goal: Ability to demonstrate management of disease process will improve ?Outcome: Progressing ?  ?
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
## 2021-09-26 NOTE — Progress Notes (Signed)
Progress Note  Patient Name: Jordan Hoffman Date of Encounter: 09/26/2021  Attending physician: Kerney Elbe, DO Primary care provider: [place]  Subjective: Jordan Hoffman is a 88 y.o. male who was seen and examined at bedside  Denies chest pain. Shortness of breath and altered mental status have improved significantly. He is more awake and alert compared to yesterday. No family present at bedside. Case discussed and reviewed with his nurse.  Objective: Vital Signs in the last 24 hours: Temp:  [97.6 F (36.4 C)-97.8 F (36.6 C)] 97.6 F (36.4 C) (09/11 1400) Pulse Rate:  [80-136] 101 (09/11 1400) Resp:  [16-33] 26 (09/11 1400) BP: (113-158)/(70-106) 136/79 (09/11 1400) SpO2:  [88 %-100 %] 100 % (09/11 1400) Weight:  [82.5 kg-86.2 kg] 82.5 kg (09/11 1358)  Intake/Output:  Intake/Output Summary (Last 24 hours) at 09/26/2021 1821 Last data filed at 09/26/2021 1300 Gross per 24 hour  Intake 1500 ml  Output 1965 ml  Net -465 ml    Net IO Since Admission: -465 mL [09/26/21 1821]  Weights:  Filed Weights   09/25/21 0827 09/26/21 0450 09/26/21 1358  Weight: 87 kg 86.2 kg 82.5 kg    Telemetry: Personally reviewed.  Atrial fibrillation with ectopy  Physical examination: PHYSICAL EXAM:    09/26/2021    2:00 PM 09/26/2021    1:58 PM 09/26/2021    1:00 PM  Vitals with BMI  Height  6\' 4"    Weight  181 lbs 14 oz   BMI  45.62   Systolic 563  893  Diastolic 79  76  Pulse 734  91    CONSTITUTIONAL: Appears older than stated age, hemodynamically stable, no acute distress.   SKIN: Skin is warm and dry. No rash noted. No cyanosis. No pallor. No jaundice HEAD: Normocephalic and atraumatic.  EYES: No scleral icterus MOUTH/THROAT: Moist oral membranes.  NECK: No JVD present. No thyromegaly noted. No carotid bruits  CHEST Normal respiratory effort. No intercostal retractions  LUNGS: Decreased breath sounds bilaterally.  No rales, rhonchi's, or expiratory  wheezes.   CARDIOVASCULAR: Irregularly irregular, variable S1-S2, no murmurs rubs or gallops appreciated ABDOMINAL: Soft, nontender, nondistended, positive bowel sounds in all 4 quadrants, no apparent ascites.  EXTREMITIES: No pitting edema, warm to touch,  HEMATOLOGIC: No significant bruising NEUROLOGIC: Oriented to person, place, and time. Nonfocal. Normal muscle tone.  PSYCHIATRIC: Normal mood and affect. Normal behavior. Cooperative  Lab Results: Hematology Recent Labs  Lab 09/25/21 0745 09/25/21 1018 09/26/21 0729  WBC 6.6  --  7.6  RBC 4.38  --  4.49  HGB 12.4* 13.6 12.5*  HCT 39.5 40.0 40.0  MCV 90.2  --  89.1  MCH 28.3  --  27.8  MCHC 31.4  --  31.3  RDW 14.9  --  14.9  PLT 256  --  246    Chemistry Recent Labs  Lab 09/25/21 0745 09/25/21 1018 09/26/21 0729  NA 141 140 141  K 4.4 4.2 4.5  CL 106  --  105  CO2 25  --  26  GLUCOSE 178*  --  149*  BUN 19  --  29*  CREATININE 1.33*  --  1.52*  CALCIUM 10.2  --  10.4*  PROT 7.5  --  7.2  ALBUMIN 3.2*  --  3.2*  AST 22  --  17  ALT 13  --  13  ALKPHOS 121  --  110  BILITOT 1.8*  --  1.4*  GFRNONAA 50*  --  66*  ANIONGAP 10  --  10     Cardiac Enzymes: Cardiac Panel (last 3 results) Recent Labs    09/25/21 1227 09/25/21 1449 09/25/21 1625  TROPONINIHS 75* 84* 85*    BNP (last 3 results) Recent Labs    09/25/21 0745  BNP 1,146.4*    ProBNP (last 3 results) No results for input(s): "PROBNP" in the last 8760 hours.   DDimer  Recent Labs  Lab 09/25/21 1227  DDIMER 1.00*     Hemoglobin A1c:  Lab Results  Component Value Date   HGBA1C 6.3 (H) 07/29/2020   MPG 134.11 07/29/2020    TSH  Recent Labs    09/26/21 0729  TSH 0.513    Lipid Panel No results found for: "CHOL", "TRIG", "HDL", "CHOLHDL", "VLDL", "LDLCALC", "LDLDIRECT"  Imaging: ECHOCARDIOGRAM COMPLETE  Result Date: 09/26/2021    ECHOCARDIOGRAM REPORT   Patient Name:   Jordan Hoffman Date of Exam: 09/26/2021 Medical Rec #:   [id number]   Height:       76.0 in Accession #:    [id number]  Weight:       190.0 lb Date of Birth:  Nov 19, 1928    BSA:          2.168 m Patient Age:    80 years    BP:           125/106 mmHg Patient Gender: M           HR:           98 bpm. Exam Location:  Inpatient Procedure: 2D Echo, Cardiac Doppler and Color Doppler Indications:     Acute Respiratory Distress; I48.1 Persistent atrial                  fibrillation; I50.41 Acute combined systolic (congestive) and                  diastolic (congestive) heart failure; R06.02 SOB  History:         Patient has no prior history of Echocardiogram examinations.                  CHF and Cardiomyopathy, Stroke, Arrythmias:Atrial Fibrillation,                  Signs/Symptoms:Dyspnea, Risk Factors:Hypertension, Diabetes and                  Dyslipidemia.; Medications:Beta Blockers.  Sonographer:     Harvie Junior Referring Phys:  [id number] Cleaster Corin OLLIS Diagnosing Phys: Rex Kras DO IMPRESSIONS  1. Left ventricular ejection fraction, by estimation, is 30 to 35%. The left ventricle has moderately decreased function. The left ventricle demonstrates global hypokinesis. There is moderate left ventricular hypertrophy. Left ventricular diastolic function could not be evaluated due to Afib.  2. Right ventricular systolic function is normal. The right ventricular size is mildly enlarged. Mildly increased right ventricular wall thickness. There is moderately elevated pulmonary artery systolic pressure. The estimated right ventricular systolic  pressure is 64.6 mmHg.  3. Left atrial size was mildly dilated.  4. Right atrial size was dilated.  5. The mitral valve is normal in structure. Mild mitral valve regurgitation. No evidence of mitral stenosis.  6. Tricuspid valve regurgitation is moderate.  7. The aortic valve is tricuspid. Aortic valve regurgitation is not visualized. Aortic valve sclerosis is present, with no evidence of aortic valve stenosis.  8. The inferior vena cava is  dilated in size with <50% respiratory variability,  suggesting right atrial pressure of 15 mmHg.  9. Rhythm strip during this exam demonstrates atrial fibrillation. Comparison(s): Prior study outside hospital 08/28/2021: LVEF 30-35%, severe global hypokinesis, mild LAE, moderate RAE, moderate MR, mild to moderate TR, IVC dilated, no pericardial effusion. FINDINGS  Left Ventricle: Left ventricular ejection fraction, by estimation, is 30 to 35%. The left ventricle has moderately decreased function. The left ventricle demonstrates global hypokinesis. The left ventricular internal cavity size was small. There is moderate left ventricular hypertrophy. Left ventricular diastolic function could not be evaluated due to Afib. Right Ventricle: The right ventricular size is mildly enlarged. Mildly increased right ventricular wall thickness. Right ventricular systolic function is normal. There is moderately elevated pulmonary artery systolic pressure. The tricuspid regurgitant velocity is 3.05 m/s, and with an assumed right atrial pressure of 15 mmHg, the estimated right ventricular systolic pressure is 02.7 mmHg. Left Atrium: Left atrial size was mildly dilated. Right Atrium: Right atrial size was dilated. Pericardium: There is no evidence of pericardial effusion. Mitral Valve: The mitral valve is normal in structure. Mild mitral valve regurgitation. No evidence of mitral valve stenosis. Tricuspid Valve: The tricuspid valve is grossly normal. Tricuspid valve regurgitation is moderate . No evidence of tricuspid stenosis. Aortic Valve: The aortic valve is tricuspid. Aortic valve regurgitation is not visualized. Aortic valve sclerosis is present, with no evidence of aortic valve stenosis. Aortic valve mean gradient measures 3.5 mmHg. Aortic valve peak gradient measures 6.2  mmHg. Aortic valve area, by VTI measures 2.71 cm. Pulmonic Valve: The pulmonic valve was grossly normal. Pulmonic valve regurgitation is not visualized. No  evidence of pulmonic stenosis. Aorta: The aortic root and ascending aorta are structurally normal, with no evidence of dilitation. Venous: The inferior vena cava is dilated in size with less than 50% respiratory variability, suggesting right atrial pressure of 15 mmHg. IAS/Shunts: No atrial level shunt detected by color flow Doppler. EKG: Rhythm strip during this exam demonstrates atrial fibrillation.  LEFT VENTRICLE PLAX 2D LVIDd:         4.50 cm      Diastology LVIDs:         3.80 cm      LV e' medial:    6.42 cm/s LV PW:         1.20 cm      LV E/e' medial:  14.0 LV IVS:        1.40 cm      LV e' lateral:   10.90 cm/s LVOT diam:     2.30 cm      LV E/e' lateral: 8.2 LV SV:         49 LV SV Index:   23 LVOT Area:     4.15 cm  LV Volumes (MOD) LV vol d, MOD A2C: 100.0 ml LV vol d, MOD A4C: 161.0 ml LV vol s, MOD A2C: 65.8 ml LV vol s, MOD A4C: 99.6 ml LV SV MOD A2C:     34.2 ml LV SV MOD A4C:     161.0 ml LV SV MOD BP:      49.2 ml RIGHT VENTRICLE RV Basal diam:  4.50 cm RV Mid diam:    3.20 cm RV S prime:     13.40 cm/s TAPSE (M-mode): 1.8 cm LEFT ATRIUM             Index        RIGHT ATRIUM           Index LA diam:  3.40 cm 1.57 cm/m   RA Area:     37.10 cm LA Vol (A2C):   96.8 ml 44.66 ml/m  RA Volume:   141.00 ml 65.05 ml/m LA Vol (A4C):   43.1 ml 19.88 ml/m LA Biplane Vol: 68.0 ml 31.37 ml/m  AORTIC VALVE                    PULMONIC VALVE AV Area (Vmax):    2.71 cm     PV Vmax:       0.86 m/s AV Area (Vmean):   2.57 cm     PV Peak grad:  2.9 mmHg AV Area (VTI):     2.71 cm AV Vmax:           124.25 cm/s AV Vmean:          87.775 cm/s AV VTI:            0.180 m AV Peak Grad:      6.2 mmHg AV Mean Grad:      3.5 mmHg LVOT Vmax:         81.03 cm/s LVOT Vmean:        54.300 cm/s LVOT VTI:          0.118 m LVOT/AV VTI ratio: 0.65  AORTA Ao Root diam: 4.10 cm Ao Asc diam:  3.40 cm MITRAL VALVE               TRICUSPID VALVE MV Area (PHT): 5.79 cm    TR Peak grad:   37.2 mmHg MV Decel Time: 131 msec     TR Vmax:        305.00 cm/s MR Peak grad: 70.2 mmHg MR Vmax:      419.00 cm/s  SHUNTS MV E velocity: 89.70 cm/s  Systemic VTI:  0.12 m MV A velocity: 26.30 cm/s  Systemic Diam: 2.30 cm MV E/A ratio:  3.41 Brookelynn Hamor DO Electronically signed by Rex Kras DO Signature Date/Time: 09/26/2021/12:13:41 PM    Final    DG Chest Port 1 View  Result Date: 09/26/2021 CLINICAL DATA:  Acute hypoxic respiratory failure with CHF. EXAM: PORTABLE CHEST 1 VIEW COMPARISON:  Portable chest yesterday at 7:53 a.m. FINDINGS: 5:13 a.m.  There is moderate to severe cardiomegaly. There is improvement in central vascular prominence and mild basilar interstitial edema with improvement with regression of edema from the upper lung fields and centrally. There are unchanged small pleural effusions and patchy dense consolidation in the left lower lobe. Perihilar ground-glass opacities are improved and probably due to resolving ground-glass edema. The mediastinum is stable with aortic tortuosity, atherosclerosis ectasia. There is advanced thoracic spondylosis.  Advanced shoulder DJD. IMPRESSION: 1. Improving vascular congestion and interstitial edema. 2. Small pleural effusions are unchanged. 3. Left lower lobe patchy dense consolidation is unchanged. Electronically Signed   By: Telford Nab M.D.   On: 09/26/2021 06:51   CT Head Wo Contrast  Result Date: 09/25/2021 CLINICAL DATA:  Altered mental status.  Nontraumatic. EXAM: CT HEAD WITHOUT CONTRAST TECHNIQUE: Contiguous axial images were obtained from the base of the skull through the vertex without intravenous contrast. RADIATION DOSE REDUCTION: This exam was performed according to the departmental dose-optimization program which includes automated exposure control, adjustment of the mA and/or kV according to patient size and/or use of iterative reconstruction technique. COMPARISON:  08/30/2021 FINDINGS: Brain: No evidence of acute infarction, hemorrhage, hydrocephalus, extra-axial  collection or mass lesion/mass effect.Encephalomalacia within the left parietal subcortical white matter and  cortex compatible with recent posterior left MCA territory infarct, image 22/3. Remote left basal ganglia lacunar infarct noted. There is mild diffuse low-attenuation within the subcortical and periventricular white matter compatible with chronic microvascular disease. Prominence of the sulci and ventricles compatible with brain atrophy. Vascular: No hyperdense vessel or unexpected calcification. Skull: Normal. Negative for fracture or focal lesion. Sinuses/Orbits: Chronic mucoperiosteal thickening involving the left maxillary sinus noted. Mastoid air cells are clear. Other: None. IMPRESSION: 1. No acute intracranial abnormalities. 2. Encephalomalacia within the left parietal subcortical white matter and cortex compatible with recent posterior left MCA territory infarct. 3. Chronic small vessel ischemic disease and brain atrophy. Electronically Signed   By: Kerby Moors M.D.   On: 09/25/2021 10:42   DG Chest 1 View  Result Date: 09/25/2021 CLINICAL DATA:  Altered mental status, short of breath EXAM: CHEST  1 VIEW COMPARISON:  Prior chest x-ray 08/26/2021 FINDINGS: Similar degree of marked cardiomegaly. Interval increase in diffuse pulmonary vascular congestion and interstitial airspace opacities with Kerley B-lines peripherally. Findings are consistent with interstitial pulmonary edema. No large pneumothorax or pleural effusion. No acute osseous abnormality. IMPRESSION: Pulmonary edema and cardiomegaly consistent with CHF. Electronically Signed   By: Jacqulynn Cadet M.D.   On: 09/25/2021 08:00    RADIOLOGY: CARE EVERYWHERE Chest x-ray 08/26/2021: Mild to moderate pulmonary edema with trace effusions and bibasilar atelectasis. Unchanged cardiomegaly.   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 No  evidence of new infarction or hemorrhage. No significant change compared to prior same day examination.   Carotid duplex 08/30/2021 [facility]: Right Findings No significant atherosclerosis or stenosis of the right common carotid artery. The Doppler flow velocities within the right external carotid artery are within normal limits. 1-39% stenosis of the right internal carotid artery/bifurcation. Plaque is calcified . Antegrade flow is noted in the right vertebral artery.   Left Findings No significant atherosclerosis or stenosis of the left common carotid artery. The Doppler flow velocities within the left external carotid artery are within normal limits. 1-39% stenosis of the left internal carotid artery. Plaque is calcified . Antegrade flow is noted in the left vertebral artery.   MRI head without contrast 08/29/2021: Motion degraded.   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   Chronic microvascular ischemic changes.   No proximal intracranial vessel occlusion.   CARDIAC DATABASE: PER CARE EVERYWHERE:  Echo 08/28/2021 Per report: LVEF 30-35%, severe global hypokinesis, mild LAE, moderate RAE, normal right ventricular size and function, moderate MR, mild to moderate TR, IVC severely dilated, no pericardial effusion   PYP study 05/06/2021: H/CL ratio of 1.3. Planar Grade 1. SPECT Grade 2. Findings are overall equivocal for TTR amyloidosis.    ECHO: 01/19/2020 Interpretation Summary Compared to prior study, there is no significant change.  Normal LV size. Moderate LVH. LVEF>55%. Normal RV size and function. Mild RA enlargement. Mild TR. Mild MR.The estimated RVSP= 108mmHg Trivial PR. No AR. Borderline aortic root enlargement. No pericardial effusion.   Cardiac MRI 11/09/2020: 1. Normal-sized left ventricle with severe left ventricular systolic dysfunction with a calculated ejection fraction of 29%.    2. Mild  left ventricular hypertrophy with areas of mid myocardial delayed enhancement as described above which could be related to sequelae of prior myocarditis or sarcoidosis.    3. Mild to moderately dilated right ventricle with normal right ventricular systolic function.    4. Mildly  dilated left atrium and moderately dilated right atrium.    5. Mild tricuspid regurgitation.  Cardiac Cath (08/18/15) 1. Diffuse dense calcific plaque ascending aorta without aneurysmal changes  or dissection  2. Right dominant coronary system with non-obstructive 40% proximal LAD,  otherwise normal coronaries   Exercise Stress Test (07/2015) 1. Positive study (1.6mm) during Stage II Bruce protocol. No chronotropic  incompetence. 2. Normal heart rate and BP response to exercise. Of note, in early recovery,  pt developed narrow complex tachycardia consistent with SVT (16 beats, HR  170s) that was asymptomatic. 3. Exercise terminated due to fatigue. 4. Study done to assess for chronotropic incompetence.   CURRENT HOSPITALIZATION: EKG: 09/25/2021: Atrial fibrillation, 110 bpm, LVH per voltage criteria, ST-T changes likely secondary to repolarization abnormality but lateral ischemia cannot be ruled out, rare PVCs   Echocardiogram: 09/26/2021: 1. Left ventricular ejection fraction, by estimation, is 30 to 35%. The  left ventricle has moderately decreased function. The left ventricle  demonstrates global hypokinesis. There is moderate left ventricular  hypertrophy. Left ventricular diastolic  function could not be evaluated due to Afib.   2. Right ventricular systolic function is normal. The right ventricular  size is mildly enlarged. Mildly increased right ventricular wall  thickness. There is moderately elevated pulmonary artery systolic  pressure. The estimated right ventricular systolic   pressure is 81.4 mmHg.   3. Left atrial size was mildly dilated.   4. Right atrial size was dilated.   5. The mitral  valve is normal in structure. Mild mitral valve  regurgitation. No evidence of mitral stenosis.   6. Tricuspid valve regurgitation is moderate.   7. The aortic valve is tricuspid. Aortic valve regurgitation is not  visualized. Aortic valve sclerosis is present, with no evidence of aortic  valve stenosis.   8. The inferior vena cava is dilated in size with <50% respiratory  variability, suggesting right atrial pressure of 15 mmHg.   9. Rhythm strip during this exam demonstrates atrial fibrillation.   Comparison(s): Prior study outside hospital 08/28/2021: LVEF 30-35%,  severe global hypokinesis, mild LAE, moderate RAE, moderate MR, mild to  moderate TR, IVC dilated, no pericardial effusion.   Scheduled Meds:  apixaban  2.5 mg Oral BID   arformoterol  15 mcg Nebulization BID   And   umeclidinium bromide  1 puff Inhalation Daily   vitamin C  500 mg Oral Daily   budesonide  1 mg Nebulization BID   Chlorhexidine Gluconate Cloth  6 each Topical Daily   dapagliflozin propanediol  10 mg Oral Daily   fluticasone  2 spray Each Nare Daily   folic acid  1 mg Intravenous Daily   [START ON 09/27/2021] furosemide  40 mg Intravenous Daily   guaiFENesin  600 mg Oral BID   insulin aspart  0-15 Units Subcutaneous Q4H   ipratropium-albuterol  3 mL Nebulization Once   latanoprost  1 drop Both Eyes QHS   methylPREDNISolone (SOLU-MEDROL) injection  90 mg Intravenous Q12H   Followed by   Derrill Memo ON 09/28/2021] predniSONE  50 mg Oral Daily   metoprolol tartrate  37.5 mg Oral BID   pantoprazole  40 mg Oral Daily   spironolactone  12.5 mg Oral Daily   thiamine  100 mg Oral Daily    Continuous Infusions:   PRN Meds: acetaminophen **OR** acetaminophen, acetaminophen, albuterol, docusate sodium, guaiFENesin-dextromethorphan, hydrALAZINE, ondansetron (ZOFRAN) IV, mouth rinse, polyethylene glycol   IMPRESSION & RECOMMENDATIONS: Jordan Hoffman is a 88 y.o. African-American male whose  past medical history and  cardiac risk factors include: Persistent atrial fibrillation (last DCCV 12/16/2015), HFrEF, COPD, history of syncope, nonobstructive CAD, type 2 diabetes, hypertension, OSA on CPAP.  Impression: Acute hypoxic respiratory failure-multifactorial (acute COVID-19 infection, underlying COPD/acute HFrEF exacerbation, cardiomyopathy, A-fib with RVR) Acute HFrEF, stage C, NYHA class II/III Elevated high sensitive troponin is likely secondary to supply demand ischemia Cardiomyopathy Persistent atrial fibrillation Nonobstructive CAD History of CVA Non-insulin-dependent diabetes mellitus type 2 Hypertension Hyperlipidemia/statin intolerance OSA on CPAP  Plan: Acute HFrEF, stage C, NYHA class II/III Improving. No longer requiring BiPAP support. I's and O's are not accurate due to incontinence. We will reduce Lasix 40 mg IV push twice daily to daily. Continue Farxiga 10 mg p.o. daily. Start spironolactone 12.5 mg p.o. every morning Focus on diuresis for now. We will slowly uptitrate GDMT as his hemodynamics and laboratory evaluation Continue cardiac telemetry. Strict I's and O's and daily weights  Elevated troponins: Secondary to supply demand ischemia due to acute hypoxic respiratory failure, A-fib with RVR, underlying cardiomyopathy, acute HFrEF. High sensitive troponins not suggestive of ACS. Denies anginal discomfort this morning. Given the fact that he is not having anginal discomfort and acute COVID-19 infection we will hold off on ischemic work-up during this hospitalization.  Have asked him to reconsider this with his outpatient cardiologist upon discharge.  Persistent atrial fibrillation: History of cardioversion in the past as well as trial of multiple antiarrhythmics per Care Everywhere. We will focus on rate control strategy as recommended by his outpatient electrophysiologist. Currently on metoprolol tartrate 37.5 mg p.o. daily. Will increase metoprolol tartrate to 25 mg p.o.  twice daily. Currently on oral anticoagulation for thromboembolic prophylaxis.  Does not endorse evidence of bleeding. CHA2DS2-VASc SCORE is 7 which correlates to 9.6% risk of stroke per year (CHF, HTN, age greater than 99, DM, stroke).   Nonobstructive CAD: Asymptomatic. Last ischemic work-up in 2017 per EMR. Since he is on anticoagulation for A-fib, hold aspirin 81 mg p.o. daily to reduce the chances of bleeding. He has been intolerant to statin therapy in the past. Medications as discussed above  COVID-19 infection: Management per primary team.  Acute hypoxic respiratory failure: Present on admission likely secondary to multifactorial etiologies (acute COVID-19 infection, acute exacerbation of COPD/acute HFrEF, cardiomyopathy, A-fib with RVR) CHF management as discussed above. COVID management per primary team. A-fib with RVR-ventricular rate better controlled  Plan of care discussed with attending physician and nursing staff.  Patient's questions and concerns were addressed to his satisfaction. He voices understanding of the instructions provided during this encounter.   This note was created using a voice recognition software as a result there may be grammatical errors inadvertently enclosed that do not reflect the nature of this encounter. Every attempt is made to correct such errors.  Total time spent: 35 minutes  Mechele Claude [facility]  Pager: [phone] Office: [phone] 09/26/2021, 6:21 PM

## 2021-09-27 ENCOUNTER — Inpatient Hospital Stay (HOSPITAL_COMMUNITY): Payer: No Typology Code available for payment source

## 2021-09-27 DIAGNOSIS — I4819 Other persistent atrial fibrillation: Secondary | ICD-10-CM | POA: Diagnosis not present

## 2021-09-27 DIAGNOSIS — R778 Other specified abnormalities of plasma proteins: Secondary | ICD-10-CM | POA: Diagnosis not present

## 2021-09-27 DIAGNOSIS — I2510 Atherosclerotic heart disease of native coronary artery without angina pectoris: Secondary | ICD-10-CM | POA: Diagnosis not present

## 2021-09-27 DIAGNOSIS — E440 Moderate protein-calorie malnutrition: Secondary | ICD-10-CM | POA: Insufficient documentation

## 2021-09-27 DIAGNOSIS — I5021 Acute systolic (congestive) heart failure: Secondary | ICD-10-CM | POA: Diagnosis not present

## 2021-09-27 LAB — CBC WITH DIFFERENTIAL/PLATELET
Abs Immature Granulocytes: 0.05 10*3/uL (ref 0.00–0.07)
Basophils Absolute: 0 10*3/uL (ref 0.0–0.1)
Basophils Relative: 0 %
Eosinophils Absolute: 0 10*3/uL (ref 0.0–0.5)
Eosinophils Relative: 0 %
HCT: 40.8 % (ref 39.0–52.0)
Hemoglobin: 13 g/dL (ref 13.0–17.0)
Immature Granulocytes: 0 %
Lymphocytes Relative: 4 %
Lymphs Abs: 0.5 10*3/uL — ABNORMAL LOW (ref 0.7–4.0)
MCH: 28.3 pg (ref 26.0–34.0)
MCHC: 31.9 g/dL (ref 30.0–36.0)
MCV: 88.7 fL (ref 80.0–100.0)
Monocytes Absolute: 0.1 10*3/uL (ref 0.1–1.0)
Monocytes Relative: 1 %
Neutro Abs: 11 10*3/uL — ABNORMAL HIGH (ref 1.7–7.7)
Neutrophils Relative %: 95 %
Platelets: 256 10*3/uL (ref 150–400)
RBC: 4.6 MIL/uL (ref 4.22–5.81)
RDW: 14.9 % (ref 11.5–15.5)
WBC: 11.6 10*3/uL — ABNORMAL HIGH (ref 4.0–10.5)
nRBC: 0 % (ref 0.0–0.2)

## 2021-09-27 LAB — LACTIC ACID, PLASMA: Lactic Acid, Venous: 3.1 mmol/L (ref 0.5–1.9)

## 2021-09-27 LAB — COMPREHENSIVE METABOLIC PANEL
ALT: 12 U/L (ref 0–44)
AST: 20 U/L (ref 15–41)
Albumin: 3.1 g/dL — ABNORMAL LOW (ref 3.5–5.0)
Alkaline Phosphatase: 105 U/L (ref 38–126)
Anion gap: 9 (ref 5–15)
BUN: 38 mg/dL — ABNORMAL HIGH (ref 8–23)
CO2: 28 mmol/L (ref 22–32)
Calcium: 10.3 mg/dL (ref 8.9–10.3)
Chloride: 104 mmol/L (ref 98–111)
Creatinine, Ser: 1.67 mg/dL — ABNORMAL HIGH (ref 0.61–1.24)
GFR, Estimated: 38 mL/min — ABNORMAL LOW (ref >60)
Glucose, Bld: 148 mg/dL — ABNORMAL HIGH (ref 70–99)
Potassium: 4.8 mmol/L (ref 3.5–5.1)
Sodium: 141 mmol/L (ref 135–145)
Total Bilirubin: 1.3 mg/dL — ABNORMAL HIGH (ref 0.3–1.2)
Total Protein: 7.3 g/dL (ref 6.5–8.1)

## 2021-09-27 LAB — D-DIMER, QUANTITATIVE: D-Dimer, Quant: 0.94 ug/mL-FEU — ABNORMAL HIGH (ref 0.00–0.50)

## 2021-09-27 LAB — FIBRINOGEN: Fibrinogen: 479 mg/dL — ABNORMAL HIGH (ref 210–475)

## 2021-09-27 LAB — GLUCOSE, CAPILLARY
Glucose-Capillary: 127 mg/dL — ABNORMAL HIGH (ref 70–99)
Glucose-Capillary: 164 mg/dL — ABNORMAL HIGH (ref 70–99)
Glucose-Capillary: 175 mg/dL — ABNORMAL HIGH (ref 70–99)
Glucose-Capillary: 201 mg/dL — ABNORMAL HIGH (ref 70–99)
Glucose-Capillary: 201 mg/dL — ABNORMAL HIGH (ref 70–99)
Glucose-Capillary: 93 mg/dL (ref 70–99)

## 2021-09-27 LAB — FERRITIN: Ferritin: 76 ng/mL (ref 24–336)

## 2021-09-27 LAB — MAGNESIUM: Magnesium: 2 mg/dL (ref 1.7–2.4)

## 2021-09-27 LAB — C-REACTIVE PROTEIN: CRP: 1.7 mg/dL — ABNORMAL HIGH (ref <1.0)

## 2021-09-27 LAB — SEDIMENTATION RATE: Sed Rate: 26 mm/hr — ABNORMAL HIGH (ref 0–16)

## 2021-09-27 LAB — PHOSPHORUS: Phosphorus: 3.9 mg/dL (ref 2.5–4.6)

## 2021-09-27 MED ORDER — FOLIC ACID 1 MG PO TABS
1.0000 mg | ORAL_TABLET | Freq: Every day | ORAL | Status: DC
  Administered 2021-09-27 – 2021-09-28 (×2): 1 mg via ORAL
  Filled 2021-09-27 (×2): qty 1

## 2021-09-27 NOTE — Progress Notes (Signed)
Progress Note  Patient Name: Jordan Hoffman Date of Encounter: 09/27/2021  Attending physician: Kerney Elbe, DO Primary care provider: [place]  Subjective: Jordan Hoffman is a 88 y.o. male who was seen and examined at bedside  Denies chest pain. Much more awake and alert. Shortness of breath is improved approximately by 60-70%, per patient Still requiring nasal cannula oxygen. Daughter at bedside Case discussed and reviewed with his nurse.  Objective: Vital Signs in the last 24 hours: Temp:  [97.5 F (36.4 C)-98.4 F (36.9 C)] 98.1 F (36.7 C) (09/12 0920) Pulse Rate:  [84-101] 87 (09/12 0536) Resp:  [19-29] 20 (09/12 0920) BP: (118-151)/(76-91) 151/91 (09/12 0536) SpO2:  [95 %-100 %] 100 % (09/12 0850) Weight:  [82.5 kg-84.4 kg] 84.4 kg (09/12 0500)  Intake/Output:  Intake/Output Summary (Last 24 hours) at 09/27/2021 1248 Last data filed at 09/27/2021 0544 Gross per 24 hour  Intake --  Output 1200 ml  Net -1200 ml    Net IO Since Admission: -1,365 mL [09/27/21 1248]  Weights:  Filed Weights   09/26/21 0450 09/26/21 1358 09/27/21 0500  Weight: 86.2 kg 82.5 kg 84.4 kg    Telemetry: Personally reviewed.  Atrial fibrillation with ectopy  Physical examination: PHYSICAL EXAM:    09/27/2021    5:36 AM 09/27/2021    5:00 AM 09/27/2021    1:00 AM  Vitals with BMI  Weight  186 lbs 1 oz   BMI  47.82   Systolic 956  213  Diastolic 91  83  Pulse 87  92    CONSTITUTIONAL: Appears older than stated age, hemodynamically stable, no acute distress.   SKIN: Skin is warm and dry. No rash noted. No cyanosis. No pallor. No jaundice HEAD: Normocephalic and atraumatic.  Nasal cannula oxygen EYES: No scleral icterus MOUTH/THROAT: Moist oral membranes.  NECK: No JVD present. No thyromegaly noted. No carotid bruits  CHEST Normal respiratory effort. No intercostal retractions  LUNGS: Decreased breath sounds bilaterally.  No rales, rhonchi's, or expiratory  wheezes.   CARDIOVASCULAR: Irregularly irregular, variable S1-S2, no murmurs rubs or gallops appreciated ABDOMINAL: Soft, nontender, nondistended, positive bowel sounds in all 4 quadrants, no apparent ascites.  EXTREMITIES: No pitting edema, warm to touch,  HEMATOLOGIC: No significant bruising NEUROLOGIC: Oriented to person, place, and time. Nonfocal. Normal muscle tone.  PSYCHIATRIC: Normal mood and affect. Normal behavior. Cooperative  Lab Results: Hematology Recent Labs  Lab 09/25/21 0745 09/25/21 1018 09/26/21 0729 09/27/21 0508  WBC 6.6  --  7.6 11.6*  RBC 4.38  --  4.49 4.60  HGB 12.4* 13.6 12.5* 13.0  HCT 39.5 40.0 40.0 40.8  MCV 90.2  --  89.1 88.7  MCH 28.3  --  27.8 28.3  MCHC 31.4  --  31.3 31.9  RDW 14.9  --  14.9 14.9  PLT 256  --  246 256    Chemistry Recent Labs  Lab 09/25/21 0745 09/25/21 1018 09/26/21 0729 09/27/21 0508  NA 141 140 141 141  K 4.4 4.2 4.5 4.8  CL 106  --  105 104  CO2 25  --  26 28  GLUCOSE 178*  --  149* 148*  BUN 19  --  29* 38*  CREATININE 1.33*  --  1.52* 1.67*  CALCIUM 10.2  --  10.4* 10.3  PROT 7.5  --  7.2 7.3  ALBUMIN 3.2*  --  3.2* 3.1*  AST 22  --  17 20  ALT 13  --  13 12  ALKPHOS 121  --  110 105  BILITOT 1.8*  --  1.4* 1.3*  GFRNONAA 50*  --  43* 38*  ANIONGAP 10  --  10 9     Cardiac Enzymes: Cardiac Panel (last 3 results) Recent Labs    09/25/21 1227 09/25/21 1449 09/25/21 1625  TROPONINIHS 75* 84* 85*    BNP (last 3 results) Recent Labs    09/25/21 0745  BNP 1,146.4*    ProBNP (last 3 results) No results for input(s): "PROBNP" in the last 8760 hours.   DDimer  Recent Labs  Lab 09/25/21 1227 09/27/21 0508  DDIMER 1.00* 0.94*     Hemoglobin A1c:  Lab Results  Component Value Date   HGBA1C 6.3 (H) 07/29/2020   MPG 134.11 07/29/2020    TSH  Recent Labs    09/26/21 0729  TSH 0.513    Lipid Panel No results found for: "CHOL", "TRIG", "HDL", "CHOLHDL", "VLDL", "LDLCALC",  "LDLDIRECT"  Imaging: DG CHEST PORT 1 VIEW  Result Date: 09/27/2021 CLINICAL DATA:  Shortness of breath EXAM: PORTABLE CHEST 1 VIEW COMPARISON:  Portable exam 0528 hours compared to 09/26/2021 FINDINGS: Enlargement of cardiac silhouette with pulmonary vascular congestion. Persistent atelectasis versus consolidation LEFT lower lobe. Subsegmental atelectasis RIGHT base. Minimal persistent interstitial prominence likely reflecting mild residual pulmonary edema. Minimal LEFT pleural effusion without pneumothorax. Advanced RIGHT glenohumeral degenerative changes. IMPRESSION: Enlargement of cardiac silhouette with pulmonary vascular congestion and minimal residual pulmonary edema. RIGHT basilar atelectasis with persistent atelectasis versus consolidation and question small pleural effusion at LEFT base. Electronically Signed   By: Lavonia Dana M.D.   On: 09/27/2021 08:24   ECHOCARDIOGRAM COMPLETE  Result Date: 09/26/2021    ECHOCARDIOGRAM REPORT   Patient Name:   Jordan Hoffman Date of Exam: 09/26/2021 Medical Rec #:  [id number]   Height:       76.0 in Accession #:    [id number]  Weight:       190.0 lb Date of Birth:  November 01, 1928    BSA:          2.168 m Patient Age:    27 years    BP:           125/106 mmHg Patient Gender: M           HR:           98 bpm. Exam Location:  Inpatient Procedure: 2D Echo, Cardiac Doppler and Color Doppler Indications:     Acute Respiratory Distress; I48.1 Persistent atrial                  fibrillation; I50.41 Acute combined systolic (congestive) and                  diastolic (congestive) heart failure; R06.02 SOB  History:         Patient has no prior history of Echocardiogram examinations.                  CHF and Cardiomyopathy, Stroke, Arrythmias:Atrial Fibrillation,                  Signs/Symptoms:Dyspnea, Risk Factors:Hypertension, Diabetes and                  Dyslipidemia.; Medications:Beta Blockers.  Sonographer:     Harvie Junior Referring Phys:  [id number] Cleaster Corin OLLIS Diagnosing  Phys: Rex Kras DO IMPRESSIONS  1. Left ventricular ejection fraction, by estimation, is 30 to 35%. The left ventricle  has moderately decreased function. The left ventricle demonstrates global hypokinesis. There is moderate left ventricular hypertrophy. Left ventricular diastolic function could not be evaluated due to Afib.  2. Right ventricular systolic function is normal. The right ventricular size is mildly enlarged. Mildly increased right ventricular wall thickness. There is moderately elevated pulmonary artery systolic pressure. The estimated right ventricular systolic  pressure is 91.6 mmHg.  3. Left atrial size was mildly dilated.  4. Right atrial size was dilated.  5. The mitral valve is normal in structure. Mild mitral valve regurgitation. No evidence of mitral stenosis.  6. Tricuspid valve regurgitation is moderate.  7. The aortic valve is tricuspid. Aortic valve regurgitation is not visualized. Aortic valve sclerosis is present, with no evidence of aortic valve stenosis.  8. The inferior vena cava is dilated in size with <50% respiratory variability, suggesting right atrial pressure of 15 mmHg.  9. Rhythm strip during this exam demonstrates atrial fibrillation. Comparison(s): Prior study outside hospital 08/28/2021: LVEF 30-35%, severe global hypokinesis, mild LAE, moderate RAE, moderate MR, mild to moderate TR, IVC dilated, no pericardial effusion. FINDINGS  Left Ventricle: Left ventricular ejection fraction, by estimation, is 30 to 35%. The left ventricle has moderately decreased function. The left ventricle demonstrates global hypokinesis. The left ventricular internal cavity size was small. There is moderate left ventricular hypertrophy. Left ventricular diastolic function could not be evaluated due to Afib. Right Ventricle: The right ventricular size is mildly enlarged. Mildly increased right ventricular wall thickness. Right ventricular systolic function is normal. There is moderately elevated  pulmonary artery systolic pressure. The tricuspid regurgitant velocity is 3.05 m/s, and with an assumed right atrial pressure of 15 mmHg, the estimated right ventricular systolic pressure is 94.5 mmHg. Left Atrium: Left atrial size was mildly dilated. Right Atrium: Right atrial size was dilated. Pericardium: There is no evidence of pericardial effusion. Mitral Valve: The mitral valve is normal in structure. Mild mitral valve regurgitation. No evidence of mitral valve stenosis. Tricuspid Valve: The tricuspid valve is grossly normal. Tricuspid valve regurgitation is moderate . No evidence of tricuspid stenosis. Aortic Valve: The aortic valve is tricuspid. Aortic valve regurgitation is not visualized. Aortic valve sclerosis is present, with no evidence of aortic valve stenosis. Aortic valve mean gradient measures 3.5 mmHg. Aortic valve peak gradient measures 6.2  mmHg. Aortic valve area, by VTI measures 2.71 cm. Pulmonic Valve: The pulmonic valve was grossly normal. Pulmonic valve regurgitation is not visualized. No evidence of pulmonic stenosis. Aorta: The aortic root and ascending aorta are structurally normal, with no evidence of dilitation. Venous: The inferior vena cava is dilated in size with less than 50% respiratory variability, suggesting right atrial pressure of 15 mmHg. IAS/Shunts: No atrial level shunt detected by color flow Doppler. EKG: Rhythm strip during this exam demonstrates atrial fibrillation.  LEFT VENTRICLE PLAX 2D LVIDd:         4.50 cm      Diastology LVIDs:         3.80 cm      LV e' medial:    6.42 cm/s LV PW:         1.20 cm      LV E/e' medial:  14.0 LV IVS:        1.40 cm      LV e' lateral:   10.90 cm/s LVOT diam:     2.30 cm      LV E/e' lateral: 8.2 LV SV:         49 LV  SV Index:   23 LVOT Area:     4.15 cm  LV Volumes (MOD) LV vol d, MOD A2C: 100.0 ml LV vol d, MOD A4C: 161.0 ml LV vol s, MOD A2C: 65.8 ml LV vol s, MOD A4C: 99.6 ml LV SV MOD A2C:     34.2 ml LV SV MOD A4C:     161.0  ml LV SV MOD BP:      49.2 ml RIGHT VENTRICLE RV Basal diam:  4.50 cm RV Mid diam:    3.20 cm RV S prime:     13.40 cm/s TAPSE (M-mode): 1.8 cm LEFT ATRIUM             Index        RIGHT ATRIUM           Index LA diam:        3.40 cm 1.57 cm/m   RA Area:     37.10 cm LA Vol (A2C):   96.8 ml 44.66 ml/m  RA Volume:   141.00 ml 65.05 ml/m LA Vol (A4C):   43.1 ml 19.88 ml/m LA Biplane Vol: 68.0 ml 31.37 ml/m  AORTIC VALVE                    PULMONIC VALVE AV Area (Vmax):    2.71 cm     PV Vmax:       0.86 m/s AV Area (Vmean):   2.57 cm     PV Peak grad:  2.9 mmHg AV Area (VTI):     2.71 cm AV Vmax:           124.25 cm/s AV Vmean:          87.775 cm/s AV VTI:            0.180 m AV Peak Grad:      6.2 mmHg AV Mean Grad:      3.5 mmHg LVOT Vmax:         81.03 cm/s LVOT Vmean:        54.300 cm/s LVOT VTI:          0.118 m LVOT/AV VTI ratio: 0.65  AORTA Ao Root diam: 4.10 cm Ao Asc diam:  3.40 cm MITRAL VALVE               TRICUSPID VALVE MV Area (PHT): 5.79 cm    TR Peak grad:   37.2 mmHg MV Decel Time: 131 msec    TR Vmax:        305.00 cm/s MR Peak grad: 70.2 mmHg MR Vmax:      419.00 cm/s  SHUNTS MV E velocity: 89.70 cm/s  Systemic VTI:  0.12 m MV A velocity: 26.30 cm/s  Systemic Diam: 2.30 cm MV E/A ratio:  3.41 Faraz Ponciano DO Electronically signed by Rex Kras DO Signature Date/Time: 09/26/2021/12:13:41 PM    Final    DG Chest Port 1 View  Result Date: 09/26/2021 CLINICAL DATA:  Acute hypoxic respiratory failure with CHF. EXAM: PORTABLE CHEST 1 VIEW COMPARISON:  Portable chest yesterday at 7:53 a.m. FINDINGS: 5:13 a.m.  There is moderate to severe cardiomegaly. There is improvement in central vascular prominence and mild basilar interstitial edema with improvement with regression of edema from the upper lung fields and centrally. There are unchanged small pleural effusions and patchy dense consolidation in the left lower lobe. Perihilar ground-glass opacities are improved and probably due to resolving  ground-glass edema. The mediastinum is stable with aortic tortuosity,  atherosclerosis ectasia. There is advanced thoracic spondylosis.  Advanced shoulder DJD. IMPRESSION: 1. Improving vascular congestion and interstitial edema. 2. Small pleural effusions are unchanged. 3. Left lower lobe patchy dense consolidation is unchanged. Electronically Signed   By: Telford Nab M.D.   On: 09/26/2021 06:51    RADIOLOGY: CARE EVERYWHERE Chest x-ray 08/26/2021: Mild to moderate pulmonary edema with trace effusions and bibasilar atelectasis. Unchanged cardiomegaly.   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 No evidence of new infarction or hemorrhage. No significant change compared to prior same day examination.   Carotid duplex 08/30/2021 [facility]: Right Findings No significant atherosclerosis or stenosis of the right common carotid artery. The Doppler flow velocities within the right external carotid artery are within normal limits. 1-39% stenosis of the right internal carotid artery/bifurcation. Plaque is calcified . Antegrade flow is noted in the right vertebral artery.   Left Findings No significant atherosclerosis or stenosis of the left common carotid artery. The Doppler flow velocities within the left external carotid artery are within normal limits. 1-39% stenosis of the left internal carotid artery. Plaque is calcified . Antegrade flow is noted in the left vertebral artery.   MRI head without contrast 08/29/2021: Motion degraded.   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   Chronic microvascular ischemic changes.   No proximal intracranial vessel occlusion.   CARDIAC DATABASE: PER CARE EVERYWHERE:  Echo 08/28/2021 Per report: LVEF 30-35%, severe global hypokinesis, mild LAE, moderate RAE, normal right ventricular size and  function, moderate MR, mild to moderate TR, IVC severely dilated, no pericardial effusion   PYP study 05/06/2021: H/CL ratio of 1.3. Planar Grade 1. SPECT Grade 2. Findings are overall equivocal for TTR amyloidosis.    ECHO: 01/19/2020 Interpretation Summary Compared to prior study, there is no significant change.  Normal LV size. Moderate LVH. LVEF>55%. Normal RV size and function. Mild RA enlargement. Mild TR. Mild MR.The estimated RVSP= 5mmHg Trivial PR. No AR. Borderline aortic root enlargement. No pericardial effusion.   Cardiac MRI 11/09/2020: 1. Normal-sized left ventricle with severe left ventricular systolic dysfunction with a calculated ejection fraction of 29%.    2. Mild left ventricular hypertrophy with areas of mid myocardial delayed enhancement as described above which could be related to sequelae of prior myocarditis or sarcoidosis.    3. Mild to moderately dilated right ventricle with normal right ventricular systolic function.    4. Mildly dilated left atrium and moderately dilated right atrium.    5. Mild tricuspid regurgitation.  Cardiac Cath (08/18/15) 1. Diffuse dense calcific plaque ascending aorta without aneurysmal changes  or dissection  2. Right dominant coronary system with non-obstructive 40% proximal LAD,  otherwise normal coronaries   Exercise Stress Test (07/2015) 1. Positive study (1.25mm) during Stage II Bruce protocol. No chronotropic  incompetence. 2. Normal heart rate and BP response to exercise. Of note, in early recovery,  pt developed narrow complex tachycardia consistent with SVT (16 beats, HR  170s) that was asymptomatic. 3. Exercise terminated due to fatigue. 4. Study done to assess for chronotropic incompetence.   CURRENT HOSPITALIZATION: EKG: 09/25/2021: Atrial fibrillation, 110 bpm, LVH per voltage criteria, ST-T changes likely secondary to repolarization abnormality but lateral ischemia cannot be ruled out, rare PVCs    Echocardiogram: 09/26/2021: 1. Left ventricular ejection fraction, by estimation, is 30 to 35%. The  left ventricle has moderately decreased function. The left ventricle  demonstrates global hypokinesis. There is moderate left ventricular  hypertrophy. Left ventricular diastolic  function could not be evaluated due to Afib.   2. Right ventricular systolic function is normal. The right ventricular  size is mildly enlarged. Mildly increased right ventricular wall  thickness. There is moderately elevated pulmonary artery systolic  pressure. The estimated right ventricular systolic   pressure is 70.3 mmHg.   3. Left atrial size was mildly dilated.   4. Right atrial size was dilated.   5. The mitral valve is normal in structure. Mild mitral valve  regurgitation. No evidence of mitral stenosis.   6. Tricuspid valve regurgitation is moderate.   7. The aortic valve is tricuspid. Aortic valve regurgitation is not  visualized. Aortic valve sclerosis is present, with no evidence of aortic  valve stenosis.   8. The inferior vena cava is dilated in size with <50% respiratory  variability, suggesting right atrial pressure of 15 mmHg.   9. Rhythm strip during this exam demonstrates atrial fibrillation.   Comparison(s): Prior study outside hospital 08/28/2021: LVEF 30-35%,  severe global hypokinesis, mild LAE, moderate RAE, moderate MR, mild to  moderate TR, IVC dilated, no pericardial effusion.   Scheduled Meds:  apixaban  2.5 mg Oral BID   arformoterol  15 mcg Nebulization BID   And   umeclidinium bromide  1 puff Inhalation Daily   vitamin C  500 mg Oral Daily   budesonide  1 mg Nebulization BID   Chlorhexidine Gluconate Cloth  6 each Topical Daily   dapagliflozin propanediol  10 mg Oral Daily   feeding supplement  237 mL Oral BID BM   fluticasone  2 spray Each Nare Daily   folic acid  1 mg Oral Daily   guaiFENesin  600 mg Oral BID   insulin aspart  0-15 Units Subcutaneous Q4H    ipratropium-albuterol  3 mL Nebulization Once   latanoprost  1 drop Both Eyes QHS   methylPREDNISolone (SOLU-MEDROL) injection  90 mg Intravenous Q12H   Followed by   Derrill Memo ON 09/28/2021] predniSONE  50 mg Oral Daily   metoprolol tartrate  50 mg Oral BID   pantoprazole  40 mg Oral Daily   spironolactone  12.5 mg Oral Daily   thiamine  100 mg Oral Daily    Continuous Infusions:   PRN Meds: acetaminophen **OR** acetaminophen, acetaminophen, albuterol, docusate sodium, guaiFENesin-dextromethorphan, hydrALAZINE, ondansetron (ZOFRAN) IV, mouth rinse, polyethylene glycol   IMPRESSION & RECOMMENDATIONS: COLDEN SAMARAS is a 88 y.o. African-American male whose past medical history and cardiac risk factors include: Persistent atrial fibrillation (last DCCV 12/16/2015), HFrEF, COPD, history of syncope, nonobstructive CAD, type 2 diabetes, hypertension, OSA on CPAP.  Impression: Acute hypoxic respiratory failure-multifactorial (acute COVID-19 infection, underlying COPD/acute HFrEF exacerbation, cardiomyopathy, A-fib with RVR) Acute HFrEF, stage C, NYHA class II/III Elevated high sensitive troponin is likely secondary to supply demand ischemia Cardiomyopathy Persistent atrial fibrillation Nonobstructive CAD History of CVA Non-insulin-dependent diabetes mellitus type 2 Hypertension Hyperlipidemia/statin intolerance OSA on CPAP  Plan: Acute HFrEF, stage C, NYHA class II/III Improving. No longer requiring BiPAP support.  Still requiring nasal cannula oxygen support I's and O's are not accurate due to incontinence. Due to her insufficiency we will discontinue Lasix. Continue metoprolol, Farxiga and spironolactone Recommend using Lasix on a as needed basis. May continue up titration of GDMT as outpatient. Once discharged recommend outpatient cardiology follow-up in 2 weeks or sooner if able to Telemetry reviewed. Strict I's and O's and daily weights  Elevated troponins:  Secondary to supply  demand ischemia due to acute hypoxic respiratory failure, A-fib with RVR, underlying cardiomyopathy, acute HFrEF. High sensitive troponins not suggestive of ACS. Denies anginal discomfort this morning. Given the fact that he is not having anginal discomfort and acute COVID-19 infection we will hold off on ischemic work-up during this hospitalization.  Have asked him to reconsider this with his outpatient cardiologist upon discharge.  Persistent atrial fibrillation: History of cardioversion in the past as well as trial of multiple antiarrhythmics per Care Everywhere. We will focus on rate control strategy as recommended by his outpatient electrophysiologist. Tolerated the uptitration of metoprolol tartrate.  Continue Lopressor 50 mg p.o. twice daily Currently on oral anticoagulation for thromboembolic prophylaxis.   Does not endorse evidence of bleeding. CHA2DS2-VASc SCORE is 7 which correlates to 9.6% risk of stroke per year (CHF, HTN, age greater than 45, DM, stroke).   Nonobstructive CAD: Asymptomatic. Last ischemic work-up in 2017 per EMR. Since he is on anticoagulation for A-fib, hold aspirin 81 mg p.o. daily to reduce the chances of bleeding. He has been intolerant to statin therapy in the past. Medications as discussed above  COVID-19 infection: Management per primary team.  Acute hypoxic respiratory failure: Present on admission likely secondary to multifactorial etiologies (acute COVID-19 infection, acute exacerbation of COPD/acute HFrEF, cardiomyopathy, A-fib with RVR) CHF management as discussed above. COVID management per primary team. A-fib with RVR-ventricular rate better controlled  Plan of care discussed with daughter at bedside and nursing staff.  Cardiology will sign off for now we will continue to follow peripherally please reach out if any questions or concerns arises.  Patient's questions and concerns were addressed to his satisfaction. He voices understanding of  the instructions provided during this encounter.   This note was created using a voice recognition software as a result there may be grammatical errors inadvertently enclosed that do not reflect the nature of this encounter. Every attempt is made to correct such errors.  Total time spent: 25 minutes  Mechele Claude [facility]  Pager: [phone] Office: [phone] 09/27/2021, 12:48 PM

## 2021-09-27 NOTE — Inpatient Diabetes Management (Signed)
Inpatient Diabetes Program Recommendations  AACE/ADA: New Consensus Statement on Inpatient Glycemic Control (2015)  Target Ranges:  Prepandial:   less than 140 mg/dL      Peak postprandial:   less than 180 mg/dL (1-2 hours)      Critically ill patients:  140 - 180 mg/dL   Lab Results  Component Value Date   GLUCAP 175 (H) 09/27/2021   HGBA1C 6.3 (H) 07/29/2020    Review of Glycemic Control  Latest Reference Range & Units 09/26/21 07:46 09/26/21 11:22 09/26/21 17:25 09/26/21 20:49 09/27/21 00:45 09/27/21 04:28 09/27/21 08:47  Glucose-Capillary 70 - 99 mg/dL 144 (H) 225 (H) 154 (H) 304 (H) 93 127 (H) 175 (H)  (H): Data is abnormally high  Diabetes history: DM2 Outpatient Diabetes medications: Diet controlled Current orders for Inpatient glycemic control: Novolog 0-15 units q 4 hrs., Farxiga 10 mg qd., Solucortef 90 mg q 12 hrs (scheduled to D/C tomorrow afternoon), then Prednisone 50 mg qd  Inpatient Diabetes Program Recommendations:   When IV steroids changed to po Prednisone tomorrow, please consider: -Decrease Novolog correction to 0-9 units q 4 hrs.  Thank you, Nani Gasser. Icela Glymph, RN, MSN, CDE  Diabetes Coordinator Inpatient Glycemic Control Team Team Pager [phone] (8am-5pm) 09/27/2021 10:55 AM

## 2021-09-27 NOTE — Progress Notes (Signed)
Initial Nutrition Assessment  DOCUMENTATION CODES:   Non-severe (moderate) malnutrition in context of chronic illness  INTERVENTION:  Liberalize diet from a heart healthy to a regular diet to provide widest variety of menu options to enhance nutritional adequacy Ensure Enlive po BID, each supplement provides 350 kcal and 20 grams of protein. Nightly snacks to optimize nutritional intake  NUTRITION DIAGNOSIS:   Moderate Malnutrition related to chronic illness (CHF, COPD, afib) as evidenced by moderate fat depletion, severe muscle depletion, moderate muscle depletion.  GOAL:   Patient will meet greater than or equal to 90% of their needs  MONITOR:   PO intake, Supplement acceptance, Diet advancement, Labs, Weight trends, I & O's  REASON FOR ASSESSMENT:   Consult Assessment of nutrition requirement/status  ASSESSMENT:   Pt admitted with AMS and SOB r/t acute CHF. Incidentally found to be COVID+. PMH significant for chronic afib, COPD, T2DM, HTN, HLD, knee pain s/p TKR, hx recent L hip replacement and TURP, recent cholecystectomy (08/2021).  BiPAP switched to nightly as needed. Mentation improving. Cardiology adjusting diureses.   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   Pt sitting up in chair at time of visit. Nutrition history obtained from pt and family member present at bedside. He reports that during admission he has been eating better than he normally does. He previously was eating 2 meals per day but states that during admission he has been eating >/=70% of each meal.   Meal completions: 9/11: 100% breakfast, 80% lunch  He reports about a 10 lb weight loss down from 182 lbs within the last month which he relates to diuresis as he denies recent changes to his PO intake. Reviewed weight history. Pt noted to have had a 4.1% weight loss between 12/21/20-09/27/21 which is not clinically significant for time frame.   Edema: non-pitting BLE  Given pt with advanced age, moderate  malnutrition and early dinner time, he would benefit from nightly snacks as well as nutrition supplements to enhance nutritional adequacy.   Medications: Vitamin C 500mg  daily, farxiga, folic acid, lasix, SSI 0-15 units q4h, prednisone, protonix, thiamine  Labs: BUN 48, Cr 1.67, GFR 38, HgbA1c 6.3%, CBG's 93-304 x24 hours  UOP: 1623ml x24 hours I/O's: -1382ml since admission  NUTRITION - FOCUSED PHYSICAL EXAM:  Flowsheet Row Most Recent Value  Orbital Region Mild depletion  Upper Arm Region Severe depletion  Thoracic and Lumbar Region Moderate depletion  Buccal Region Moderate depletion  Temple Region Mild depletion  Clavicle Bone Region Severe depletion  Clavicle and Acromion Bone Region Severe depletion  Scapular Bone Region Moderate depletion  Dorsal Hand Moderate depletion  Patellar Region Severe depletion  Anterior Thigh Region Severe depletion  Posterior Calf Region Moderate depletion  Edema (RD Assessment) None  Hair Reviewed  Eyes Reviewed  Mouth Reviewed  Skin Reviewed  Nails Reviewed      Diet Order:   Diet Order             Diet regular Room service appropriate? Yes; Fluid consistency: Thin; Fluid restriction: 1500 mL Fluid  Diet effective now                  EDUCATION NEEDS:   Education needs have been addressed  Skin:  Skin Assessment: Skin Integrity Issues: Skin Integrity Issues:: Incisions Incisions: lower medial umbilicus  Last BM:  4/09 (type 6)  Height:   Ht Readings from Last 1 Encounters:  09/26/21 6\' 4"  (1.93 m)    Weight:   Wt Readings from  Last 1 Encounters:  09/27/21 84.4 kg   BMI:  Body mass index is 22.65 kg/m.  Estimated Nutritional Needs:   Kcal:  2000-2200  Protein:  105-120g  Fluid:  >/=2L  Clayborne Dana, RDN, LDN Clinical Nutrition

## 2021-09-27 NOTE — Progress Notes (Signed)
PROGRESS NOTE    Jordan Hoffman  NKN:[id number] DOB: 07-29-1928 DOA: 09/25/2021 PCP: [facility]   Brief Narrative:  Jordan Hoffman is a 88 y.o. male with medical history significant of but not limited to chronic atrial fibrillation on anticoagulation, back pain, COPD, diabetes mellitus type 2, hypertension, hyperlipidemia, history of knee pain status post total knee replacement, history of recent left hip replacement and TURP and other comorbidities who presented to the ED with altered mental status and worsening shortness of breath.  Of note the patient underwent a recent cholecystectomy back in August 27, 2021 at [facility] and was discharged to a SNF [facility].  In addition to his recent cholecystectomy had a recent CVA with residual right-sided weakness for which he is making progress at the rehab.  Patient was possibly discharged this upcoming Friday unfortunately contracted COVID at the facility.  While at the facility he was noted to be altered and confused and per family report had not been acting like himself.  He had not been sleeping and then developed progressively worsening shortness of breath.  EMS was called on the morning of 09/25/2021 and they found him to have saturations in the 80s and 90s and he was in A-fib with RVR.  He was given 1 L of LR, albuterol and Atrovent as well as magnesium and also given 125 mg of Solu-Medrol en route.  While in EMS he was placed on nonrebreather   On arrival to the emergency room he initially required 4 L supplemental oxygen which then subsequently had to be placed on BiPAP.  ABG was done and he tested positive for COVID as well.  Chest x-ray showed significant cardiomegaly and vascular congestion and given IV Lasix and breathing treatments however given that he continued to have BiPAP needs and became more somnolent on the BiPAP PCCM was consulted for his respiratory failure.  He had been transferred to the ICU at that time but since  he stabilized he was transferred back to the [facility] service on 09/26/2021 and is now out of the ICU.  BiPAP has been made nightly and as needed and he is improving.  Mentation is doing a lot better today he is awake and alert and oriented.  Steroids are being weaned and transition to p.o.  He is still getting diuresed per cardiology is now decreasing his diuresis to just 40 mg p.o. daily.  Overall he is improved and will need an Ambulatory home O2 screen prior to discharge and continue to monitor closely.  Today is his last day of IV steroids and will be transition to oral prednisone tomorrow.  Will need ambulatory home O2 screen and r in the a.m. and continue PT and OT evaluation.  Cardiology uptitrated his metoprolol tartrate and discontinued his Lasix given his slight bump in creatinine.  They are recommending continuing metoprolol Farxiga spironolactone and using Lasix as a needed basis.  Patient continues to be on oxygen and was on 2 L.  Inflammatory markers are trending down.  He is much more awake and alert and oxygen requirement is weaning from 4 -> 2 L today.  May be able to be discharged without oxygen but will need an amatory home O2 screen prior to discharge in the morning  Assessment and Plan:  Acute respiratory failure with hypoxia requiring noninvasive positive pressure ventilation with BiPAP likely secondary to COVID pneumonia as well as decompensated CHF in the setting of A-fib with RVR -Has a multifactorial  decompensation and tested positive COVID -Given Lasix in the ED and will continue -Had to be placed on BiPAP and cardiology was consulted -SpO2: 100 % O2 Flow Rate (L/min): 2 L/min FiO2 (%): 40 %  -ABG    Component Value Date/Time   PHART 7.370 09/25/2021 1018   PCO2ART 38.3 09/25/2021 1018   PO2ART 74 (L) 09/25/2021 1018   HCO3 22.2 09/25/2021 1018   TCO2 23 09/25/2021 1018   ACIDBASEDEF 3.0 (H) 09/25/2021 1018   O2SAT 95 09/25/2021 1018    -Continuous pulse oximetry  maintain O2 saturation greater than 90% -Continue supplemental oxygen via nasal cannula wean O2 as tolerated -He will will need an ambulatory home O2 screen prior to discharge as well as a repeat chest x-ray; he is improving and respiratory status is improving as well -Pulmonary consulted and felt this is multifactorial in addition with CHF and COVID but they feel that COVID is more likely an incidental finding -He was given IV Solu-Medrol which i will be changed to prednisone in the a.m. and pulm recommending continued diuresis as well as airborne and droplet precautions -Patient is improving and likely can be discharged in next 24 to 48 hours if stable   COVID-19 infection -Inflammatory marker trend Recent Labs    09/25/21 1227 09/27/21 0508  DDIMER 1.00* 0.94*  FERRITIN 113 76  LDH 139  --   CRP 4.1* 1.7*    Lab Results  Component Value Date   SARSCOV2NAA POSITIVE (A) 09/25/2021   [place] NEGATIVE 12/21/2020   [place] NEGATIVE 07/28/2020   [place] NEGATIVE 04/08/2020   -CRP is downtrending as well as his ESR and D-dimer as well as fibrinogen    -Continue with airborne and droplet precautions -Given Solu-Medrol 1 mg/kg IV for 3 days and will be transition to prednisone by pulmonary recommendations at 50 mg daily on Wednesday, 09/28/2021 -Follow inflammatory markers -Check blood cultures x2 as well as procalcitonin level and still not done -Lactic acid level continues to be elevated and is trending up and went from 2.7 -> 2.8 -> 2.5 -> 3.2 on last check and is now 3.1 today -Continue with supportive care and nebs as well as oxygen support and BiPAP as needed for increased work of breathing -Continue with flutter valve and incentive spirometer as well as bronchodilators -Pulmonary had been consulted for further evaluation and there is a to monitor the patient overnight in the ICU but he is doing better and has been transferred out of the ICU to the progressive care unit  on 3 E. -He is status post diuresis per cardiology is below -We will need an ambulatory home O2 screen and continue our monitoring his respiratory status and likely can be discharged with oxygen tomorrow if stable if required   Acute metabolic encephalopathy and confusion in the setting of above -CT head done and "No acute intracranial abnormalities. Encephalomalacia within the left parietal subcortical white matter and cortex compatible with recent posterior left MCA territory infarct. Chronic small vessel ischemic disease and brain atrophy" -Could also be from hypoxia -Urinalysis done and showed many bacteria, 0-5 squamous epithelial cells and 0-5 RBCs per high-power field and 21-50 WBCs -Continue treatment as above to see if he improves and pulmonary recommending limiting sedating meds and he is much improved and back to baseline   Permanent A-fib with RVR -Patient's heart rate is elevated and we will switch his DuoNeb to Xopenex and Atrovent now -Repeat echocardiogram and cardiology was consulted -Troponin was mildly elevated and  relatively flat and likely is demand ischemia due to acute respiratory failure with hypoxia in the setting of A-fib with RVR as well as acute on chronic CHF -Given metoprolol IV -Checked TSH and is now 0.513 -Chads 2 vas score is 7 and cardiology is recommending rate control and continue thromboembolic prophylaxis with Apixaban -Cardiology following and ECHO done as below    Acute on chronic heart failure with reduced ejection fraction Hypertension and hyperlipidemia Elevated troponin -Repeat echo here -Chest x-ray done yesterday showed "Pulmonary edema and cardiomegaly consistent with CHF." -BNP was 1146.4 and he did receive 1 L prior to coming in -Cardiology consulted and he has had a very thorough work-up at the [facility]; they feel that his elevated troponin is demand ischemia in the setting of his acute illness -Titration of GDMT has been  difficult in outpatient setting given his syncope, renal deficiency and hypotension -ECHO done and showed "Prior study outside hospital 08/28/2021: LVEF 30-35%, severe global hypokinesis, mild LAE, moderate RAE, moderate MR, mild to moderate TR, IVC dilated, no pericardial effusion."  -Cardiology recommended diuresis but have now stopped and starting [place] with uptitrating GDMT if able; given his renal dysfunction and slightly worsening creatinine his IV Lasix is now stopped.  Cardiology did go up on metoprolol and he tolerated this well -Recommend strict I's and O's and daily weights and continuing to monitor telemetry and wean BiPAP as tolerated -Patient is negative 1.365 L since admission -Repeat chest x-ray done and showed "Enlargement of cardiac silhouette with pulmonary vascular congestion and minimal residual pulmonary edema. RIGHT basilar atelectasis with persistent atelectasis versus consolidation and question small pleural effusion at LEFT base."   COPD -Treatment as above -Oxygen is being weaned and he is off the BiPAP.  Will need an ambulatory home O2 screen and repeat chest x-ray in the a.m.   History of CVA -CT scan noted as above -Minimize all sedating medications -Continue PT and OT when more awake and we will order this now: They are recommending SNF and he did desaturate on home amatory screen yesterday so we will repeat in the morning   Hyperbilirubinemia -Patient's T. bili is gone from 1.8 and is now 1.3 -Continue monitor trend and repeat CMP in a.m.   Hypercalcemia -Patient's calcium level from 10.2 and trended up to 10.4 and is now 10.3 -We will need to continue monitor and trend and repeat calcium level in a.m.   Hypoalbuminemia -Patient's albumin level is now 3.2-> 3.1 Continue monitor trend repeat CMP in a.m.   Acute on Chronic kidney disease stage IIIa -Patient's BUNs/creatinine is gone from 19/1.33 and trended up to 29/1.52 yesterday and is now 38/1.67 -Was  getting IV diuresis since now stopped -Avoid further nephrotoxic medications, contrast dyes, hypotension and dehydration to ensure adequate renal perfusion and will need to renally adjust medications -Currently getting diuresis as above -Continue monitor trend and repeat CMP in a.m.   Normocytic Anemia -Patient's hemoglobin/hematocrit went from 13.6/40.0 -> 12.5/40.0 -> 13.0/40.8 -Check anemia panel in a.m. -Continue to monitor for signs and symptoms of bleeding; no overt bleeding noted -Repeat CBC in a.m.    DVT prophylaxis: apixaban (ELIQUIS) tablet 2.5 mg Start: 09/25/21 1754 SCDs Start: 09/25/21 1350 apixaban (ELIQUIS) tablet 2.5 mg    Code Status: Prior Family Communication: Discussed with his daughter at bedside  Disposition Plan:  Level of care: Progressive Status is: Inpatient Remains inpatient appropriate because: SNF and anticipate discharge in next 24 to 48 hours   Consultants:  PCCM  Cardiology  Procedures:  ECHOCARDIOGRAM IMPRESSIONS     1. Left ventricular ejection fraction, by estimation, is 30 to 35%. The  left ventricle has moderately decreased function. The left ventricle  demonstrates global hypokinesis. There is moderate left ventricular  hypertrophy. Left ventricular diastolic  function could not be evaluated due to Afib.   2. Right ventricular systolic function is normal. The right ventricular  size is mildly enlarged. Mildly increased right ventricular wall  thickness. There is moderately elevated pulmonary artery systolic  pressure. The estimated right ventricular systolic   pressure is 12.8 mmHg.   3. Left atrial size was mildly dilated.   4. Right atrial size was dilated.   5. The mitral valve is normal in structure. Mild mitral valve  regurgitation. No evidence of mitral stenosis.   6. Tricuspid valve regurgitation is moderate.   7. The aortic valve is tricuspid. Aortic valve regurgitation is not  visualized. Aortic valve sclerosis is  present, with no evidence of aortic  valve stenosis.   8. The inferior vena cava is dilated in size with <50% respiratory  variability, suggesting right atrial pressure of 15 mmHg.   9. Rhythm strip during this exam demonstrates atrial fibrillation.   Comparison(s): Prior study outside hospital 08/28/2021: LVEF 30-35%,  severe global hypokinesis, mild LAE, moderate RAE, moderate MR, mild to  moderate TR, IVC dilated, no pericardial effusion.   FINDINGS   Left Ventricle: Left ventricular ejection fraction, by estimation, is 30  to 35%. The left ventricle has moderately decreased function. The left  ventricle demonstrates global hypokinesis. The left ventricular internal  cavity size was small. There is  moderate left ventricular hypertrophy. Left ventricular diastolic function  could not be evaluated due to Afib.   Right Ventricle: The right ventricular size is mildly enlarged. Mildly  increased right ventricular wall thickness. Right ventricular systolic  function is normal. There is moderately elevated pulmonary artery systolic  pressure. The tricuspid regurgitant  velocity is 3.05 m/s, and with an assumed right atrial pressure of 15  mmHg, the estimated right ventricular systolic pressure is 78.6 mmHg.   Left Atrium: Left atrial size was mildly dilated.   Right Atrium: Right atrial size was dilated.   Pericardium: There is no evidence of pericardial effusion.   Mitral Valve: The mitral valve is normal in structure. Mild mitral valve  regurgitation. No evidence of mitral valve stenosis.   Tricuspid Valve: The tricuspid valve is grossly normal. Tricuspid valve  regurgitation is moderate . No evidence of tricuspid stenosis.   Aortic Valve: The aortic valve is tricuspid. Aortic valve regurgitation is  not visualized. Aortic valve sclerosis is present, with no evidence of  aortic valve stenosis. Aortic valve mean gradient measures 3.5 mmHg.  Aortic valve peak gradient measures  6.2   mmHg. Aortic valve area, by VTI measures 2.71 cm.   Pulmonic Valve: The pulmonic valve was grossly normal. Pulmonic valve  regurgitation is not visualized. No evidence of pulmonic stenosis.   Aorta: The aortic root and ascending aorta are structurally normal, with  no evidence of dilitation.   Venous: The inferior vena cava is dilated in size with less than 50%  respiratory variability, suggesting right atrial pressure of 15 mmHg.   IAS/Shunts: No atrial level shunt detected by color flow Doppler.   EKG: Rhythm strip during this exam demonstrates atrial fibrillation.      LEFT VENTRICLE  PLAX 2D  LVIDd:         4.50 cm  Diastology  LVIDs:         3.80 cm      LV e' medial:    6.42 cm/s  LV PW:         1.20 cm      LV E/e' medial:  14.0  LV IVS:        1.40 cm      LV e' lateral:   10.90 cm/s  LVOT diam:     2.30 cm      LV E/e' lateral: 8.2  LV SV:         49  LV SV Index:   23  LVOT Area:     4.15 cm     LV Volumes (MOD)  LV vol d, MOD A2C: 100.0 ml  LV vol d, MOD A4C: 161.0 ml  LV vol s, MOD A2C: 65.8 ml  LV vol s, MOD A4C: 99.6 ml  LV SV MOD A2C:     34.2 ml  LV SV MOD A4C:     161.0 ml  LV SV MOD BP:      49.2 ml   RIGHT VENTRICLE  RV Basal diam:  4.50 cm  RV Mid diam:    3.20 cm  RV S prime:     13.40 cm/s  TAPSE (M-mode): 1.8 cm   LEFT ATRIUM             Index        RIGHT ATRIUM           Index  LA diam:        3.40 cm 1.57 cm/m   RA Area:     37.10 cm  LA Vol (A2C):   96.8 ml 44.66 ml/m  RA Volume:   141.00 ml 65.05 ml/m  LA Vol (A4C):   43.1 ml 19.88 ml/m  LA Biplane Vol: 68.0 ml 31.37 ml/m   AORTIC VALVE                    PULMONIC VALVE  AV Area (Vmax):    2.71 cm     PV Vmax:       0.86 m/s  AV Area (Vmean):   2.57 cm     PV Peak grad:  2.9 mmHg  AV Area (VTI):     2.71 cm  AV Vmax:           124.25 cm/s  AV Vmean:          87.775 cm/s  AV VTI:            0.180 m  AV Peak Grad:      6.2 mmHg  AV Mean Grad:      3.5 mmHg  LVOT  Vmax:         81.03 cm/s  LVOT Vmean:        54.300 cm/s  LVOT VTI:          0.118 m  LVOT/AV VTI ratio: 0.65     AORTA  Ao Root diam: 4.10 cm  Ao Asc diam:  3.40 cm   MITRAL VALVE               TRICUSPID VALVE  MV Area (PHT): 5.79 cm    TR Peak grad:   37.2 mmHg  MV Decel Time: 131 msec    TR Vmax:        305.00 cm/s  MR Peak grad: 70.2 mmHg  MR Vmax:  419.00 cm/s  SHUNTS  MV E velocity: 89.70 cm/s  Systemic VTI:  0.12 m  MV A velocity: 26.30 cm/s  Systemic Diam: 2.30 cm  MV E/A ratio:  3.41   Antimicrobials:  Anti-infectives (From admission, onward)    Start     Dose/Rate Route Frequency Ordered Stop   09/26/21 1000  remdesivir 100 mg in sodium chloride 0.9 % 100 mL IVPB  Status:  Discontinued       See Hyperspace for full Linked Orders Report.   100 mg 200 mL/hr over 30 Minutes Intravenous Daily 09/25/21 1349 09/25/21 1355   09/25/21 1349  remdesivir 200 mg in sodium chloride 0.9% 250 mL IVPB  Status:  Discontinued       See Hyperspace for full Linked Orders Report.   200 mg 580 mL/hr over 30 Minutes Intravenous Once 09/25/21 1349 09/25/21 1355       Subjective: Seen and examined at bedside in his again much more awake and alert.  Oxygen has been weaned to 2 L.  Denies any current complaints.  He is much more awake and alert and diuresed fairly well but creatinine bumped so diuresis being held.  Cardiology has uptitrated some of his medications.  He is still receiving IV Solu-Medrol and will be transition to oral prednisone.  Thinks his breathing is doing little bit better.  No other complaints or complaints at this time  Objective: Vitals:   09/27/21 0847 09/27/21 0849 09/27/21 0850 09/27/21 0920  BP:      Pulse:      Resp:    20  Temp:    98.1 F (36.7 C)  TempSrc:    Oral  SpO2: 97% 100% 100%   Weight:      Height:        Intake/Output Summary (Last 24 hours) at 09/27/2021 1728 Last data filed at 09/27/2021 0544 Gross per 24 hour  Intake --  Output 900  ml  Net -900 ml   Filed Weights   09/26/21 0450 09/26/21 1358 09/27/21 0500  Weight: 86.2 kg 82.5 kg 84.4 kg   Examination: Physical Exam:  Constitutional: Elderly African-American male currently no acute distress appears calm and he is awake and alert in no acute distress Respiratory: Diminished to auscultation bilaterally, no wheezing, rales, rhonchi or crackles. Normal respiratory effort and patient is not tachypenic. No accessory muscle use.  Unlabored breathing Cardiovascular: RRR, no murmurs / rubs / gallops. S1 and S2 auscultated.  No extremity edema Abdomen: Soft, non-tender, non-distended. Bowel sounds positive.  GU: Deferred. Musculoskeletal: No clubbing / cyanosis of digits/nails. No joint deformity upper and lower extremities.  Skin: No rashes, lesions, ulcers on limited skin evaluation. No induration; Warm and dry.  Neurologic: CN 2-12 grossly intact with no focal deficits. Romberg sign and cerebellar reflexes not assessed.  Psychiatric: Normal judgment and insight. Alert and oriented x 2. Normal mood and appropriate affect.   Data Reviewed: I have personally reviewed following labs and imaging studies  CBC: Recent Labs  Lab 09/25/21 0745 09/25/21 1018 09/26/21 0729 09/27/21 0508  WBC 6.6  --  7.6 11.6*  NEUTROABS 5.0  --  6.9 11.0*  HGB 12.4* 13.6 12.5* 13.0  HCT 39.5 40.0 40.0 40.8  MCV 90.2  --  89.1 88.7  PLT 256  --  246 967   Basic Metabolic Panel: Recent Labs  Lab 09/25/21 0745 09/25/21 1018 09/26/21 0729 09/27/21 0508  NA 141 140 141 141  K 4.4 4.2 4.5 4.8  CL  106  --  105 104  CO2 25  --  26 28  GLUCOSE 178*  --  149* 148*  BUN 19  --  29* 38*  CREATININE 1.33*  --  1.52* 1.67*  CALCIUM 10.2  --  10.4* 10.3  MG 2.4  --  2.0 2.0  PHOS  --   --  3.9 3.9   GFR: Estimated Creatinine Clearance: 34.4 mL/min (A) (by C-G formula based on SCr of 1.67 mg/dL (H)). Liver Function Tests: Recent Labs  Lab 09/25/21 0745 09/26/21 0729 09/27/21 0508   AST '22 17 20  ' ALT '13 13 12  ' ALKPHOS 121 110 105  BILITOT 1.8* 1.4* 1.3*  PROT 7.5 7.2 7.3  ALBUMIN 3.2* 3.2* 3.1*   No results for input(s): "LIPASE", "AMYLASE" in the last 168 hours. No results for input(s): "AMMONIA" in the last 168 hours. Coagulation Profile: No results for input(s): "INR", "PROTIME" in the last 168 hours. Cardiac Enzymes: No results for input(s): "CKTOTAL", "CKMB", "CKMBINDEX", "TROPONINI" in the last 168 hours. BNP (last 3 results) No results for input(s): "PROBNP" in the last 8760 hours. HbA1C: No results for input(s): "HGBA1C" in the last 72 hours. CBG: Recent Labs  Lab 09/26/21 2049 09/27/21 0045 09/27/21 0428 09/27/21 0847 09/27/21 1111  GLUCAP 304* 93 127* 175* 164*   Lipid Profile: No results for input(s): "CHOL", "HDL", "LDLCALC", "TRIG", "CHOLHDL", "LDLDIRECT" in the last 72 hours. Thyroid Function Tests: Recent Labs    09/26/21 0729  TSH 0.513   Anemia Panel: Recent Labs    09/25/21 1227 09/27/21 0508  FERRITIN 113 76   Sepsis Labs: Recent Labs  Lab 09/25/21 1227 09/25/21 1449 09/25/21 1625 09/27/21 0508  PROCALCITON  --  <0.10  --   --   LATICACIDVEN 2.8* 2.5* 3.2* 3.1*    Recent Results (from the past 240 hour(s))  SARS Coronavirus 2 by RT PCR (hospital order, performed in [place] hospital lab) *cepheid single result test* Anterior Nasal Swab     Status: Abnormal   Collection Time: 09/25/21  8:22 AM   Specimen: Anterior Nasal Swab  Result Value Ref Range Status   SARS Coronavirus 2 by RT PCR POSITIVE (A) NEGATIVE Final    Comment: (NOTE) SARS-CoV-2 target nucleic acids are DETECTED  SARS-CoV-2 RNA is generally detectable in upper respiratory specimens  during the acute phase of infection.  Positive results are indicative  of the presence of the identified virus, but do not rule out bacterial infection or co-infection with other pathogens not detected by the test.  Clinical correlation with patient history and   other diagnostic information is necessary to determine patient infection status.  The expected result is negative.  Fact Sheet for Patients:   https://www.patel.info/   Fact Sheet for Healthcare Providers:   https://hall.com/    This test is not yet approved or cleared by the [place] FDA and  has been authorized for detection and/or diagnosis of SARS-CoV-2 by FDA under an Emergency Use Authorization (EUA).  This EUA will remain in effect (meaning this test can be used) for the duration of  the COVID-19 declaration under Section 564(b)(1)  of the Act, 21 U.S.C. section 360-bbb-3(b)(1), unless the authorization is terminated or revoked sooner.   Performed at [facility], [place] [address] [place], [place] [address]   MRSA Next Gen by PCR, Nasal     Status: None   Collection Time: 09/25/21  5:38 PM   Specimen: Nasal Mucosa; Nasal Swab  Result  Value Ref Range Status   MRSA by PCR Next Gen NOT DETECTED NOT DETECTED Final    Comment: (NOTE) The GeneXpert MRSA Assay (FDA approved for NASAL specimens only), is one component of a comprehensive MRSA colonization surveillance program. It is not intended to diagnose MRSA infection nor to guide or monitor treatment for MRSA infections. Test performance is not FDA approved in patients less than 31 years old. Performed at [facility], [place] [address] [place], [place] [address]     Radiology Studies: DG CHEST PORT 1 VIEW  Result Date: 09/27/2021 CLINICAL DATA:  Shortness of breath EXAM: PORTABLE CHEST 1 VIEW COMPARISON:  Portable exam 0528 hours compared to 09/26/2021 FINDINGS: Enlargement of cardiac silhouette with pulmonary vascular congestion. Persistent atelectasis versus consolidation LEFT lower lobe. Subsegmental atelectasis RIGHT base. Minimal persistent interstitial prominence likely reflecting mild residual pulmonary edema. Minimal LEFT pleural effusion without  pneumothorax. Advanced RIGHT glenohumeral degenerative changes. IMPRESSION: Enlargement of cardiac silhouette with pulmonary vascular congestion and minimal residual pulmonary edema. RIGHT basilar atelectasis with persistent atelectasis versus consolidation and question small pleural effusion at LEFT base. Electronically Signed   By: Lavonia Dana M.D.   On: 09/27/2021 08:24   ECHOCARDIOGRAM COMPLETE  Result Date: 09/26/2021    ECHOCARDIOGRAM REPORT   Patient Name:   COURT[place] REAS Igou Date of Exam: 09/26/2021 Medical Rec #:  [id number]   Height:       76.0 in Accession #:    [id number]  Weight:       190.0 lb Date of Birth:  Jan 02, 1929    BSA:          2.168 m Patient Age:    79 years    BP:           125/106 mmHg Patient Gender: M           HR:           98 bpm. Exam Location:  Inpatient Procedure: 2D Echo, Cardiac Doppler and Color Doppler Indications:     Acute Respiratory Distress; I48.1 Persistent atrial                  fibrillation; I50.41 Acute combined systolic (congestive) and                  diastolic (congestive) heart failure; R06.02 SOB  History:         Patient has no prior history of Echocardiogram examinations.                  CHF and Cardiomyopathy, Stroke, Arrythmias:Atrial Fibrillation,                  Signs/Symptoms:Dyspnea, Risk Factors:Hypertension, Diabetes and                  Dyslipidemia.; Medications:Beta Blockers.  Sonographer:     Harvie Junior Referring Phys:  [id number] Cleaster Corin OLLIS Diagnosing Phys: Rex Kras DO IMPRESSIONS  1. Left ventricular ejection fraction, by estimation, is 30 to 35%. The left ventricle has moderately decreased function. The left ventricle demonstrates global hypokinesis. There is moderate left ventricular hypertrophy. Left ventricular diastolic function could not be evaluated due to Afib.  2. Right ventricular systolic function is normal. The right ventricular size is mildly enlarged. Mildly increased right ventricular wall thickness. There is moderately elevated  pulmonary artery systolic pressure. The estimated right ventricular systolic  pressure is 26.4 mmHg.  3. Left atrial size was mildly dilated.  4. Right atrial  size was dilated.  5. The mitral valve is normal in structure. Mild mitral valve regurgitation. No evidence of mitral stenosis.  6. Tricuspid valve regurgitation is moderate.  7. The aortic valve is tricuspid. Aortic valve regurgitation is not visualized. Aortic valve sclerosis is present, with no evidence of aortic valve stenosis.  8. The inferior vena cava is dilated in size with <50% respiratory variability, suggesting right atrial pressure of 15 mmHg.  9. Rhythm strip during this exam demonstrates atrial fibrillation. Comparison(s): Prior study outside hospital 08/28/2021: LVEF 30-35%, severe global hypokinesis, mild LAE, moderate RAE, moderate MR, mild to moderate TR, IVC dilated, no pericardial effusion. FINDINGS  Left Ventricle: Left ventricular ejection fraction, by estimation, is 30 to 35%. The left ventricle has moderately decreased function. The left ventricle demonstrates global hypokinesis. The left ventricular internal cavity size was small. There is moderate left ventricular hypertrophy. Left ventricular diastolic function could not be evaluated due to Afib. Right Ventricle: The right ventricular size is mildly enlarged. Mildly increased right ventricular wall thickness. Right ventricular systolic function is normal. There is moderately elevated pulmonary artery systolic pressure. The tricuspid regurgitant velocity is 3.05 m/s, and with an assumed right atrial pressure of 15 mmHg, the estimated right ventricular systolic pressure is 43.3 mmHg. Left Atrium: Left atrial size was mildly dilated. Right Atrium: Right atrial size was dilated. Pericardium: There is no evidence of pericardial effusion. Mitral Valve: The mitral valve is normal in structure. Mild mitral valve regurgitation. No evidence of mitral valve stenosis. Tricuspid Valve: The  tricuspid valve is grossly normal. Tricuspid valve regurgitation is moderate . No evidence of tricuspid stenosis. Aortic Valve: The aortic valve is tricuspid. Aortic valve regurgitation is not visualized. Aortic valve sclerosis is present, with no evidence of aortic valve stenosis. Aortic valve mean gradient measures 3.5 mmHg. Aortic valve peak gradient measures 6.2  mmHg. Aortic valve area, by VTI measures 2.71 cm. Pulmonic Valve: The pulmonic valve was grossly normal. Pulmonic valve regurgitation is not visualized. No evidence of pulmonic stenosis. Aorta: The aortic root and ascending aorta are structurally normal, with no evidence of dilitation. Venous: The inferior vena cava is dilated in size with less than 50% respiratory variability, suggesting right atrial pressure of 15 mmHg. IAS/Shunts: No atrial level shunt detected by color flow Doppler. EKG: Rhythm strip during this exam demonstrates atrial fibrillation.  LEFT VENTRICLE PLAX 2D LVIDd:         4.50 cm      Diastology LVIDs:         3.80 cm      LV e' medial:    6.42 cm/s LV PW:         1.20 cm      LV E/e' medial:  14.0 LV IVS:        1.40 cm      LV e' lateral:   10.90 cm/s LVOT diam:     2.30 cm      LV E/e' lateral: 8.2 LV SV:         49 LV SV Index:   23 LVOT Area:     4.15 cm  LV Volumes (MOD) LV vol d, MOD A2C: 100.0 ml LV vol d, MOD A4C: 161.0 ml LV vol s, MOD A2C: 65.8 ml LV vol s, MOD A4C: 99.6 ml LV SV MOD A2C:     34.2 ml LV SV MOD A4C:     161.0 ml LV SV MOD BP:      49.2 ml RIGHT VENTRICLE  RV Basal diam:  4.50 cm RV Mid diam:    3.20 cm RV S prime:     13.40 cm/s TAPSE (M-mode): 1.8 cm LEFT ATRIUM             Index        RIGHT ATRIUM           Index LA diam:        3.40 cm 1.57 cm/m   RA Area:     37.10 cm LA Vol (A2C):   96.8 ml 44.66 ml/m  RA Volume:   141.00 ml 65.05 ml/m LA Vol (A4C):   43.1 ml 19.88 ml/m LA Biplane Vol: 68.0 ml 31.37 ml/m  AORTIC VALVE                    PULMONIC VALVE AV Area (Vmax):    2.71 cm     PV Vmax:        0.86 m/s AV Area (Vmean):   2.57 cm     PV Peak grad:  2.9 mmHg AV Area (VTI):     2.71 cm AV Vmax:           124.25 cm/s AV Vmean:          87.775 cm/s AV VTI:            0.180 m AV Peak Grad:      6.2 mmHg AV Mean Grad:      3.5 mmHg LVOT Vmax:         81.03 cm/s LVOT Vmean:        54.300 cm/s LVOT VTI:          0.118 m LVOT/AV VTI ratio: 0.65  AORTA Ao Root diam: 4.10 cm Ao Asc diam:  3.40 cm MITRAL VALVE               TRICUSPID VALVE MV Area (PHT): 5.79 cm    TR Peak grad:   37.2 mmHg MV Decel Time: 131 msec    TR Vmax:        305.00 cm/s MR Peak grad: 70.2 mmHg MR Vmax:      419.00 cm/s  SHUNTS MV E velocity: 89.70 cm/s  Systemic VTI:  0.12 m MV A velocity: 26.30 cm/s  Systemic Diam: 2.30 cm MV E/A ratio:  3.41 Sunit Tolia DO Electronically signed by Rex Kras DO Signature Date/Time: 09/26/2021/12:13:41 PM    Final    DG Chest Port 1 View  Result Date: 09/26/2021 CLINICAL DATA:  Acute hypoxic respiratory failure with CHF. EXAM: PORTABLE CHEST 1 VIEW COMPARISON:  Portable chest yesterday at 7:53 a.m. FINDINGS: 5:13 a.m.  There is moderate to severe cardiomegaly. There is improvement in central vascular prominence and mild basilar interstitial edema with improvement with regression of edema from the upper lung fields and centrally. There are unchanged small pleural effusions and patchy dense consolidation in the left lower lobe. Perihilar ground-glass opacities are improved and probably due to resolving ground-glass edema. The mediastinum is stable with aortic tortuosity, atherosclerosis ectasia. There is advanced thoracic spondylosis.  Advanced shoulder DJD. IMPRESSION: 1. Improving vascular congestion and interstitial edema. 2. Small pleural effusions are unchanged. 3. Left lower lobe patchy dense consolidation is unchanged. Electronically Signed   By: Telford Nab M.D.   On: 09/26/2021 06:51    Scheduled Meds:  apixaban  2.5 mg Oral BID   arformoterol  15 mcg Nebulization BID   And    umeclidinium bromide  1 puff  Inhalation Daily   vitamin C  500 mg Oral Daily   budesonide  1 mg Nebulization BID   Chlorhexidine Gluconate Cloth  6 each Topical Daily   dapagliflozin propanediol  10 mg Oral Daily   feeding supplement  237 mL Oral BID BM   fluticasone  2 spray Each Nare Daily   folic acid  1 mg Oral Daily   guaiFENesin  600 mg Oral BID   insulin aspart  0-15 Units Subcutaneous Q4H   ipratropium-albuterol  3 mL Nebulization Once   latanoprost  1 drop Both Eyes QHS   methylPREDNISolone (SOLU-MEDROL) injection  90 mg Intravenous Q12H   Followed by   Derrill Memo ON 09/28/2021] predniSONE  50 mg Oral Daily   metoprolol tartrate  50 mg Oral BID   pantoprazole  40 mg Oral Daily   spironolactone  12.5 mg Oral Daily   thiamine  100 mg Oral Daily   Continuous Infusions:   LOS: 2 days   Raiford Noble, DO [facility] Available via Epic secure chat 7am-7pm After these hours, please refer to coverage provider listed on [email or web address] 09/27/2021, 5:28 PM

## 2021-09-27 NOTE — Plan of Care (Signed)
  Problem: Education: Goal: Ability to demonstrate management of disease process will improve Outcome: Progressing Goal: Ability to verbalize understanding of medication therapies will improve Outcome: Progressing   Problem: Activity: Goal: Capacity to carry out activities will improve Outcome: Progressing   Problem: Cardiac: Goal: Ability to achieve and maintain adequate cardiopulmonary perfusion will improve Outcome: Progressing   

## 2021-09-28 ENCOUNTER — Encounter (HOSPITAL_COMMUNITY): Payer: No Typology Code available for payment source

## 2021-09-28 ENCOUNTER — Inpatient Hospital Stay (HOSPITAL_COMMUNITY): Payer: No Typology Code available for payment source

## 2021-09-28 DIAGNOSIS — N183 Chronic kidney disease, stage 3 unspecified: Secondary | ICD-10-CM | POA: Diagnosis not present

## 2021-09-28 DIAGNOSIS — N179 Acute kidney failure, unspecified: Secondary | ICD-10-CM | POA: Diagnosis not present

## 2021-09-28 DIAGNOSIS — I5021 Acute systolic (congestive) heart failure: Secondary | ICD-10-CM | POA: Diagnosis not present

## 2021-09-28 LAB — SEDIMENTATION RATE: Sed Rate: 16 mm/hr (ref 0–16)

## 2021-09-28 LAB — BRAIN NATRIURETIC PEPTIDE: B Natriuretic Peptide: 668.1 pg/mL — ABNORMAL HIGH (ref 0.0–100.0)

## 2021-09-28 LAB — CBC WITH DIFFERENTIAL/PLATELET
Abs Immature Granulocytes: 0.04 10*3/uL (ref 0.00–0.07)
Basophils Absolute: 0 10*3/uL (ref 0.0–0.1)
Basophils Relative: 0 %
Eosinophils Absolute: 0 10*3/uL (ref 0.0–0.5)
Eosinophils Relative: 0 %
HCT: 41.4 % (ref 39.0–52.0)
Hemoglobin: 13.5 g/dL (ref 13.0–17.0)
Immature Granulocytes: 0 %
Lymphocytes Relative: 4 %
Lymphs Abs: 0.4 10*3/uL — ABNORMAL LOW (ref 0.7–4.0)
MCH: 28.7 pg (ref 26.0–34.0)
MCHC: 32.6 g/dL (ref 30.0–36.0)
MCV: 87.9 fL (ref 80.0–100.0)
Monocytes Absolute: 0.2 10*3/uL (ref 0.1–1.0)
Monocytes Relative: 2 %
Neutro Abs: 9.6 10*3/uL — ABNORMAL HIGH (ref 1.7–7.7)
Neutrophils Relative %: 94 %
Platelets: 269 10*3/uL (ref 150–400)
RBC: 4.71 MIL/uL (ref 4.22–5.81)
RDW: 14.9 % (ref 11.5–15.5)
WBC: 10.3 10*3/uL (ref 4.0–10.5)
nRBC: 0 % (ref 0.0–0.2)

## 2021-09-28 LAB — COMPREHENSIVE METABOLIC PANEL
ALT: 13 U/L (ref 0–44)
AST: 18 U/L (ref 15–41)
Albumin: 3.1 g/dL — ABNORMAL LOW (ref 3.5–5.0)
Alkaline Phosphatase: 101 U/L (ref 38–126)
Anion gap: 11 (ref 5–15)
BUN: 50 mg/dL — ABNORMAL HIGH (ref 8–23)
CO2: 28 mmol/L (ref 22–32)
Calcium: 10.2 mg/dL (ref 8.9–10.3)
Chloride: 100 mmol/L (ref 98–111)
Creatinine, Ser: 1.5 mg/dL — ABNORMAL HIGH (ref 0.61–1.24)
GFR, Estimated: 44 mL/min — ABNORMAL LOW (ref >60)
Glucose, Bld: 142 mg/dL — ABNORMAL HIGH (ref 70–99)
Potassium: 4.2 mmol/L (ref 3.5–5.1)
Sodium: 139 mmol/L (ref 135–145)
Total Bilirubin: 1.4 mg/dL — ABNORMAL HIGH (ref 0.3–1.2)
Total Protein: 6.8 g/dL (ref 6.5–8.1)

## 2021-09-28 LAB — GLUCOSE, CAPILLARY
Glucose-Capillary: 124 mg/dL — ABNORMAL HIGH (ref 70–99)
Glucose-Capillary: 137 mg/dL — ABNORMAL HIGH (ref 70–99)
Glucose-Capillary: 169 mg/dL — ABNORMAL HIGH (ref 70–99)
Glucose-Capillary: 238 mg/dL — ABNORMAL HIGH (ref 70–99)

## 2021-09-28 LAB — IRON AND TIBC
Iron: 62 ug/dL (ref 45–182)
Saturation Ratios: 18 % (ref 17.9–39.5)
TIBC: 346 ug/dL (ref 250–450)
UIBC: 284 ug/dL

## 2021-09-28 LAB — MAGNESIUM: Magnesium: 2.1 mg/dL (ref 1.7–2.4)

## 2021-09-28 LAB — RETICULOCYTES
Immature Retic Fract: 14.8 % (ref 2.3–15.9)
RBC.: 4.72 MIL/uL (ref 4.22–5.81)
Retic Count, Absolute: 83.1 10*3/uL (ref 19.0–186.0)
Retic Ct Pct: 1.8 % (ref 0.4–3.1)

## 2021-09-28 LAB — LACTATE DEHYDROGENASE: LDH: 124 U/L (ref 98–192)

## 2021-09-28 LAB — VITAMIN B12: Vitamin B-12: 489 pg/mL (ref 180–914)

## 2021-09-28 LAB — PHOSPHORUS: Phosphorus: 3 mg/dL (ref 2.5–4.6)

## 2021-09-28 LAB — FOLATE: Folate: 18.9 ng/mL (ref >5.9)

## 2021-09-28 LAB — FERRITIN: Ferritin: 91 ng/mL (ref 24–336)

## 2021-09-28 LAB — C-REACTIVE PROTEIN: CRP: 1 mg/dL — ABNORMAL HIGH (ref <1.0)

## 2021-09-28 LAB — D-DIMER, QUANTITATIVE: D-Dimer, Quant: 1.07 ug/mL-FEU — ABNORMAL HIGH (ref 0.00–0.50)

## 2021-09-28 LAB — FIBRINOGEN: Fibrinogen: 423 mg/dL (ref 210–475)

## 2021-09-28 MED ORDER — DOCUSATE SODIUM 100 MG PO CAPS: 100.0000 mg | ORAL_CAPSULE | Freq: Two times a day (BID) | ORAL | 0 refills | Status: AC | PRN

## 2021-09-28 MED ORDER — POLYETHYLENE GLYCOL 3350 17 G PO PACK: 17.0000 g | PACK | Freq: Every day | ORAL | 0 refills | Status: DC | PRN

## 2021-09-28 MED ORDER — PREDNISONE 20 MG PO TABS
40.0000 mg | ORAL_TABLET | Freq: Every day | ORAL | Status: DC
  Administered 2021-09-28: 40 mg via ORAL
  Filled 2021-09-28: qty 2

## 2021-09-28 NOTE — TOC Transition Note (Signed)
Transition of Care [facility]) - CM/SW Discharge Note   Patient Details  Name: Jordan Hoffman MRN: [id number] Date of Birth: December 12, 1928  Transition of [facility] CM/SW Contact:  Bethann Berkshire, [place] Phone Number: 09/28/2021, 1:31 PM   Clinical Narrative:     Patient will DC to: [facility] Anticipated DC date: 09/28/21 Family notified: Daughter Hope Transport by: Corey Harold   Per MD patient ready for DC to [facility] SNF. RN, patient, patient's family, and facility notified of DC. Discharge Summary and FL2 sent to facility. RN to call report prior to discharge [phone]). DC packet on chart. Ambulance transport requested for patient.   CSW will sign off for now as social work intervention is no longer needed. Please consult [place] again if new needs arise.   Final next level of care: Skilled Nursing Facility Barriers to Discharge: No Barriers Identified   Patient Goals and CMS Choice        Discharge Placement              Patient chooses bed at: [facility] Patient to be transferred to facility by: [place] Name of family member notified: Daughter, [facility] Patient and family notified of of transfer: 09/28/21  Discharge Plan and Services                                     Social Determinants of Health (SDOH) Interventions     Readmission Risk Interventions     No data to display

## 2021-09-28 NOTE — Plan of Care (Signed)
  Problem: Education: Goal: Ability to demonstrate management of disease process will improve Outcome: Completed/Met Goal: Ability to verbalize understanding of medication therapies will improve Outcome: Completed/Met Goal: Individualized Educational Video(s) Outcome: Completed/Met   Problem: Activity: Goal: Capacity to carry out activities will improve Outcome: Completed/Met   Problem: Cardiac: Goal: Ability to achieve and maintain adequate cardiopulmonary perfusion will improve Outcome: Completed/Met   Problem: Education: Goal: Knowledge of disease or condition will improve Outcome: Completed/Met Goal: Understanding of medication regimen will improve Outcome: Completed/Met Goal: Individualized Educational Video(s) Outcome: Completed/Met   Problem: Activity: Goal: Ability to tolerate increased activity will improve Outcome: Completed/Met   Problem: Cardiac: Goal: Ability to achieve and maintain adequate cardiopulmonary perfusion will improve Outcome: Completed/Met   Problem: Health Behavior/Discharge Planning: Goal: Ability to safely manage health-related needs after discharge will improve Outcome: Completed/Met   Problem: Education: Goal: Knowledge of risk factors and measures for prevention of condition will improve Outcome: Completed/Met   Problem: Coping: Goal: Psychosocial and spiritual needs will be supported Outcome: Completed/Met   Problem: Respiratory: Goal: Will maintain a patent airway Outcome: Completed/Met Goal: Complications related to the disease process, condition or treatment will be avoided or minimized Outcome: Completed/Met   Problem: Education: Goal: Knowledge of General Education information will improve Description: Including pain rating scale, medication(s)/side effects and non-pharmacologic comfort measures Outcome: Completed/Met   Problem: Activity: Goal: Risk for activity intolerance will decrease Outcome: Completed/Met   Problem:  Nutrition: Goal: Adequate nutrition will be maintained Outcome: Completed/Met

## 2021-09-28 NOTE — Discharge Instructions (Signed)
[facility] with [facility] [place] in 7 days   Get CBC, CMP, 2 view Chest X ray -  checked next visit within 1 week by  SNF MD    Activity: As tolerated with Full fall precautions use walker/cane & assistance as needed  Disposition SNF  Diet: Heart Healthy    Special Instructions: If you have smoked or chewed Tobacco  in the last 2 yrs please stop smoking, stop any regular Alcohol  and or any Recreational drug use.  On your next visit with your primary care physician please Get Medicines reviewed and adjusted.  Please request your Prim.MD to go over all Hospital Tests and Procedure/Radiological results at the follow up, please get all Hospital records sent to your Prim MD by signing hospital release before you go home.  If you experience worsening of your admission symptoms, develop shortness of breath, life threatening emergency, suicidal or homicidal thoughts you must seek medical attention immediately by calling 911 or calling your MD immediately  if symptoms less severe.  You Must read complete instructions/literature along with all the possible adverse reactions/side effects for all the Medicines you take and that have been prescribed to you. Take any new Medicines after you have completely understood and accpet all the possible adverse reactions/side effects.

## 2021-09-28 NOTE — NC FL2 (Signed)
[place] LEVEL OF CARE SCREENING TOOL     IDENTIFICATION  Patient Name: Jordan Hoffman Birthdate: [birth year] Sex: male Admission Date (Current Location): 09/25/2021  [facility] and [place] Number:  Herbalist and Address:  [facility] [place] [address] [place], [place] [address]      Provider Number: [id number]  Attending Physician Name and Address:  Thurnell Lose, MD  Relative Name and Phone Number:  [name], [name] (Spouse)   [phone] (Mobile)    Current Level of Care: Hospital Recommended Level of Care: [place] Prior Approval Number:    Date Approved/Denied:   PASRR Number: [id number]  Discharge Plan: SNF    Current Diagnoses: Patient Active Problem List   Diagnosis Date Noted   Malnutrition of moderate degree 09/27/2021   Acute CHF (congestive heart failure) ([place]) 09/25/2021   Acute respiratory failure with hypoxia ([place]) 09/25/2021   Acute HFrEF (heart failure with reduced ejection fraction) (HCC)    Elevated troponin level not due myocardial infarction    Nonobstructive atherosclerosis of coronary artery    COVID-19 virus infection    Bacterial pneumonia 07/28/2020   Hypoxia 07/28/2020   Paroxysmal atrial fibrillation ([place]) 04/09/2020   ARF (acute renal failure) ([place]) 04/09/2020   Acute renal failure superimposed on stage 3 chronic kidney disease, unspecified acute renal failure type, unspecified whether stage 3a or 3b CKD ([place]) 04/08/2020   Near syncope 12/11/2015   Atrial fibrillation ([place]) 12/11/2015   COPD (chronic obstructive pulmonary disease) ([place]) 12/11/2015   OSA (obstructive sleep apnea) 12/11/2015   Hypertension 12/11/2015   Type 2 diabetes mellitus ([place]) 12/11/2015   Hyperlipidemia 12/11/2015   Lumbosacral spondylosis without myelopathy 10/11/2015   Osteoarthritis 06/21/2015    Orientation RESPIRATION BLADDER Height & Weight     Self, Time, Situation, Place  Normal Incontinent  Weight: 179 lb 14.3 oz (81.6 kg) Height:  6\' 4"  (193 cm)  BEHAVIORAL SYMPTOMS/MOOD NEUROLOGICAL BOWEL NUTRITION STATUS      Continent Diet (see d/c summary)  AMBULATORY STATUS COMMUNICATION OF NEEDS Skin   Extensive Assist Verbally Surgical wounds (Closed incision, umbilicus, lower, medial)                       Personal Care Assistance Level of Assistance  Bathing, Feeding, Dressing Bathing Assistance: Limited assistance Feeding assistance: Independent Dressing Assistance: Limited assistance     Functional Limitations Info  Sight, Hearing, Speech Sight Info: Adequate Hearing Info: Adequate Speech Info: Adequate    SPECIAL CARE FACTORS FREQUENCY  PT (By licensed PT), OT (By licensed OT)     PT Frequency: 5x/week OT Frequency: 5x/week            Contractures Contractures Info: Not present    Additional Factors Info  Code Status, Allergies Code Status Info: Full code Allergies Info: Lisinopril, Pravastatin, sulfa antibiotics, Atorvastatin, penecillins, codeine,, Flunisolide, niacin and related, Loratadine, Simvastatin, statins, Ciprofloxacin, ranitidine, Erythromycin, Niaspan (niacin)           Current Medications (09/28/2021):  This is the current hospital active medication list Current Facility-Administered Medications  Medication Dose Route Frequency Provider Last Rate Last Admin   acetaminophen (TYLENOL) tablet 650 mg  650 mg Oral Q6H PRN Raiford Noble Latif, DO       Or   acetaminophen (TYLENOL) suppository 650 mg  650 mg Rectal Q6H PRN Raiford Noble Latif, DO       acetaminophen (TYLENOL) tablet 1,000 mg  1,000 mg Oral  TID PRN Raiford Noble Latif, DO       albuterol (PROVENTIL) (2.5 MG/3ML) 0.083% nebulizer solution 2.5 mg  2.5 mg Nebulization Q4H PRN Olalere, Adewale A, MD       apixaban (ELIQUIS) tablet 2.5 mg  2.5 mg Oral BID Sheikh, Omair Latif, DO   2.5 mg at 09/28/21 1019   arformoterol (BROVANA) nebulizer solution 15 mcg  15 mcg Nebulization BID  Raiford Noble [place], DO   15 mcg at 09/28/21 1017   And   umeclidinium bromide (INCRUSE ELLIPTA) 62.5 MCG/ACT 1 puff  1 puff Inhalation Daily Raiford Noble [place], DO   1 puff at 09/28/21 5102   ascorbic acid (VITAMIN C) tablet 500 mg  500 mg Oral Daily Raiford Noble [place], DO   500 mg at 09/28/21 1019   budesonide (PULMICORT) nebulizer solution 1 mg  1 mg Nebulization BID Sheikh, Omair Latif, DO   1 mg at 09/28/21 5852   Chlorhexidine Gluconate Cloth 2 % PADS 6 each  6 each Topical Daily Olalere, Adewale A, MD   6 each at 09/27/21 1000   dapagliflozin propanediol (FARXIGA) tablet 10 mg  10 mg Oral Daily Tolia, Sunit, DO   10 mg at 09/28/21 1019   docusate sodium (COLACE) capsule 100 mg  100 mg Oral BID PRN Noe Gens L, NP   100 mg at 09/27/21 7782   feeding supplement (ENSURE ENLIVE / ENSURE PLUS) liquid 237 mL  237 mL Oral BID BM Sheikh, Omair [place], DO   237 mL at 09/28/21 1018   fluticasone (FLONASE) 50 MCG/ACT nasal spray 2 spray  2 spray Each Nare Daily Raiford Noble [place], DO   2 spray at 42/35/36 1443   folic acid (FOLVITE) tablet 1 mg  1 mg Oral Daily Sheikh, Georgina [place], DO   1 mg at 09/28/21 1019   guaiFENesin (MUCINEX) 12 hr tablet 600 mg  600 mg Oral BID Sheikh, Omair Latif, DO   600 mg at 09/28/21 1019   guaiFENesin-dextromethorphan (ROBITUSSIN DM) 100-10 MG/5ML syrup 10 mL  10 mL Oral Q4H PRN Sheikh, Omair Latif, DO       hydrALAZINE (APRESOLINE) injection 10 mg  10 mg Intravenous Q6H PRN Sheikh, Omair Latif, DO       insulin aspart (novoLOG) injection 0-15 Units  0-15 Units Subcutaneous Q4H Ollis, Brandi L, NP   2 Units at 09/28/21 0900   ipratropium-albuterol (DUONEB) 0.5-2.5 (3) MG/3ML nebulizer solution 3 mL  3 mL Nebulization Once Sheikh, Omair Latif, DO       latanoprost (XALATAN) 0.005 % ophthalmic solution 1 drop  1 drop Both Eyes QHS Sheikh, Georgina Quint [place], DO   1 drop at 09/27/21 2122   metoprolol tartrate (LOPRESSOR) tablet 50 mg  50 mg Oral BID Tolia, Sunit, DO   50 mg at  09/28/21 1019   ondansetron (ZOFRAN) injection 4 mg  4 mg Intravenous Q6H PRN Noe Gens L, NP       Oral care mouth rinse  15 mL Mouth Rinse PRN Jacky Kindle, MD       pantoprazole (PROTONIX) EC tablet 40 mg  40 mg Oral Daily Sheikh, Omair [place], DO   40 mg at 09/28/21 1019   polyethylene glycol (MIRALAX / GLYCOLAX) packet 17 g  17 g Oral Daily PRN Ollis, Brandi L, NP       predniSONE (DELTASONE) tablet 40 mg  40 mg Oral Q breakfast Thurnell Lose, MD   40 mg at 09/28/21 1018   spironolactone (ALDACTONE)  tablet 12.5 mg  12.5 mg Oral Daily Tolia, Sunit, DO   12.5 mg at 09/28/21 1019   thiamine (VITAMIN B1) tablet 100 mg  100 mg Oral Daily Ollis, Brandi L, NP   100 mg at 09/28/21 1021     Discharge Medications: Please see discharge summary for a list of discharge medications.  Relevant Imaging Results:  Relevant Lab Results:   Additional Information SSN [place]  Cashion [place], [place]

## 2021-09-28 NOTE — Discharge Summary (Addendum)
Jordan Hoffman NTI:[id number] DOB: 27-Jun-1928 DOA: 09/25/2021  PCP: [facility]  Admit date: 09/25/2021  Discharge date: 09/28/2021  Admitted From: SNF   Disposition:  SNF   Recommendations for Outpatient Follow-up:   Follow up with PCP in 1-2 weeks  PCP Please obtain BMP/CBC, 2 view CXR in 1week,  (see Discharge instructions)   PCP Please follow up on the following pending results: Use as needed Lasix for any fluid overload, check CBC, CMP, two-view chest x-ray in 7 to 10 days.   Home Health: None   Equipment/Devices: None  Consultations: Cards, PCCM Discharge Condition: Stable    CODE STATUS: Full    Diet Recommendation: Heart Healthy     Chief Complaint  Patient presents with   AMS/SHOB     Brief history of present illness from the day of admission and additional interim summary    88 y.o. male with medical history significant of but not limited to chronic atrial fibrillation on anticoagulation, back pain, COPD, diabetes mellitus type 2, hypertension, hyperlipidemia, history of knee pain status post total knee replacement, history of recent left hip replacement and TURP and other comorbidities who presented to the ED with altered mental status and worsening shortness of breath he was diagnosed with COVID-19 pneumonia along with acute on chronic diastolic CHF and admitted to the hospital.                                                                 Hospital Course    Acute hypoxic respiratory failure due to combination of COVID-19 pneumonia along with acute on chronic CHF. He had minimally elevated inflammatory markers, was treated with short course of steroids with complete resolution of his COVID-19 pneumonia symptoms, he is now at baseline with no oxygen requirement at rest, symptom-free, will  get another dose of oral steroid now then will stop.  Encouraged to sit up in chair and SNF use I-S and flutter valve in daytime while awake.  Was also seen by [facility] team here along with cardiology team for CHF.  Again he is on baseline from the standpoint.  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  Has been diuresed proprial with Lasix and Aldactone combination seen by cardiologist Dr. Terri Skains, he is now euvolemic and compensated, continue beta-blocker, no ACE/ARB due to underlying CKD, use as needed diuretics at SNF monitor volume status closely.  Post discharge follow-up with primary cardiologist in 1 to 2 weeks.  Chronic atrial fibrillation with intermittent RVR, [place] vas 2 score of 7.  Continue beta-blocker and Eliquis.  COPD.  Currently at baseline continue supportive care.  History of CVA.  No acute issues.  At baseline.  Continue PT OT at SNF.  On Eliquis continue appears to have statin allergy.  CKD stage IIIa.  Appears to be close to baseline.  Anaemia of chronic disease.  No acute issues.  OSA.  Continue CPAP nightly.    Discharge diagnosis     Principal Problem:   Acute CHF (congestive heart failure) (HCC) Active Problems:   Hypoxia   Atrial fibrillation (HCC)   COPD (chronic obstructive pulmonary disease) (HCC)   Hyperlipidemia   Acute renal failure superimposed on stage 3 chronic kidney disease, unspecified acute renal failure type, unspecified whether stage 3a or 3b CKD (HCC)   Paroxysmal atrial fibrillation (HCC)   Acute respiratory failure with hypoxia (HCC)   Acute HFrEF (heart failure with reduced ejection fraction) (HCC)   Elevated troponin level not due myocardial infarction   COVID-19 virus infection   Malnutrition of moderate degree    Discharge instructions    Discharge Instructions     Diet - low sodium heart healthy   Complete by: As directed    Discharge instructions   Complete by: As directed    Follow with Primary MD  [facility] in 7 days   Get CBC, CMP, 2 view Chest X ray -  checked next visit within 1 week by  SNF MD    Activity: As tolerated with Full fall precautions use walker/cane & assistance as needed  Disposition SNF  Diet: Heart Healthy    Special Instructions: If you have smoked or chewed Tobacco  in the last 2 yrs please stop smoking, stop any regular Alcohol  and or any Recreational drug use.  On your next visit with your primary care physician please Get Medicines reviewed and adjusted.  Please request your Prim.MD to go over all Hospital Tests and Procedure/Radiological results at the follow up, please get all Hospital records sent to your Prim MD by signing hospital release before you go home.  If you experience worsening of your admission symptoms, develop shortness of breath, life threatening emergency, suicidal or homicidal thoughts you must seek medical attention immediately by calling 911 or calling your MD immediately  if symptoms less severe.  You Must read complete instructions/literature along with all the possible adverse reactions/side effects for all the Medicines you take and that have been prescribed to you. Take any new Medicines after you have completely understood and accpet all the possible adverse reactions/side effects.   Increase activity slowly   Complete by: As directed    MyChart COVID-19 home monitoring program   Complete by: Sep 28, 2021    Is the patient willing to use the [place] for home monitoring?: No   No wound care   Complete by: As directed        Discharge Medications   Allergies as of 09/28/2021       Reactions   Lisinopril Cough      Penicillins Other (See Comments)   Did it involve swelling of the face/tongue/throat, SOB, or low BP? Yes Did it involve sudden or severe rash/hives, skin peeling, or any reaction on the inside of your mouth or nose? No Did you need to seek medical attention at a hospital or doctor's  office? No When did it last happen? Unk  If all above answers are "NO", may proceed with cephalosporin use.   *pt tolerated ancef on 08/08/16   Pravastatin Hives      Sulfa Antibiotics Itching, Other (See Comments)   Itching, watery eyes   Atorvastatin Other (See Comments)   Muscle pain   Codeine Hives  Flunisolide Hives   epistaxis Other reaction(s): Bleeding from nose, Other (See Comments) Hives  Hives    Loratadine    Hives  Other reaction(s): Other (See Comments), Other (See Comments) Hives  Hives  Hives    Niacin And Related    Hives    Niaspan [niacin] Hives, Other (See Comments)   Flushing, also   Simvastatin Hives   weakness Other reaction(s): Weakness present   Statins Hives   weakness Other reaction(s): Other (See Comments), Unknown Hives  weakness Hives  Hives  Hives    Ciprofloxacin Rash   Hives  Other reaction(s): Other (See Comments), Other (See Comments) Hives  Hives  Hives    Erythromycin Rash   Hives  Other reaction(s): Other (See Comments), Other (See Comments) Hives  Hives  Hives    Ranitidine Rash   Hives  Other reaction(s): Other (See Comments), Other (See Comments) Hives  Hives  Hives         Medication List     STOP taking these medications    doxycycline 100 MG capsule Commonly known as: VIBRAMYCIN   guaiFENesin-dextromethorphan 100-10 MG/5ML syrup Commonly known as: ROBITUSSIN DM   Pfizer COVID-19 Vac Bivalent injection Generic drug: COVID-19 mRNA bivalent vaccine Therapist, music)       TAKE these medications    acetaminophen 500 MG tablet Commonly known as: TYLENOL Take 1,000 mg by mouth 3 (three) times daily as needed for mild pain or moderate pain.   albuterol (2.5 MG/3ML) 0.083% nebulizer solution Commonly known as: PROVENTIL Take 2.5 mg by nebulization every 4 (four) hours as needed for wheezing or shortness of breath.   albuterol 108 (90 Base) MCG/ACT inhaler Commonly known as: VENTOLIN HFA Inhale 2  puffs into the lungs every 4 (four) hours as needed for wheezing or shortness of breath.   Alvesco 160 MCG/ACT inhaler Generic drug: ciclesonide Inhale 2 puffs into the lungs 2 (two) times daily.   apixaban 2.5 MG Tabs tablet Commonly known as: ELIQUIS Take 1 tablet (2.5 mg total) by mouth 2 (two) times daily.   carboxymethylcellulose 0.5 % Soln Commonly known as: REFRESH PLUS Place 1 drop into both eyes 4 (four) times daily.   cholecalciferol 25 MCG (1000 UNIT) tablet Commonly known as: VITAMIN D3 Take 1,000 Units by mouth See admin instructions. 1 by mouth two times a week on Saturday and Sunday for supplement.   diclofenac Sodium 1 % Gel Commonly known as: VOLTAREN Apply 2 g topically 4 (four) times daily as needed (shoulder pain).   docusate sodium 100 MG capsule Commonly known as: COLACE Take 1 capsule (100 mg total) by mouth 2 (two) times daily as needed for mild constipation.   fluticasone 50 MCG/ACT nasal spray Commonly known as: FLONASE Place 2 sprays into both nostrils daily.   gabapentin 300 MG capsule Commonly known as: NEURONTIN Take 300 mg by mouth 3 (three) times daily.   guaiFENesin 600 MG 12 hr tablet Commonly known as: MUCINEX Take 600 mg by mouth 2 (two) times daily.   latanoprost 0.005 % ophthalmic solution Commonly known as: XALATAN Place 1 drop into both eyes at bedtime.   loratadine 10 MG tablet Commonly known as: CLARITIN Take 10 mg by mouth daily.   Metoprolol Tartrate 37.5 MG Tabs Take 1 tablet by mouth 2 (two) times daily.   omeprazole 20 MG capsule Commonly known as: PRILOSEC Take 20 mg by mouth daily.   OXYGEN Inhale 2 L into the lungs daily.   Oyster Shell 500 MG  Tabs Take 500 mg by mouth 2 (two) times daily.   polyethylene glycol 17 g packet Commonly known as: MIRALAX / GLYCOLAX Take 17 g by mouth daily as needed for moderate constipation.   PSYLLIUM HUSK PO Take 1 capsule by mouth daily.   sodium chloride 0.65 % Soln  nasal spray Commonly known as: OCEAN Place 2 sprays into both nostrils 2 (two) times daily.   Stiolto Respimat 2.5-2.5 MCG/ACT Aers Generic drug: Tiotropium Bromide-Olodaterol Inhale 2 puffs into the lungs daily.         Follow-up [place]. Schedule an appointment as soon as possible for a visit in 1 week(s).   Specialty: General Practice Contact information: [address] [place] [address] [phone]                 Major procedures and Radiology Reports - PLEASE review detailed and final reports thoroughly  -       DG CHEST PORT 1 VIEW  Result Date: 09/28/2021 CLINICAL DATA:  Shortness of breath, cough, COVID. EXAM: PORTABLE CHEST 1 VIEW COMPARISON:  Chest x-ray 09/27/2021. FINDINGS: Similar small left pleural effusion with overlying left basilar opacities. Right-sided skin fold. No visible pneumothorax. Enlarged cardiac silhouette. Severe bilateral shoulder degenerative change, partially imaged. IMPRESSION: 1. Similar small left pleural effusion with overlying left basilar atelectasis and/or consolidation. 2. Cardiomegaly. Electronically Signed   By: Margaretha Sheffield M.D.   On: 09/28/2021 08:20   DG CHEST PORT 1 VIEW  Result Date: 09/27/2021 CLINICAL DATA:  Shortness of breath EXAM: PORTABLE CHEST 1 VIEW COMPARISON:  Portable exam 0528 hours compared to 09/26/2021 FINDINGS: Enlargement of cardiac silhouette with pulmonary vascular congestion. Persistent atelectasis versus consolidation LEFT lower lobe. Subsegmental atelectasis RIGHT base. Minimal persistent interstitial prominence likely reflecting mild residual pulmonary edema. Minimal LEFT pleural effusion without pneumothorax. Advanced RIGHT glenohumeral degenerative changes. IMPRESSION: Enlargement of cardiac silhouette with pulmonary vascular congestion and minimal residual pulmonary edema. RIGHT basilar atelectasis with persistent atelectasis versus consolidation and question small  pleural effusion at LEFT base. Electronically Signed   By: Lavonia Dana M.D.   On: 09/27/2021 08:24   ECHOCARDIOGRAM COMPLETE  Result Date: 09/26/2021    ECHOCARDIOGRAM REPORT   Patient Name:   AMARIUS TOTO Schlee Date of Exam: 09/26/2021 Medical Rec #:  [id number]   Height:       76.0 in Accession #:    [id number]  Weight:       190.0 lb Date of Birth:  10/21/1928    BSA:          2.168 m Patient Age:    39 years    BP:           125/106 mmHg Patient Gender: M           HR:           98 bpm. Exam Location:  Inpatient Procedure: 2D Echo, Cardiac Doppler and Color Doppler Indications:     Acute Respiratory Distress; I48.1 Persistent atrial                  fibrillation; I50.41 Acute combined systolic (congestive) and                  diastolic (congestive) heart failure; R06.02 SOB  History:         Patient has no prior history of Echocardiogram examinations.  CHF and Cardiomyopathy, Stroke, Arrythmias:Atrial Fibrillation,                  Signs/Symptoms:Dyspnea, Risk Factors:Hypertension, Diabetes and                  Dyslipidemia.; Medications:Beta Blockers.  Sonographer:     Harvie Junior Referring Phys:  [id number] Cleaster Corin OLLIS Diagnosing Phys: Rex Kras DO IMPRESSIONS  1. Left ventricular ejection fraction, by estimation, is 30 to 35%. The left ventricle has moderately decreased function. The left ventricle demonstrates global hypokinesis. There is moderate left ventricular hypertrophy. Left ventricular diastolic function could not be evaluated due to Afib.  2. Right ventricular systolic function is normal. The right ventricular size is mildly enlarged. Mildly increased right ventricular wall thickness. There is moderately elevated pulmonary artery systolic pressure. The estimated right ventricular systolic  pressure is 35.4 mmHg.  3. Left atrial size was mildly dilated.  4. Right atrial size was dilated.  5. The mitral valve is normal in structure. Mild mitral valve regurgitation. No evidence of mitral  stenosis.  6. Tricuspid valve regurgitation is moderate.  7. The aortic valve is tricuspid. Aortic valve regurgitation is not visualized. Aortic valve sclerosis is present, with no evidence of aortic valve stenosis.  8. The inferior vena cava is dilated in size with <50% respiratory variability, suggesting right atrial pressure of 15 mmHg.  9. Rhythm strip during this exam demonstrates atrial fibrillation. Comparison(s): Prior study outside hospital 08/28/2021: LVEF 30-35%, severe global hypokinesis, mild LAE, moderate RAE, moderate MR, mild to moderate TR, IVC dilated, no pericardial effusion. FINDINGS  Left Ventricle: Left ventricular ejection fraction, by estimation, is 30 to 35%. The left ventricle has moderately decreased function. The left ventricle demonstrates global hypokinesis. The left ventricular internal cavity size was small. There is moderate left ventricular hypertrophy. Left ventricular diastolic function could not be evaluated due to Afib. Right Ventricle: The right ventricular size is mildly enlarged. Mildly increased right ventricular wall thickness. Right ventricular systolic function is normal. There is moderately elevated pulmonary artery systolic pressure. The tricuspid regurgitant velocity is 3.05 m/s, and with an assumed right atrial pressure of 15 mmHg, the estimated right ventricular systolic pressure is 65.6 mmHg. Left Atrium: Left atrial size was mildly dilated. Right Atrium: Right atrial size was dilated. Pericardium: There is no evidence of pericardial effusion. Mitral Valve: The mitral valve is normal in structure. Mild mitral valve regurgitation. No evidence of mitral valve stenosis. Tricuspid Valve: The tricuspid valve is grossly normal. Tricuspid valve regurgitation is moderate . No evidence of tricuspid stenosis. Aortic Valve: The aortic valve is tricuspid. Aortic valve regurgitation is not visualized. Aortic valve sclerosis is present, with no evidence of aortic valve stenosis.  Aortic valve mean gradient measures 3.5 mmHg. Aortic valve peak gradient measures 6.2  mmHg. Aortic valve area, by VTI measures 2.71 cm. Pulmonic Valve: The pulmonic valve was grossly normal. Pulmonic valve regurgitation is not visualized. No evidence of pulmonic stenosis. Aorta: The aortic root and ascending aorta are structurally normal, with no evidence of dilitation. Venous: The inferior vena cava is dilated in size with less than 50% respiratory variability, suggesting right atrial pressure of 15 mmHg. IAS/Shunts: No atrial level shunt detected by color flow Doppler. EKG: Rhythm strip during this exam demonstrates atrial fibrillation.  LEFT VENTRICLE PLAX 2D LVIDd:         4.50 cm      Diastology LVIDs:         3.80 cm  LV e' medial:    6.42 cm/s LV PW:         1.20 cm      LV E/e' medial:  14.0 LV IVS:        1.40 cm      LV e' lateral:   10.90 cm/s LVOT diam:     2.30 cm      LV E/e' lateral: 8.2 LV SV:         49 LV SV Index:   23 LVOT Area:     4.15 cm  LV Volumes (MOD) LV vol d, MOD A2C: 100.0 ml LV vol d, MOD A4C: 161.0 ml LV vol s, MOD A2C: 65.8 ml LV vol s, MOD A4C: 99.6 ml LV SV MOD A2C:     34.2 ml LV SV MOD A4C:     161.0 ml LV SV MOD BP:      49.2 ml RIGHT VENTRICLE RV Basal diam:  4.50 cm RV Mid diam:    3.20 cm RV S prime:     13.40 cm/s TAPSE (M-mode): 1.8 cm LEFT ATRIUM             Index        RIGHT ATRIUM           Index LA diam:        3.40 cm 1.57 cm/m   RA Area:     37.10 cm LA Vol (A2C):   96.8 ml 44.66 ml/m  RA Volume:   141.00 ml 65.05 ml/m LA Vol (A4C):   43.1 ml 19.88 ml/m LA Biplane Vol: 68.0 ml 31.37 ml/m  AORTIC VALVE                    PULMONIC VALVE AV Area (Vmax):    2.71 cm     PV Vmax:       0.86 m/s AV Area (Vmean):   2.57 cm     PV Peak grad:  2.9 mmHg AV Area (VTI):     2.71 cm AV Vmax:           124.25 cm/s AV Vmean:          87.775 cm/s AV VTI:            0.180 m AV Peak Grad:      6.2 mmHg AV Mean Grad:      3.5 mmHg LVOT Vmax:         81.03 cm/s LVOT  Vmean:        54.300 cm/s LVOT VTI:          0.118 m LVOT/AV VTI ratio: 0.65  AORTA Ao Root diam: 4.10 cm Ao Asc diam:  3.40 cm MITRAL VALVE               TRICUSPID VALVE MV Area (PHT): 5.79 cm    TR Peak grad:   37.2 mmHg MV Decel Time: 131 msec    TR Vmax:        305.00 cm/s MR Peak grad: 70.2 mmHg MR Vmax:      419.00 cm/s  SHUNTS MV E velocity: 89.70 cm/s  Systemic VTI:  0.12 m MV A velocity: 26.30 cm/s  Systemic Diam: 2.30 cm MV E/A ratio:  3.41 Sunit Tolia DO Electronically signed by Rex Kras DO Signature Date/Time: 09/26/2021/12:13:41 PM    Final    DG Chest Port 1 View  Result Date: 09/26/2021 CLINICAL DATA:  Acute hypoxic respiratory failure with CHF. EXAM:  PORTABLE CHEST 1 VIEW COMPARISON:  Portable chest yesterday at 7:53 a.m. FINDINGS: 5:13 a.m.  There is moderate to severe cardiomegaly. There is improvement in central vascular prominence and mild basilar interstitial edema with improvement with regression of edema from the upper lung fields and centrally. There are unchanged small pleural effusions and patchy dense consolidation in the left lower lobe. Perihilar ground-glass opacities are improved and probably due to resolving ground-glass edema. The mediastinum is stable with aortic tortuosity, atherosclerosis ectasia. There is advanced thoracic spondylosis.  Advanced shoulder DJD. IMPRESSION: 1. Improving vascular congestion and interstitial edema. 2. Small pleural effusions are unchanged. 3. Left lower lobe patchy dense consolidation is unchanged. Electronically Signed   By: Telford Nab M.D.   On: 09/26/2021 06:51   CT Head Wo Contrast  Result Date: 09/25/2021 CLINICAL DATA:  Altered mental status.  Nontraumatic. EXAM: CT HEAD WITHOUT CONTRAST TECHNIQUE: Contiguous axial images were obtained from the base of the skull through the vertex without intravenous contrast. RADIATION DOSE REDUCTION: This exam was performed according to the departmental dose-optimization program which includes  automated exposure control, adjustment of the mA and/or kV according to patient size and/or use of iterative reconstruction technique. COMPARISON:  08/30/2021 FINDINGS: Brain: No evidence of acute infarction, hemorrhage, hydrocephalus, extra-axial collection or mass lesion/mass effect.Encephalomalacia within the left parietal subcortical white matter and cortex compatible with recent posterior left MCA territory infarct, image 22/3. Remote left basal ganglia lacunar infarct noted. There is mild diffuse low-attenuation within the subcortical and periventricular white matter compatible with chronic microvascular disease. Prominence of the sulci and ventricles compatible with brain atrophy. Vascular: No hyperdense vessel or unexpected calcification. Skull: Normal. Negative for fracture or focal lesion. Sinuses/Orbits: Chronic mucoperiosteal thickening involving the left maxillary sinus noted. Mastoid air cells are clear. Other: None. IMPRESSION: 1. No acute intracranial abnormalities. 2. Encephalomalacia within the left parietal subcortical white matter and cortex compatible with recent posterior left MCA territory infarct. 3. Chronic small vessel ischemic disease and brain atrophy. Electronically Signed   By: Kerby Moors M.D.   On: 09/25/2021 10:42   DG Chest 1 View  Result Date: 09/25/2021 CLINICAL DATA:  Altered mental status, short of breath EXAM: CHEST  1 VIEW COMPARISON:  Prior chest x-ray 08/26/2021 FINDINGS: Similar degree of marked cardiomegaly. Interval increase in diffuse pulmonary vascular congestion and interstitial airspace opacities with [place] B-lines peripherally. Findings are consistent with interstitial pulmonary edema. No large pneumothorax or pleural effusion. No acute osseous abnormality. IMPRESSION: Pulmonary edema and cardiomegaly consistent with CHF. Electronically Signed   By: Jacqulynn Cadet M.D.   On: 09/25/2021 08:00    Micro Results    Recent Results (from the past 240  hour(s))  SARS Coronavirus 2 by RT PCR (hospital order, performed in [facility] hospital lab) *cepheid single result test* Anterior Nasal Swab     Status: Abnormal   Collection Time: 09/25/21  8:22 AM   Specimen: Anterior Nasal Swab  Result Value Ref Range Status   SARS Coronavirus 2 by RT PCR POSITIVE (A) NEGATIVE Final    Comment: (NOTE) SARS-CoV-2 target nucleic acids are DETECTED  SARS-CoV-2 RNA is generally detectable in upper respiratory specimens  during the acute phase of infection.  Positive results are indicative  of the presence of the identified virus, but do not rule out bacterial infection or co-infection with other pathogens not detected by the test.  Clinical correlation with patient history and  other diagnostic information is necessary to determine patient infection status.  The  expected result is negative.  Fact Sheet for Patients:   https://www.patel.info/   Fact Sheet for Healthcare Providers:   https://hall.com/    This test is not yet approved or cleared by the [place] FDA and  has been authorized for detection and/or diagnosis of SARS-CoV-2 by FDA under an Emergency Use Authorization (EUA).  This EUA will remain in effect (meaning this test can be used) for the duration of  the COVID-19 declaration under Section 564(b)(1)  of the Act, 21 U.S.C. section 360-bbb-3(b)(1), unless the authorization is terminated or revoked sooner.   Performed at [facility], [place] [address] [place], [place] [address]   MRSA Next Gen by PCR, Nasal     Status: None   Collection Time: 09/25/21  5:38 PM   Specimen: Nasal Mucosa; Nasal Swab  Result Value Ref Range Status   MRSA by PCR Next Gen NOT DETECTED NOT DETECTED Final    Comment: (NOTE) The GeneXpert MRSA Assay (FDA approved for NASAL specimens only), is one component of a comprehensive MRSA colonization surveillance program. It is not intended to diagnose MRSA  infection nor to guide or monitor treatment for MRSA infections. Test performance is not FDA approved in patients less than 17 years old. Performed at [facility], [place] [address] [place], [place] [address]     Today   Subjective    Niki Cosman today has no headache,no chest abdominal pain,no new weakness tingling or numbness, feels much better wants to go home today.     Objective   Blood pressure (!) 143/97, pulse 85, temperature 98 F (36.7 C), temperature source Oral, resp. rate 16, height 6\' 4"  (1.93 m), weight 81.6 kg, SpO2 97 %.   Intake/Output Summary (Last 24 hours) at 09/28/2021 1026 Last data filed at 09/28/2021 0032 Gross per 24 hour  Intake 240 ml  Output 950 ml  Net -710 ml    Exam  Awake Alert, No new F.N deficits,    [place].AT,PERRAL Supple Neck,   Symmetrical Chest wall movement, Good air movement bilaterally, CTAB RRR,No Gallops,   +ve B.Sounds, Abd Soft, Non tender,  No Cyanosis, Clubbing or edema    Data Review   Recent Labs  Lab 09/25/21 0745 09/25/21 1018 09/26/21 0729 09/27/21 0508 09/28/21 0510  WBC 6.6  --  7.6 11.6* 10.3  HGB 12.4* 13.6 12.5* 13.0 13.5  HCT 39.5 40.0 40.0 40.8 41.4  PLT 256  --  246 256 269  MCV 90.2  --  89.1 88.7 87.9  MCH 28.3  --  27.8 28.3 28.7  MCHC 31.4  --  31.3 31.9 32.6  RDW 14.9  --  14.9 14.9 14.9  LYMPHSABS 1.1  --  0.5* 0.5* 0.4*  MONOABS 0.4  --  0.1 0.1 0.2  EOSABS 0.0  --  0.0 0.0 0.0  BASOSABS 0.1  --  0.0 0.0 0.0    Recent Labs  Lab 09/25/21 0745 09/25/21 1018 09/25/21 1227 09/25/21 1449 09/25/21 1625 09/26/21 0729 09/27/21 0508 09/28/21 0510  NA 141 140  --   --   --  141 141 139  K 4.4 4.2  --   --   --  4.5 4.8 4.2  CL 106  --   --   --   --  105 104 100  CO2 25  --   --   --   --  26 28 28   GLUCOSE 178*  --   --   --   --  149* 148* 142*  BUN 19  --   --   --   --  29* 38* 50*  CREATININE 1.33*  --   --   --   --  1.52* 1.67* 1.50*  CALCIUM 10.2  --   --   --   --  10.4*  10.3 10.2  AST 22  --   --   --   --  17 20 18   ALT 13  --   --   --   --  13 12 13   ALKPHOS 121  --   --   --   --  110 105 101  BILITOT 1.8*  --   --   --   --  1.4* 1.3* 1.4*  ALBUMIN 3.2*  --   --   --   --  3.2* 3.1* 3.1*  MG 2.4  --   --   --   --  2.0 2.0 2.1  PHOS  --   --   --   --   --  3.9 3.9 3.0  CRP  --   --  4.1*  --   --   --  1.7* 1.0*  DDIMER  --   --  1.00*  --   --   --  0.94* 1.07*  PROCALCITON  --   --   --  <0.10  --   --   --   --   LATICACIDVEN 2.7*  --  2.8* 2.5* 3.2*  --  3.1*  --   TSH  --   --   --   --   --  0.513  --   --   BNP 1,146.4*  --   --   --   --   --   --  668.1*    Total Time in preparing paper work, data evaluation and todays exam - 35 minutes  Lala Lund M.D on 09/28/2021 at 10:26 AM  [facility]

## 2021-09-28 NOTE — Evaluation (Signed)
Occupational Therapy Evaluation Patient Details Name: Jordan Hoffman MRN: [id number] DOB: [birth year] Today's Date: 09/28/2021   History of Present Illness Pt  is 88 yo male admitted on 09/25/21 with COVID resp failure.  Pt was at SNF [facility]) for rehab s/p CVA with R sided weakness.  He also recently had cholecystectomy 08/27/21.  Pt with other hx including afib, back pain, DM2, HTN, HLD, R TKA, and recent L THA.   Clinical Impression   Pt reports needing assist at baseline while at SNF for ADLs, and uses RW for functional mobility, however used cane prior to CVA. Pt needing min-modA for ADLs, and min guard for transfers with RW. Pt able to complete in room ambulation and standing grooming task at sink with min fatigue, SpO2 92% after transfer and ADL task on RA. Pt presenting with impairments listed below, will follow acutely. Recommend SNF at d/c.      Recommendations for follow up therapy are one component of a multi-disciplinary discharge planning process, led by the attending physician.  Recommendations may be updated based on patient status, additional functional criteria and insurance authorization.   Follow Up Recommendations  Skilled nursing-short term rehab (<3 hours/day)    Assistance Recommended at Discharge Intermittent Supervision/Assistance  Patient can return home with the following A little help with walking and/or transfers;A lot of help with bathing/dressing/bathroom;Assistance with cooking/housework;Direct supervision/assist for medications management;Direct supervision/assist for financial management;Assist for transportation;Help with stairs or ramp for entrance    Functional Status Assessment  Patient has had a recent decline in their functional status and demonstrates the ability to make significant improvements in function in a reasonable and predictable amount of time.  Equipment Recommendations  None recommended by OT;Other (comment) (defer)    Recommendations for  Other Services PT consult     Precautions / Restrictions Precautions Precautions: Fall Restrictions Weight Bearing Restrictions: No      Mobility Bed Mobility               General bed mobility comments: OOB in chair upon arrival    Transfers Overall transfer level: Needs assistance Equipment used: Rolling walker (2 wheels) Transfers: Sit to/from Stand Sit to Stand: Min guard                  Balance Overall balance assessment: Needs assistance Sitting-balance support: No upper extremity supported Sitting balance-Leahy Scale: Good     Standing balance support: Bilateral upper extremity supported, Reliant on assistive device for balance Standing balance-Leahy Scale: Poor Standing balance comment: reliant on external support                           ADL either performed or assessed with clinical judgement   ADL Overall ADL's : Needs assistance/impaired Eating/Feeding: Set up   Grooming: Min guard   Upper Body Bathing: Minimal assistance   Lower Body Bathing: Moderate assistance   Upper Body Dressing : Minimal assistance   Lower Body Dressing: Moderate assistance   Toilet Transfer: Min guard;Rolling walker (2 wheels);Regular Toilet;Ambulation   Toileting- Clothing Manipulation and Hygiene: Minimal assistance       Functional mobility during ADLs: Min guard;Rolling walker (2 wheels)       Vision   Vision Assessment?: No apparent visual deficits     Perception     Praxis      Pertinent Vitals/Pain Pain Assessment Pain Assessment: No/denies pain     Hand Dominance     Extremity/Trunk Assessment  Upper Extremity Assessment Upper Extremity Assessment: RUE deficits/detail;LUE deficits/detail RUE Deficits / Details: Bil shoulder with very limited ROM; R UE strength grossly 4/5 LUE Deficits / Details: Bil shoulder with very limited ROM; L UE strength grossly 5/5   Lower Extremity Assessment Lower Extremity Assessment: Defer  to PT evaluation   Cervical / Trunk Assessment Cervical / Trunk Assessment: Normal   Communication Communication Communication: No difficulties   Cognition Arousal/Alertness: Awake/alert Behavior During Therapy: WFL for tasks assessed/performed Overall Cognitive Status: Within Functional Limits for tasks assessed                                 General Comments: Slower to respond at times; daughter reports much better than admission     General Comments  SpO2 92% on RA after ambulation in room and standing grooming task at sink; spouse present, encouraged IS use    Exercises     Shoulder Instructions      Home Living Family/patient expects to be discharged to:: Private residence Living Arrangements: Spouse/significant other Available Help at Discharge: Family;Available 24 hours/day Type of Home: House Home Access: Level entry     Home Layout: One level;Other (Comment)     Bathroom Shower/Tub: Occupational psychologist: Handicapped height Bathroom Accessibility: Yes   Home Equipment: [place] - single Barista (2 wheels);Rollator (4 wheels);Grab bars - tub/shower;Shower seat   Additional Comments: reports shower chair is too small      Prior Functioning/Environment Prior Level of Function : Needs assist             Mobility Comments: Was at rehab for CVA with R sided weakness and was about ready to d/c home but contracted COVID.  Was walking with help and RW at rehab.  Prior to CVA could ambulate short community distances with cane and up/down 2 steps at home ADLs Comments: Was independent with ADLs and light IADLs prior to recent CVA        OT Problem List: Decreased strength;Decreased range of motion;Decreased activity tolerance;Impaired balance (sitting and/or standing);Decreased cognition;Decreased safety awareness;Cardiopulmonary status limiting activity;Impaired UE functional use      OT Treatment/Interventions:  Self-care/ADL training;Therapeutic exercise;DME and/or AE instruction;Therapeutic activities;Visual/perceptual remediation/compensation;Patient/family education;Balance training;Energy conservation    OT Goals(Current goals can be found in the care plan section) Acute Rehab OT Goals Patient Stated Goal: none stated OT Goal Formulation: With patient Time For Goal Achievement: 10/12/21 Potential to Achieve Goals: Good ADL Goals Pt Will Perform Upper Body Dressing: with supervision;sitting;standing Pt Will Perform Lower Body Dressing: with supervision;sitting/lateral leans;sit to/from stand Pt Will Transfer to Toilet: with supervision;ambulating;regular height toilet Pt Will Perform Tub/Shower Transfer: Tub transfer;Shower transfer;ambulating;shower seat;rolling walker Additional ADL Goal #1: pt will perform bed mobility with supervision in prep for ADLs  OT Frequency: Min 2X/week    Co-evaluation              AM-PAC OT "6 Clicks" Daily Activity     Outcome Measure Help from another person eating meals?: A Little Help from another person taking care of personal grooming?: A Little Help from another person toileting, which includes using toliet, bedpan, or urinal?: A Lot Help from another person bathing (including washing, rinsing, drying)?: A Lot Help from another person to put on and taking off regular upper body clothing?: A Little Help from another person to put on and taking off regular lower body clothing?: A Lot 6 Click Score:  15   End of Session Equipment Utilized During Treatment: Rolling walker (2 wheels);Gait belt Nurse Communication: Mobility status  Activity Tolerance: Patient tolerated treatment well Patient left: in chair;with call bell/phone within reach;with chair alarm set;with family/visitor present  OT Visit Diagnosis: Unsteadiness on feet (R26.81);Other abnormalities of gait and mobility (R26.89);Muscle weakness (generalized) (M62.81)                Time:  1030-1050 OT Time Calculation (min): 20 min Charges:  OT General Charges $OT Visit: 1 Visit OT Evaluation $OT Eval Moderate Complexity: 1 [address] OTD, OTR/L Acute Rehab [phone]) 832 - [place] 09/28/2021, 10:58 AM

## 2021-09-28 NOTE — Progress Notes (Signed)
Physical Therapy Treatment Patient Details Name: Jordan Hoffman MRN: [id number] DOB: 01-Feb-1928 Today's Date: 09/28/2021   History of Present Illness Pt  is 88 yo male admitted on 09/25/21 with COVID resp failure.  Pt was at SNF [facility]) for rehab s/p CVA with R sided weakness.  He also recently had cholecystectomy 08/27/21.  Pt with other hx including afib, back pain, DM2, HTN, HLD, R TKA, and recent L THA.    PT Comments    Pt received supine and agreeable to session with good progress toward acute goals. Session focused on gait training with RW for increased activity tolerance. Pt requiring up to min guard for transfers and gait with pt continuing to fatigue easily needing seated recovery throughout session. Pt on RA on arrival with SpO2 95%, however SpO2 dropping to 80s on RA during ambulation and pt requiring 3L to recover >90% with seated rest (see saturations qualifications note). Pt demonstrating IS use and verbalizing understanding of importance of use and verbalizing understanding of benefits and importance of continued mobility. Pt continues to benefit from skilled PT services to progress toward functional mobility goals.    Recommendations for follow up therapy are one component of a multi-disciplinary discharge planning process, led by the attending physician.  Recommendations may be updated based on patient status, additional functional criteria and insurance authorization.  Follow Up Recommendations  Skilled nursing-short term rehab (<3 hours/day) Can patient physically be transported by private vehicle: Yes   Assistance Recommended at Discharge Intermittent Supervision/Assistance  Patient can return home with the following A little help with walking and/or transfers;A little help with bathing/dressing/bathroom;Assistance with cooking/housework;Help with stairs or ramp for entrance   Equipment Recommendations  None recommended by PT    Recommendations for Other Services        Precautions / Restrictions Precautions Precautions: Fall     Mobility  Bed Mobility Overal bed mobility: Needs Assistance Bed Mobility: Supine to Sit     Supine to sit: Supervision          Transfers Overall transfer level: Needs assistance Equipment used: Rolling walker (2 wheels) Transfers: Sit to/from Stand Sit to Stand: Min assist, From elevated surface           General transfer comment: cues for hand placement and light min A to rise; performed x 3 throughout session    Ambulation/Gait Ambulation/Gait assistance: Min guard Gait Distance (Feet): 45 Feet (+ 24' +24') Assistive device: Rolling walker (2 wheels) Gait Pattern/deviations: Step-through pattern, Decreased stride length Gait velocity: decreased     General Gait Details: Min guard for safety and stability; cues for RW use; fatigued eaisly needing seated recovery   Stairs             Wheelchair Mobility    Modified Rankin (Stroke Patients Only)       Balance Overall balance assessment: Needs assistance Sitting-balance support: No upper extremity supported Sitting balance-Leahy Scale: Good     Standing balance support: Bilateral upper extremity supported, Reliant on assistive device for balance Standing balance-Leahy Scale: Poor                              Cognition Arousal/Alertness: Awake/alert Behavior During Therapy: WFL for tasks assessed/performed Overall Cognitive Status: Within Functional Limits for tasks assessed  General Comments: Slower to respond at times; daughter reports much better than admission        Exercises Other Exercises Other Exercises: IS x5 with pt pulling [id number]    General Comments General comments (skin integrity, edema, etc.): pt on RA on arrival, SpO2 95%, ambulating on RA SpO2 down to 85%, unable to recover on 2L, increased to 3L SpO2 >90%      Pertinent Vitals/Pain Pain  Assessment Pain Assessment: No/denies pain    Home Living                          Prior Function            PT Goals (current goals can now be found in the care plan section) Acute Rehab PT Goals Patient Stated Goal: return to SNF to finish rehab PT Goal Formulation: With patient/family Time For Goal Achievement: 10/10/21    Frequency    Min 3X/week      PT Plan      Co-evaluation              AM-PAC PT "6 Clicks" Mobility   Outcome Measure  Help needed turning from your back to your side while in a flat bed without using bedrails?: None Help needed moving from lying on your back to sitting on the side of a flat bed without using bedrails?: A Little Help needed moving to and from a bed to a chair (including a wheelchair)?: A Little Help needed standing up from a chair using your arms (e.g., wheelchair or bedside chair)?: A Little Help needed to walk in hospital room?: A Little Help needed climbing 3-5 steps with a railing? : A Lot 6 Click Score: 18    End of Session Equipment Utilized During Treatment: Gait belt;Oxygen Activity Tolerance: Patient tolerated treatment well Patient left: in chair;with call bell/phone within reach;with family/visitor present Nurse Communication: Mobility status PT Visit Diagnosis: Other abnormalities of gait and mobility (R26.89);Hemiplegia and hemiparesis Hemiplegia - Right/Left: Right Hemiplegia - dominant/non-dominant: Dominant Hemiplegia - caused by: Cerebral infarction     Time: 4680-3212 PT Time Calculation (min) (ACUTE ONLY): 38 min  Charges:  $Gait Training: 23-37 mins $Therapeutic Activity: 8-22 mins                     Darreld Hoffer R. PTA Acute Rehabilitation Services Office: [place] 09/28/2021, 9:54 AM

## 2021-09-28 NOTE — Progress Notes (Signed)
SATURATION QUALIFICATIONS: (This note is used to comply with regulatory documentation for home oxygen)  Patient Saturations on Room Air at Rest = 95%  Patient Saturations on Room Air while Ambulating = 85%  Patient Saturations on 3 Liters of oxygen while Ambulating = 94%  Please briefly explain why patient needs home oxygen:Pt desaturates on room air with mobility and requires supplemental oxygen to maintain a safe saturation level.  Audry Riles. PTA Acute Rehabilitation Services Office: [phone]

## 2021-09-28 NOTE — Progress Notes (Signed)
RN went over discharge instructions with patient and patient's daughter at the bedside. RN went over medication changes and follow up visits. Patient and patient's daughter verbalize understanding. RN removed PIV. Site is clean dry and intact. RN removed tele monitor and CCMD notified of d/c. Patient and patient's daughter made aware that [facility] will be transporting the patient to SNF.

## 2021-09-28 NOTE — Progress Notes (Signed)
Report given to the nurse SNF, all questions answered.  IV and Tele removed, patient daughter at the bedside. Patient awaiting transportation back to SNF.

## 2021-10-13 ENCOUNTER — Inpatient Hospital Stay (HOSPITAL_BASED_OUTPATIENT_CLINIC_OR_DEPARTMENT_OTHER)
Admission: EM | Admit: 2021-10-13 | Discharge: 2021-10-18 | DRG: 291 | Disposition: A | Discharge status: Home Health | Payer: No Typology Code available for payment source | Attending: Internal Medicine | Admitting: Internal Medicine

## 2021-10-13 ENCOUNTER — Emergency Department (HOSPITAL_BASED_OUTPATIENT_CLINIC_OR_DEPARTMENT_OTHER): Payer: No Typology Code available for payment source

## 2021-10-13 ENCOUNTER — Encounter (HOSPITAL_COMMUNITY): Payer: Self-pay

## 2021-10-13 ENCOUNTER — Other Ambulatory Visit: Payer: Self-pay

## 2021-10-13 DIAGNOSIS — Z885 Allergy status to narcotic agent status: Secondary | ICD-10-CM

## 2021-10-13 DIAGNOSIS — R7989 Other specified abnormal findings of blood chemistry: Secondary | ICD-10-CM | POA: Diagnosis present

## 2021-10-13 DIAGNOSIS — Z7901 Long term (current) use of anticoagulants: Secondary | ICD-10-CM

## 2021-10-13 DIAGNOSIS — I130 Hypertensive heart and chronic kidney disease with heart failure and stage 1 through stage 4 chronic kidney disease, or unspecified chronic kidney disease: Principal | ICD-10-CM | POA: Diagnosis present

## 2021-10-13 DIAGNOSIS — N179 Acute kidney failure, unspecified: Secondary | ICD-10-CM | POA: Diagnosis not present

## 2021-10-13 DIAGNOSIS — Z8701 Personal history of pneumonia (recurrent): Secondary | ICD-10-CM

## 2021-10-13 DIAGNOSIS — Z801 Family history of malignant neoplasm of trachea, bronchus and lung: Secondary | ICD-10-CM

## 2021-10-13 DIAGNOSIS — I2489 Other forms of acute ischemic heart disease: Secondary | ICD-10-CM | POA: Diagnosis present

## 2021-10-13 DIAGNOSIS — E7800 Pure hypercholesterolemia, unspecified: Secondary | ICD-10-CM | POA: Diagnosis present

## 2021-10-13 DIAGNOSIS — R1310 Dysphagia, unspecified: Secondary | ICD-10-CM | POA: Diagnosis present

## 2021-10-13 DIAGNOSIS — J449 Chronic obstructive pulmonary disease, unspecified: Secondary | ICD-10-CM | POA: Diagnosis present

## 2021-10-13 DIAGNOSIS — Z823 Family history of stroke: Secondary | ICD-10-CM

## 2021-10-13 DIAGNOSIS — Z7951 Long term (current) use of inhaled steroids: Secondary | ICD-10-CM

## 2021-10-13 DIAGNOSIS — Z841 Family history of disorders of kidney and ureter: Secondary | ICD-10-CM

## 2021-10-13 DIAGNOSIS — Z888 Allergy status to other drugs, medicaments and biological substances status: Secondary | ICD-10-CM | POA: Diagnosis not present

## 2021-10-13 DIAGNOSIS — N1831 Chronic kidney disease, stage 3a: Secondary | ICD-10-CM | POA: Diagnosis present

## 2021-10-13 DIAGNOSIS — D649 Anemia, unspecified: Secondary | ICD-10-CM | POA: Diagnosis not present

## 2021-10-13 DIAGNOSIS — Z66 Do not resuscitate: Secondary | ICD-10-CM | POA: Diagnosis not present

## 2021-10-13 DIAGNOSIS — I5043 Acute on chronic combined systolic (congestive) and diastolic (congestive) heart failure: Secondary | ICD-10-CM | POA: Diagnosis not present

## 2021-10-13 DIAGNOSIS — Z96651 Presence of right artificial knee joint: Secondary | ICD-10-CM | POA: Diagnosis present

## 2021-10-13 DIAGNOSIS — Z96642 Presence of left artificial hip joint: Secondary | ICD-10-CM | POA: Diagnosis present

## 2021-10-13 DIAGNOSIS — G4733 Obstructive sleep apnea (adult) (pediatric): Secondary | ICD-10-CM | POA: Diagnosis not present

## 2021-10-13 DIAGNOSIS — Z9049 Acquired absence of other specified parts of digestive tract: Secondary | ICD-10-CM | POA: Diagnosis not present

## 2021-10-13 DIAGNOSIS — I252 Old myocardial infarction: Secondary | ICD-10-CM | POA: Diagnosis not present

## 2021-10-13 DIAGNOSIS — I4891 Unspecified atrial fibrillation: Secondary | ICD-10-CM | POA: Diagnosis present

## 2021-10-13 DIAGNOSIS — Z87891 Personal history of nicotine dependence: Secondary | ICD-10-CM

## 2021-10-13 DIAGNOSIS — Z882 Allergy status to sulfonamides status: Secondary | ICD-10-CM

## 2021-10-13 DIAGNOSIS — M25569 Pain in unspecified knee: Secondary | ICD-10-CM | POA: Diagnosis present

## 2021-10-13 DIAGNOSIS — Z8673 Personal history of transient ischemic attack (TIA), and cerebral infarction without residual deficits: Secondary | ICD-10-CM

## 2021-10-13 DIAGNOSIS — Z880 Allergy status to penicillin: Secondary | ICD-10-CM

## 2021-10-13 DIAGNOSIS — E1122 Type 2 diabetes mellitus with diabetic chronic kidney disease: Secondary | ICD-10-CM | POA: Diagnosis present

## 2021-10-13 DIAGNOSIS — Z794 Long term (current) use of insulin: Secondary | ICD-10-CM

## 2021-10-13 DIAGNOSIS — Z8616 Personal history of COVID-19: Secondary | ICD-10-CM | POA: Diagnosis not present

## 2021-10-13 DIAGNOSIS — I5022 Chronic systolic (congestive) heart failure: Secondary | ICD-10-CM | POA: Diagnosis present

## 2021-10-13 DIAGNOSIS — I5023 Acute on chronic systolic (congestive) heart failure: Secondary | ICD-10-CM | POA: Diagnosis present

## 2021-10-13 DIAGNOSIS — I480 Paroxysmal atrial fibrillation: Secondary | ICD-10-CM | POA: Diagnosis present

## 2021-10-13 DIAGNOSIS — Z9079 Acquired absence of other genital organ(s): Secondary | ICD-10-CM

## 2021-10-13 DIAGNOSIS — M549 Dorsalgia, unspecified: Secondary | ICD-10-CM | POA: Diagnosis present

## 2021-10-13 DIAGNOSIS — E119 Type 2 diabetes mellitus without complications: Secondary | ICD-10-CM

## 2021-10-13 DIAGNOSIS — Z8719 Personal history of other diseases of the digestive system: Secondary | ICD-10-CM | POA: Diagnosis not present

## 2021-10-13 DIAGNOSIS — Z881 Allergy status to other antibiotic agents status: Secondary | ICD-10-CM

## 2021-10-13 DIAGNOSIS — E785 Hyperlipidemia, unspecified: Secondary | ICD-10-CM | POA: Diagnosis present

## 2021-10-13 DIAGNOSIS — I10 Essential (primary) hypertension: Secondary | ICD-10-CM | POA: Diagnosis present

## 2021-10-13 DIAGNOSIS — Z79899 Other long term (current) drug therapy: Secondary | ICD-10-CM | POA: Diagnosis not present

## 2021-10-13 DIAGNOSIS — I509 Heart failure, unspecified: Principal | ICD-10-CM

## 2021-10-13 LAB — BASIC METABOLIC PANEL
Anion gap: 8 (ref 5–15)
BUN: 17 mg/dL (ref 8–23)
CO2: 25 mmol/L (ref 22–32)
Calcium: 9.4 mg/dL (ref 8.9–10.3)
Chloride: 105 mmol/L (ref 98–111)
Creatinine, Ser: 1.16 mg/dL (ref 0.61–1.24)
GFR, Estimated: 59 mL/min — ABNORMAL LOW (ref >60)
Glucose, Bld: 117 mg/dL — ABNORMAL HIGH (ref 70–99)
Potassium: 4 mmol/L (ref 3.5–5.1)
Sodium: 138 mmol/L (ref 135–145)

## 2021-10-13 LAB — CBC WITH DIFFERENTIAL/PLATELET
Abs Immature Granulocytes: 0.03 10*3/uL (ref 0.00–0.07)
Basophils Absolute: 0.1 10*3/uL (ref 0.0–0.1)
Basophils Relative: 1 %
Eosinophils Absolute: 0 10*3/uL (ref 0.0–0.5)
Eosinophils Relative: 0 %
HCT: 38.7 % — ABNORMAL LOW (ref 39.0–52.0)
Hemoglobin: 12.2 g/dL — ABNORMAL LOW (ref 13.0–17.0)
Immature Granulocytes: 0 %
Lymphocytes Relative: 20 %
Lymphs Abs: 1.4 10*3/uL (ref 0.7–4.0)
MCH: 27.9 pg (ref 26.0–34.0)
MCHC: 31.5 g/dL (ref 30.0–36.0)
MCV: 88.6 fL (ref 80.0–100.0)
Monocytes Absolute: 0.6 10*3/uL (ref 0.1–1.0)
Monocytes Relative: 9 %
Neutro Abs: 4.7 10*3/uL (ref 1.7–7.7)
Neutrophils Relative %: 70 %
Platelets: 205 10*3/uL (ref 150–400)
RBC: 4.37 MIL/uL (ref 4.22–5.81)
RDW: 15.9 % — ABNORMAL HIGH (ref 11.5–15.5)
WBC: 6.7 10*3/uL (ref 4.0–10.5)
nRBC: 0 % (ref 0.0–0.2)

## 2021-10-13 LAB — BRAIN NATRIURETIC PEPTIDE: B Natriuretic Peptide: 1356.5 pg/mL — ABNORMAL HIGH (ref 0.0–100.0)

## 2021-10-13 LAB — TROPONIN I (HIGH SENSITIVITY)
Troponin I (High Sensitivity): 85 ng/L — ABNORMAL HIGH (ref <18)
Troponin I (High Sensitivity): 72 ng/L — ABNORMAL HIGH (ref <18)

## 2021-10-13 MED ORDER — FUROSEMIDE 10 MG/ML IJ SOLN
60.0000 mg | Freq: Once | INTRAMUSCULAR | Status: AC
  Administered 2021-10-13: 60 mg via INTRAVENOUS
  Filled 2021-10-13: qty 6

## 2021-10-13 MED ORDER — FUROSEMIDE 10 MG/ML IJ SOLN
60.0000 mg | Freq: Once | INTRAMUSCULAR | Status: AC
  Administered 2021-10-14: 60 mg via INTRAVENOUS
  Filled 2021-10-13: qty 6

## 2021-10-13 MED ORDER — FUROSEMIDE 10 MG/ML IJ SOLN: 40.0000 mg | Freq: Once | INTRAMUSCULAR | Status: DC

## 2021-10-13 MED ORDER — ALBUTEROL SULFATE HFA 108 (90 BASE) MCG/ACT IN AERS
4.0000 | INHALATION_SPRAY | Freq: Once | RESPIRATORY_TRACT | Status: AC
  Administered 2021-10-13: 4 via RESPIRATORY_TRACT
  Filled 2021-10-13: qty 6.7

## 2021-10-13 NOTE — ED Triage Notes (Signed)
Pt to ED c/o [facility]  X past couple days . Recently d/c from rehab. 1 week ago today. Recently treated for COVID. Hx COPD. Breathing labored in triage.

## 2021-10-13 NOTE — ED Notes (Signed)
Pt's Wife Jordan Hoffman can be reached at [phone]. Pt's Daughter Jordan Hoffman can be reached at [phone].

## 2021-10-13 NOTE — ED Notes (Signed)
Lab called at this time to add on BNP. Family and patient updated on plan of care.

## 2021-10-13 NOTE — ED Provider Notes (Signed)
[facility] EMERGENCY DEPARTMENT Provider Note   CSN: [id number] Arrival date & time: 10/13/21  1757     History  Chief Complaint  Patient presents with   Shortness of Breath    Jordan Hoffman is a 88 y.o. male.  Patient here with shortness of breath, cough.  Had COVID 2 weeks ago.  Was in rehab but has been home for a week.  He is on Eliquis for A-fib.  He denies any chest pain or fever or chills.  No abdominal pain.  Nothing makes it worse or better.  He has been taking his inhaler.  History of diabetes, hypertension high cholesterol.  No history of heart failure.  Denies any leg swelling.  The history is provided by the patient.       Home Medications Prior to Admission medications   Medication Sig Start Date End Date Taking? Authorizing Provider  acetaminophen (TYLENOL) 500 MG tablet Take 1,000 mg by mouth 3 (three) times daily as needed for mild pain or moderate pain.    [provider]  albuterol (PROVENTIL) (2.5 MG/3ML) 0.083% nebulizer solution Take 2.5 mg by nebulization every 4 (four) hours as needed for wheezing or shortness of breath.    [provider]  albuterol (VENTOLIN HFA) 108 (90 Base) MCG/ACT inhaler Inhale 2 puffs into the lungs every 4 (four) hours as needed for wheezing or shortness of breath.    [provider]  ALVESCO 160 MCG/ACT inhaler Inhale 2 puffs into the lungs 2 (two) times daily. 09/07/21   [provider]  apixaban (ELIQUIS) 2.5 MG TABS tablet Take 1 tablet (2.5 mg total) by mouth 2 (two) times daily. 07/31/20   [name], MD  carboxymethylcellulose (REFRESH PLUS) 0.5 % SOLN Place 1 drop into both eyes 4 (four) times daily. 08/04/19   [provider]  cholecalciferol (VITAMIN D) 25 MCG (1000 UNIT) tablet Take 1,000 Units by mouth See admin instructions. 1 by mouth two times a week on Saturday and Sunday for supplement.    [provider]  diclofenac Sodium (VOLTAREN) 1 % GEL Apply 2 g  topically 4 (four) times daily as needed (shoulder pain).    [provider]  docusate sodium (COLACE) 100 MG capsule Take 1 capsule (100 mg total) by mouth 2 (two) times daily as needed for mild constipation. 09/28/21   Thurnell Lose, MD  fluticasone (FLONASE) 50 MCG/ACT nasal spray Place 2 sprays into both nostrils daily.    [provider]  gabapentin (NEURONTIN) 300 MG capsule Take 300 mg by mouth 3 (three) times daily.    [provider]  guaiFENesin (MUCINEX) 600 MG 12 hr tablet Take 600 mg by mouth 2 (two) times daily.    [provider]  latanoprost (XALATAN) 0.005 % ophthalmic solution Place 1 drop into both eyes at bedtime.    [provider]  loratadine (CLARITIN) 10 MG tablet Take 10 mg by mouth daily.    [provider]  Metoprolol Tartrate 37.5 MG TABS Take 1 tablet by mouth 2 (two) times daily. 09/06/21   [provider]  omeprazole (PRILOSEC) 20 MG capsule Take 20 mg by mouth daily.    [provider]  OXYGEN Inhale 2 L into the lungs daily.    [provider]  Oyster Shell 500 MG TABS Take 500 mg by mouth 2 (two) times daily.    [provider]  polyethylene glycol (MIRALAX / GLYCOLAX) 17 g packet Take 17 g  by mouth daily as needed for moderate constipation. 09/28/21   Thurnell Lose, MD  PSYLLIUM HUSK PO Take 1 capsule by mouth daily.    [provider]  sodium chloride (OCEAN) 0.65 % SOLN nasal spray Place 2 sprays into both nostrils 2 (two) times daily.    [provider]  STIOLTO RESPIMAT 2.5-2.5 MCG/ACT AERS Inhale 2 puffs into the lungs daily. 09/06/21   [provider]      Allergies    Lisinopril, Penicillins, Pravastatin, Sulfa antibiotics, Atorvastatin, Codeine, Flunisolide, Loratadine, Niacin and related, Niaspan [niacin], Simvastatin, Statins, Ciprofloxacin, Erythromycin, and Ranitidine    Review of Systems   Review of Systems  Physical  Exam Updated Vital Signs BP (!) 153/110   Pulse 83   Temp 98.7 F (37.1 C) (Oral)   Resp (!) 39   Ht 6\' 4"  (1.93 m)   Wt 82.6 kg   SpO2 95%   BMI 22.15 kg/m  Physical Exam Vitals and nursing note reviewed.  Constitutional:      General: He is not in acute distress.    Appearance: He is well-developed. He is not ill-appearing.  HENT:     Head: Normocephalic and atraumatic.     Mouth/Throat:     Mouth: Mucous membranes are moist.  Eyes:     Conjunctiva/sclera: Conjunctivae normal.     Pupils: Pupils are equal, round, and reactive to light.  Cardiovascular:     Rate and Rhythm: Normal rate and regular rhythm.     Pulses: Normal pulses.     Heart sounds: Normal heart sounds. No murmur heard. Pulmonary:     Effort: Pulmonary effort is normal. No respiratory distress.     Breath sounds: Wheezing present. No decreased breath sounds or rhonchi.  Abdominal:     Palpations: Abdomen is soft.     Tenderness: There is no abdominal tenderness.  Musculoskeletal:        General: No swelling.     Cervical back: Normal range of motion and neck supple.     Right lower leg: Edema present.     Left lower leg: Edema present.     Comments: 1+ pitting edema in his ankles  Skin:    General: Skin is warm and dry.     Capillary Refill: Capillary refill takes less than 2 seconds.  Neurological:     General: No focal deficit present.     Mental Status: He is alert.  Psychiatric:        Mood and Affect: Mood normal.     ED Results / Procedures / Treatments   Labs (all labs ordered are listed, but only abnormal results are displayed) Labs Reviewed  CBC WITH DIFFERENTIAL/PLATELET - Abnormal; Notable for the following components:      Result Value   Hemoglobin 12.2 (*)    HCT 38.7 (*)    RDW 15.9 (*)    All other components within normal limits  BASIC METABOLIC PANEL - Abnormal; Notable for the following components:   Glucose, Bld 117 (*)    GFR, Estimated 59 (*)    All other  components within normal limits  BRAIN NATRIURETIC PEPTIDE - Abnormal; Notable for the following components:   B Natriuretic Peptide 1,356.5 (*)    All other components within normal limits  TROPONIN I (HIGH SENSITIVITY) - Abnormal; Notable for the following components:   Troponin I (High Sensitivity) 85 (*)    All other components within normal limits  TROPONIN I (HIGH SENSITIVITY)  EKG EKG Interpretation  Date/Time:  Thursday October 13 2021 18:09:58 EDT Ventricular Rate:  87 PR Interval:    QRS Duration: 94 QT Interval:  376 QTC Calculation: 452 R Axis:   -23 Text Interpretation: Atrial fibrillation with premature ventricular or aberrantly conducted complexes Nonspecific ST and T wave abnormality Abnormal ECG When compared with ECG of 25-Sep-2021 08:12, PREVIOUS ECG IS PRESENT Confirmed by Lennice Sites [phone]) on 10/13/2021 6:13:34 PM  Radiology DG Chest Port 1 View  Result Date: 10/13/2021 CLINICAL DATA:  Shortness of breath EXAM: PORTABLE CHEST 1 VIEW COMPARISON:  09/28/2021 FINDINGS: Cardiomegaly with small pleural effusions. Diffuse increased interstitial and ground-glass opacities suspicious for pulmonary edema. More confluent airspace disease at the left greater than right lung base. No pneumothorax IMPRESSION: 1. Cardiomegaly with small pleural effusions and diffuse pulmonary edema 2. Patchy consolidations at the left greater than right lung base suspicious for pneumonia. Electronically Signed   By: Donavan Foil M.D.   On: 10/13/2021 18:47    Procedures Procedures    Medications Ordered in ED Medications  furosemide (LASIX) injection 60 mg (has no administration in time range)  albuterol (VENTOLIN HFA) 108 (90 Base) MCG/ACT inhaler 4 puff (4 puffs Inhalation Given 10/13/21 1849)    ED Course/ Medical Decision Making/ A&P                           Medical Decision Making Amount and/or Complexity of Data Reviewed Labs: ordered. Radiology:  ordered.  Risk Prescription drug management. Decision regarding hospitalization.   [name] is here with shortness of breath, cough.  History of A-fib on Eliquis, diabetes, hypertension, high cholesterol, COPD.  Had COVID 2 weeks ago.  Has been home from rehab for a week.  Still has dry cough.  Sometimes increased work of breathing.  Inhaler has been helping.  He has very mild wheezing on exam with good air movement.EKG per my review and interpretation shows sinus rhythm.  No ischemic changes.  Is not having any chest pain.  Differential diagnosis is possibly COPD exacerbation versus pneumonia versus new viral process.  He is on a blood thinner have very low suspicion for PE.  He has no heart failure or signs of volume overload on exam.  We will get CBC, BMP, troponin, chest x-ray and reevaluate.  Will give albuterol treatment.  Vital signs are normal.  He is got some mildly increased work of breathing, appears to have some edema to his ankles  Chest x-ray per my review and interpretation appears to be consistent with volume overload.  Upon chart review it appears that he had an echocardiogram a couple weeks ago with an EF of 35%.  He has not been on Lasix since his discharge.  Troponin is 85.  BNP is 1400.  Overall suspect volume overload as the source of his respiratory symptoms.  Will give IV Lasix and admit to medicine for further care.  This chart was dictated using voice recognition software.  Despite best efforts to proofread,  errors can occur which can change the documentation meaning.         Final Clinical Impression(s) / ED Diagnoses Final diagnoses:  Acute on chronic congestive heart failure, unspecified heart failure type [facility])    Rx / DC Orders ED Discharge Orders     None         Lennice Sites, DO 10/13/21 1957

## 2021-10-14 ENCOUNTER — Encounter (HOSPITAL_BASED_OUTPATIENT_CLINIC_OR_DEPARTMENT_OTHER): Payer: Self-pay | Admitting: Internal Medicine

## 2021-10-14 ENCOUNTER — Observation Stay (HOSPITAL_COMMUNITY): Payer: No Typology Code available for payment source

## 2021-10-14 DIAGNOSIS — I5023 Acute on chronic systolic (congestive) heart failure: Secondary | ICD-10-CM

## 2021-10-14 DIAGNOSIS — N1831 Chronic kidney disease, stage 3a: Secondary | ICD-10-CM

## 2021-10-14 DIAGNOSIS — R7989 Other specified abnormal findings of blood chemistry: Secondary | ICD-10-CM

## 2021-10-14 LAB — CBC WITH DIFFERENTIAL/PLATELET
Abs Immature Granulocytes: 0.01 10*3/uL (ref 0.00–0.07)
Basophils Absolute: 0 10*3/uL (ref 0.0–0.1)
Basophils Relative: 1 %
Eosinophils Absolute: 0 10*3/uL (ref 0.0–0.5)
Eosinophils Relative: 1 %
HCT: 37.9 % — ABNORMAL LOW (ref 39.0–52.0)
Hemoglobin: 12.3 g/dL — ABNORMAL LOW (ref 13.0–17.0)
Immature Granulocytes: 0 %
Lymphocytes Relative: 19 %
Lymphs Abs: 0.9 10*3/uL (ref 0.7–4.0)
MCH: 28 pg (ref 26.0–34.0)
MCHC: 32.5 g/dL (ref 30.0–36.0)
MCV: 86.3 fL (ref 80.0–100.0)
Monocytes Absolute: 0.5 10*3/uL (ref 0.1–1.0)
Monocytes Relative: 9 %
Neutro Abs: 3.6 10*3/uL (ref 1.7–7.7)
Neutrophils Relative %: 70 %
Platelets: 196 10*3/uL (ref 150–400)
RBC: 4.39 MIL/uL (ref 4.22–5.81)
RDW: 15.9 % — ABNORMAL HIGH (ref 11.5–15.5)
WBC: 5.1 10*3/uL (ref 4.0–10.5)
nRBC: 0 % (ref 0.0–0.2)

## 2021-10-14 LAB — PROCALCITONIN: Procalcitonin: <0.1 ng/mL

## 2021-10-14 LAB — GLUCOSE, CAPILLARY
Glucose-Capillary: 156 mg/dL — ABNORMAL HIGH (ref 70–99)
Glucose-Capillary: 140 mg/dL — ABNORMAL HIGH (ref 70–99)

## 2021-10-14 LAB — BASIC METABOLIC PANEL
Anion gap: 7 (ref 5–15)
BUN: 16 mg/dL (ref 8–23)
CO2: 28 mmol/L (ref 22–32)
Calcium: 9.2 mg/dL (ref 8.9–10.3)
Chloride: 105 mmol/L (ref 98–111)
Creatinine, Ser: 1.02 mg/dL (ref 0.61–1.24)
GFR, Estimated: >60 mL/min (ref >60)
Glucose, Bld: 165 mg/dL — ABNORMAL HIGH (ref 70–99)
Potassium: 3.6 mmol/L (ref 3.5–5.1)
Sodium: 140 mmol/L (ref 135–145)

## 2021-10-14 LAB — MAGNESIUM: Magnesium: 1.7 mg/dL (ref 1.7–2.4)

## 2021-10-14 LAB — MRSA NEXT GEN BY PCR, NASAL: MRSA by PCR Next Gen: NOT DETECTED

## 2021-10-14 LAB — TROPONIN I (HIGH SENSITIVITY): Troponin I (High Sensitivity): 74 ng/L — ABNORMAL HIGH (ref <18)

## 2021-10-14 MED ORDER — DOCUSATE SODIUM 100 MG PO CAPS: 100.0000 mg | ORAL_CAPSULE | Freq: Two times a day (BID) | ORAL | Status: DC | PRN

## 2021-10-14 MED ORDER — ARFORMOTEROL TARTRATE 15 MCG/2ML IN NEBU
15.0000 ug | INHALATION_SOLUTION | Freq: Two times a day (BID) | RESPIRATORY_TRACT | Status: DC
  Administered 2021-10-14 – 2021-10-18 (×8): 15 ug via RESPIRATORY_TRACT
  Filled 2021-10-14 (×8): qty 2

## 2021-10-14 MED ORDER — ACETAMINOPHEN 325 MG PO TABS: 650.0000 mg | ORAL_TABLET | Freq: Four times a day (QID) | ORAL | Status: DC | PRN

## 2021-10-14 MED ORDER — NITROGLYCERIN 2 % TD OINT
0.5000 [in_us] | TOPICAL_OINTMENT | Freq: Once | TRANSDERMAL | Status: AC
  Administered 2021-10-14: 0.5 [in_us] via TOPICAL
  Filled 2021-10-14: qty 1

## 2021-10-14 MED ORDER — APIXABAN 5 MG PO TABS
5.0000 mg | ORAL_TABLET | Freq: Two times a day (BID) | ORAL | Status: DC
  Administered 2021-10-14 – 2021-10-16 (×4): 5 mg via ORAL
  Filled 2021-10-14 (×4): qty 1

## 2021-10-14 MED ORDER — FUROSEMIDE 10 MG/ML IJ SOLN
40.0000 mg | Freq: Two times a day (BID) | INTRAMUSCULAR | Status: DC
  Administered 2021-10-14 – 2021-10-15 (×2): 40 mg via INTRAVENOUS
  Filled 2021-10-14 (×2): qty 4

## 2021-10-14 MED ORDER — AMLODIPINE BESYLATE 5 MG PO TABS
5.0000 mg | ORAL_TABLET | Freq: Once | ORAL | Status: AC
  Administered 2021-10-14: 5 mg via ORAL
  Filled 2021-10-14: qty 1

## 2021-10-14 MED ORDER — GABAPENTIN 300 MG PO CAPS
300.0000 mg | ORAL_CAPSULE | Freq: Three times a day (TID) | ORAL | Status: DC
  Administered 2021-10-14 – 2021-10-18 (×11): 300 mg via ORAL
  Filled 2021-10-14 (×11): qty 1

## 2021-10-14 MED ORDER — SODIUM CHLORIDE 0.9 % IV SOLN: 250.0000 mL | INTRAVENOUS | Status: DC | PRN

## 2021-10-14 MED ORDER — POTASSIUM CHLORIDE CRYS ER 20 MEQ PO TBCR
20.0000 meq | EXTENDED_RELEASE_TABLET | Freq: Once | ORAL | Status: AC
  Administered 2021-10-14: 20 meq via ORAL
  Filled 2021-10-14: qty 1

## 2021-10-14 MED ORDER — APIXABAN 2.5 MG PO TABS
2.5000 mg | ORAL_TABLET | Freq: Two times a day (BID) | ORAL | Status: DC
  Administered 2021-10-14: 2.5 mg via ORAL
  Filled 2021-10-14: qty 1

## 2021-10-14 MED ORDER — SALINE SPRAY 0.65 % NA SOLN
2.0000 | Freq: Two times a day (BID) | NASAL | Status: DC
  Administered 2021-10-14 – 2021-10-18 (×8): 2 via NASAL
  Filled 2021-10-14: qty 44

## 2021-10-14 MED ORDER — FLUTICASONE PROPIONATE 50 MCG/ACT NA SUSP
2.0000 | Freq: Every day | NASAL | Status: DC
  Administered 2021-10-14 – 2021-10-15 (×2): 2 via NASAL
  Filled 2021-10-14 (×2): qty 16

## 2021-10-14 MED ORDER — INSULIN ASPART 100 UNIT/ML IJ SOLN
0.0000 [IU] | Freq: Three times a day (TID) | INTRAMUSCULAR | Status: DC
  Administered 2021-10-14: 2 [IU] via SUBCUTANEOUS
  Administered 2021-10-15 – 2021-10-18 (×6): 1 [IU] via SUBCUTANEOUS

## 2021-10-14 MED ORDER — LATANOPROST 0.005 % OP SOLN
1.0000 [drp] | Freq: Every day | OPHTHALMIC | Status: DC
  Administered 2021-10-14 – 2021-10-17 (×4): 1 [drp] via OPHTHALMIC
  Filled 2021-10-14: qty 2.5

## 2021-10-14 MED ORDER — BUDESONIDE 0.5 MG/2ML IN SUSP
0.5000 mg | Freq: Two times a day (BID) | RESPIRATORY_TRACT | Status: DC
  Administered 2021-10-14 – 2021-10-18 (×8): 0.5 mg via RESPIRATORY_TRACT
  Filled 2021-10-14 (×8): qty 2

## 2021-10-14 MED ORDER — LORATADINE 10 MG PO TABS
10.0000 mg | ORAL_TABLET | Freq: Every day | ORAL | Status: DC
  Administered 2021-10-14 – 2021-10-18 (×5): 10 mg via ORAL
  Filled 2021-10-14 (×5): qty 1

## 2021-10-14 MED ORDER — ACETAMINOPHEN 650 MG RE SUPP: 650.0000 mg | Freq: Four times a day (QID) | RECTAL | Status: DC | PRN

## 2021-10-14 MED ORDER — SODIUM CHLORIDE 0.9% FLUSH: 3.0000 mL | INTRAVENOUS | Status: DC | PRN

## 2021-10-14 MED ORDER — METOPROLOL TARTRATE 25 MG PO TABS
37.5000 mg | ORAL_TABLET | Freq: Two times a day (BID) | ORAL | Status: DC
  Administered 2021-10-14 – 2021-10-18 (×9): 37.5 mg via ORAL
  Filled 2021-10-14 (×2): qty 1
  Filled 2021-10-14: qty 2
  Filled 2021-10-14 (×6): qty 1

## 2021-10-14 MED ORDER — SODIUM CHLORIDE 0.9% FLUSH
3.0000 mL | Freq: Two times a day (BID) | INTRAVENOUS | Status: DC
  Administered 2021-10-14 – 2021-10-18 (×8): 3 mL via INTRAVENOUS

## 2021-10-14 MED ORDER — FUROSEMIDE 10 MG/ML IJ SOLN
60.0000 mg | Freq: Four times a day (QID) | INTRAMUSCULAR | Status: DC
  Administered 2021-10-14: 60 mg via INTRAVENOUS
  Filled 2021-10-14: qty 6

## 2021-10-14 MED ORDER — PANTOPRAZOLE SODIUM 40 MG PO TBEC
40.0000 mg | DELAYED_RELEASE_TABLET | Freq: Every day | ORAL | Status: DC
  Administered 2021-10-14 – 2021-10-18 (×5): 40 mg via ORAL
  Filled 2021-10-14 (×5): qty 1

## 2021-10-14 MED ORDER — UMECLIDINIUM BROMIDE 62.5 MCG/ACT IN AEPB
1.0000 | INHALATION_SPRAY | Freq: Every day | RESPIRATORY_TRACT | Status: DC
  Administered 2021-10-15 – 2021-10-17 (×3): 1 via RESPIRATORY_TRACT
  Filled 2021-10-14: qty 7

## 2021-10-14 MED ORDER — ALBUTEROL SULFATE (2.5 MG/3ML) 0.083% IN NEBU: 2.5000 mg | INHALATION_SOLUTION | RESPIRATORY_TRACT | Status: DC | PRN

## 2021-10-14 NOTE — Progress Notes (Signed)
   10/14/21 2141  BiPAP/CPAP/SIPAP  $ Non-Invasive Home Ventilator  Initial  $ Face Mask Medium Yes  BiPAP/CPAP/SIPAP Pt Type Adult  Mask Type Nasal mask  Mask Size Medium  Respiratory Rate 16 breaths/min  Oxygen Percent 21 %  BiPAP/CPAP/SIPAP CPAP (12 cmH2O)  Auto Titrate No  BiPAP/CPAP /SiPAP Vitals  Pulse Rate (!) 121  Resp 16  SpO2 94 %  MEWS Score/Color  MEWS Score 2  MEWS Score Color Yellow   Pt place on CPAP per MD order for rest.  Per patient he uses 12 cmH2O.  Patient tolerating well at this time, RT will continue to monitor.

## 2021-10-14 NOTE — Assessment & Plan Note (Signed)
Stable, continue to monitor  ?

## 2021-10-14 NOTE — Assessment & Plan Note (Signed)
A1C 08/2021: 6.9 SSI and accuchecks QAC/HS

## 2021-10-14 NOTE — Assessment & Plan Note (Signed)
CHA2Ds2-vasc score of 7 Continue eliquis and lopressor

## 2021-10-14 NOTE — Assessment & Plan Note (Signed)
No chest pain or changes on EKG. Troponin flat and similar to previous reading Likely demand ischemia in setting of CHF exacerbation

## 2021-10-14 NOTE — TOC Initial Note (Addendum)
Transition of Care [facility]) - Initial/Assessment Note    Patient Details  Name: Jordan Hoffman MRN: [id number] Date of Birth: 09/25/1928  Transition of Care [facility]) CM/SW Contact:    Angelita Ingles, RN Phone [phone]  10/14/2021, 3:10 PM  Clinical Narrative:                  Transition of Care [facility]) Screening Note   Patient Details  Name: Jordan Hoffman Date of Birth: March 26, 1928   Transition of Care (TOC) CM/SW Contact:    Angelita Ingles, RN Phone Number: 10/14/2021, 3:11 PM    Transition of Care Department [facility]) has reviewed patient and no TOC needs have been identified at this time. We will continue to monitor patient advancement through interdisciplinary progression rounds. This is a [facility] patient CM has attempted to call to obtain [place] 's treatment information. Message has been left with April the [facility] treatment coordinator. Will update information when received.           Patient Goals and CMS Choice        Expected Discharge Plan and Services                                                Prior Living Arrangements/Services                       Activities of Daily Living Home Assistive Devices/Equipment: Cane (specify quad or straight), Walker (specify type), Shower chair with back, Raised toilet seat with rails, Eyeglasses ADL Screening (condition at time of admission) Patient's cognitive ability adequate to safely complete daily activities?: Yes Is the patient deaf or have difficulty hearing?: No Does the patient have difficulty seeing, even when wearing glasses/contacts?: No Does the patient have difficulty concentrating, remembering, or making decisions?: No Patient able to express need for assistance with ADLs?: Yes Does the patient have difficulty dressing or bathing?: No Independently performs ADLs?: Yes (appropriate for developmental age) Does the patient have difficulty walking or climbing stairs?: Yes Weakness of Legs: Both Weakness  of Arms/Hands: None  Permission Sought/Granted                  Emotional Assessment              Admission diagnosis:  Acute on chronic HFrEF (heart failure with reduced ejection fraction) (HCC) [I50.23] Acute on chronic congestive heart failure, unspecified heart failure type ([place]) [I50.9] Patient Active Problem List   Diagnosis Date Noted   Stage 3a chronic kidney disease (CKD) ([place]) 10/14/2021   Acute on chronic HFrEF (heart failure with reduced ejection fraction) ([place]) 10/13/2021   Malnutrition of moderate degree 09/27/2021   Acute CHF (congestive heart failure) ([place]) 09/25/2021   Acute respiratory failure with hypoxia ([place]) 09/25/2021   Acute HFrEF (heart failure with reduced ejection fraction) (HCC)    Elevated troponin level not due myocardial infarction    Nonobstructive atherosclerosis of coronary artery    COVID-19 virus infection    Bacterial pneumonia 07/28/2020   Hypoxia 07/28/2020   Paroxysmal atrial fibrillation ([place]) 04/09/2020   ARF (acute renal failure) ([place]) 04/09/2020   Acute renal failure superimposed on stage 3 chronic kidney disease, unspecified acute renal failure type, unspecified whether stage 3a or 3b CKD ([place]) 04/08/2020   Near syncope 12/11/2015   Atrial fibrillation ([place]) 12/11/2015  COPD (chronic obstructive pulmonary disease) ([place]) 12/11/2015   OSA (obstructive sleep apnea) 12/11/2015   Hypertension 12/11/2015   Type 2 diabetes mellitus ([place]) 12/11/2015   Hyperlipidemia 12/11/2015   Lumbosacral spondylosis without myelopathy 10/11/2015   Osteoarthritis 06/21/2015   PCP:  [place]:   [place], [place] [address] Phone: [phone] Fax: [phone]  [facility] [place], [place] [address] Phone: [phone] Fax: [phone]  [place], [place] [address] Phone: [phone] Fax: [place], [place] [address] [place] [address] Phone: [phone] Fax: [phone]     Social Determinants of Health (SDOH) Interventions    Readmission Risk Interventions     No data to display

## 2021-10-14 NOTE — Assessment & Plan Note (Addendum)
88 year old male presenting with 3 day history of worsening shortness of breath and dyspnea on exertion as well as orthopnea, leg swelling. CXR findings of diffuse pulmonary edema and BNP >1300 consistent with acute on chronic systolic CHF exacerbation -observation to telemetry  -no oxygen requirement -recent echo 09/26/2021: EF of 30-35%. Moderately decreased LVF with global hypokinesis. Diastolic parameters could not be determined.  -strict I/O and daily weights -recent admit on 9/10 for CHF exac +covid 19. Cardiology consulted at that time. He was diuresed with lasix/aldactone and discharged on PRN lasix.  -start 40mg  IV lasix BID -may need daily lasix on discharge -I do wonder if Covid also contributing. Had covid on 9/10 and could be contributing to persistent shortness of breath  -never saw cardiology for 1-2 week f/u. Discussed with on call cards and recommended IV diuresis and optimize his HF medication and they will see in f/u unless needed in hospital. Does not appear to be on farxiga/spironolactone. Was not discharged on this although recommended by cards.  -wife states dry weight is 195, but he is 181 today  -CXR with patchy consolidations at left greater than right lung bases-2 V CXR shows persistent left basilar infiltrate, small bibasilar effusions. He has no cough, leukocytosis or fevers. Similar to previous CXR. PCT pending

## 2021-10-14 NOTE — Assessment & Plan Note (Signed)
Continue cpap at night  

## 2021-10-14 NOTE — ED Notes (Signed)
[facility] called for patient transport to [place]

## 2021-10-14 NOTE — Assessment & Plan Note (Signed)
Statin allergy 

## 2021-10-14 NOTE — ED Notes (Signed)
Report given to [facility]. Receiving nurse notified of patient transport

## 2021-10-14 NOTE — ED Notes (Signed)
Per ED MD remove nitro paste and replace with Norvasc 30 minutes post nitro removal.

## 2021-10-14 NOTE — Assessment & Plan Note (Signed)
No evidence of exacerbation, lungs clear conitnue Alvesco, stiolto and SABA prn

## 2021-10-14 NOTE — H&P (Addendum)
History and Physical    Patient: Jordan Hoffman TRZ:[id number] DOB: [birth year] DOA: 10/13/2021 DOS: the patient was seen and examined on 10/14/2021 PCP: [facility] [place]  Patient coming from:  [facility]  - lives with his wife.uses walker/cane and WC   Chief Complaint: shortness of breath   HPI: Jordan Hoffman is a 88 y.o. male with medical history significant of AF on eliquis, COPD, T2DM, CKD stage 3a, ACD, OSA, HLD, HTN, recent covid infection 18 days ago, recent CVA in setting of complicated hospital stay in August for cholecystitis. Underwent lap cholecystectomy and hernia repair on 0/30/13 complicated by A-fib with RVR and NSTEMI. Code stroke on 8/14 with MRI brain showing acute left sided infarcts. Discharged to SNF. Admitted to [facility] on 9/10 for acute respiratory failure due to acute on chronic CHF exacerbation +covid 19.   Family/patient states he has been feelling short of breath since his hopsital discharge a few weeks ago, but has progressively gotten worse, especially since Wednesday. He has dyspnea on exertion, increased LE swelling and orthopnea. His wife states for some reason she did not weight him this past week.    Dry weight: 195 per wife   He does not smoke or drink.   ER Course:  vitals: afebrile, bp: 150/118, HR: 98, RR: 30, oxygen:98%RA Pertinent labs: hgb: 12.3, troponin 85>72>74, BNP: 1356.5 CXR: cardiomegaly with small pleural effusion and diffuse pulmonary edema. Patch consolidations at left greater than right lung base, suspicious for pneumonia.  In ED: given IV lasix, potassium and home lopressor, eliquis and novasc.  TRH asked to admit.     Review of Systems: As mentioned in the history of present illness. All other systems reviewed and are negative. Past Medical History:  Diagnosis Date   Atrial fibrillation (HCC)    Back pain    COPD (chronic obstructive pulmonary disease) (HCC)    Diabetes mellitus without complication ([place])    High cholesterol     Hypertension    Knee pain    Past Surgical History:  Procedure Laterality Date   BACK SURGERY     HIP SURGERY     JOINT REPLACEMENT Left    left hip replacement   MOUTH SURGERY     REPLACEMENT TOTAL KNEE Right    TRANSURETHRAL RESECTION OF PROSTATE     Social History:  reports that he has quit smoking. He has never used smokeless tobacco. He reports that he does not drink alcohol and does not use drugs.  Allergies  Allergen Reactions   Lisinopril Cough        Penicillins Other (See Comments)    Did it involve swelling of the face/tongue/throat, SOB, or low BP? Yes Did it involve sudden or severe rash/hives, skin peeling, or any reaction on the inside of your mouth or nose? No Did you need to seek medical attention at a hospital or doctor's office? No When did it last happen? Unk  If all above answers are "NO", may proceed with cephalosporin use.   *pt tolerated ancef on 08/08/16    Pravastatin Hives        Sulfa Antibiotics Itching and Other (See Comments)    Itching, watery eyes   Atorvastatin Other (See Comments)    Muscle pain    Codeine Hives        Flunisolide Hives    epistaxis Other reaction(s): Bleeding from nose, Other (See Comments) Hives  Hives     Loratadine     Hives  Other  reaction(s): Other (See Comments), Other (See Comments) Hives  Hives  Hives     Niacin And Related     Hives    Niaspan [Niacin] Hives and Other (See Comments)    Flushing, also   Simvastatin Hives    weakness Other reaction(s): Weakness present   Statins Hives    weakness Other reaction(s): Other (See Comments), Unknown Hives  weakness Hives  Hives  Hives     Ciprofloxacin Rash    Hives  Other reaction(s): Other (See Comments), Other (See Comments) Hives  Hives  Hives     Erythromycin Rash    Hives  Other reaction(s): Other (See Comments), Other (See Comments) Hives  Hives  Hives     Ranitidine Rash    Hives  Other reaction(s): Other (See Comments),  Other (See Comments) Hives  Hives  Hives      Family History  Problem Relation Age of Onset   Lung cancer Mother        Never smoked   Lung cancer Father    Stroke Sister    Lung cancer Brother    Kidney disease Brother    Heart attack Neg Hx     Prior to Admission medications   Medication Sig Start Date End Date Taking? Authorizing Provider  acetaminophen (TYLENOL) 500 MG tablet Take 1,000 mg by mouth 3 (three) times daily as needed for mild pain or moderate pain.    [provider]  albuterol (PROVENTIL) (2.5 MG/3ML) 0.083% nebulizer solution Take 2.5 mg by nebulization every 4 (four) hours as needed for wheezing or shortness of breath.    [provider]  albuterol (VENTOLIN HFA) 108 (90 Base) MCG/ACT inhaler Inhale 2 puffs into the lungs every 4 (four) hours as needed for wheezing or shortness of breath.    [provider]  ALVESCO 160 MCG/ACT inhaler Inhale 2 puffs into the lungs 2 (two) times daily. 09/07/21   [provider]  apixaban (ELIQUIS) 2.5 MG TABS tablet Take 1 tablet (2.5 mg total) by mouth 2 (two) times daily. 07/31/20   [name], MD  carboxymethylcellulose (REFRESH PLUS) 0.5 % SOLN Place 1 drop into both eyes 4 (four) times daily. 08/04/19   [provider]  cholecalciferol (VITAMIN D) 25 MCG (1000 UNIT) tablet Take 1,000 Units by mouth See admin instructions. 1 by mouth two times a week on Saturday and Sunday for supplement.    [provider]  diclofenac Sodium (VOLTAREN) 1 % GEL Apply 2 g topically 4 (four) times daily as needed (shoulder pain).    [provider]  docusate sodium (COLACE) 100 MG capsule Take 1 capsule (100 mg total) by mouth 2 (two) times daily as needed for mild constipation. 09/28/21   Thurnell Lose, MD  fluticasone (FLONASE) 50 MCG/ACT nasal spray Place 2 sprays into both nostrils daily.    [provider]  gabapentin (NEURONTIN) 300 MG capsule Take 300 mg by mouth 3  (three) times daily.    [provider]  guaiFENesin (MUCINEX) 600 MG 12 hr tablet Take 600 mg by mouth 2 (two) times daily.    [provider]  latanoprost (XALATAN) 0.005 % ophthalmic solution Place 1 drop into both eyes at bedtime.    [provider]  loratadine (CLARITIN) 10 MG tablet Take 10 mg by mouth daily.    [provider]  Metoprolol Tartrate 37.5 MG TABS Take 1 tablet by mouth 2 (two) times daily. 09/06/21   [provider]  omeprazole (PRILOSEC) 20 MG capsule Take 20 mg by mouth daily.    [provider]  OXYGEN Inhale 2 L into the lungs daily.    [provider]  Oyster Shell 500 MG TABS Take 500 mg by mouth 2 (two) times daily.    [provider]  polyethylene glycol (MIRALAX / GLYCOLAX) 17 g packet Take 17 g by mouth daily as needed for moderate constipation. 09/28/21   Thurnell Lose, MD  PSYLLIUM HUSK PO Take 1 capsule by mouth daily.    [provider]  sodium chloride (OCEAN) 0.65 % SOLN nasal spray Place 2 sprays into both nostrils 2 (two) times daily.    [provider]  STIOLTO RESPIMAT 2.5-2.5 MCG/ACT AERS Inhale 2 puffs into the lungs daily. 09/06/21   [provider]    Physical Exam: Vitals:   10/14/21 1215 10/14/21 1300 10/14/21 1419 10/14/21 1605  BP:  138/88 123/80 131/85  Pulse: (!) 119 (!) 103 (!) 104 90  Resp: (!) 21 (!) 33 (!) 26 16  Temp:   97.8 F (36.6 C) 98.1 F (36.7 C)  TempSrc:   Oral Oral  SpO2: 97% 97% 91% 91%  Weight:      Height:       General:  Appears calm and comfortable and is in NAD Eyes:  PERRL, EOMI, normal lids, iris ENT:  grossly normal hearing, lips & tongue, mmm; appropriate dentition Neck:  no LAD, masses or thyromegaly; no carotid bruits Cardiovascular:  Regularly irregular, no m/r/g. No LE edema.  Respiratory:   CTA bilaterally with no wheezes/rales/rhonchi.  Normal respiratory effort. Abdomen:  soft, NT, ND, NABS Back:    normal alignment, no CVAT Skin:  no rash or induration seen on limited exam Musculoskeletal:  grossly normal tone BUE/BLE, good ROM, no bony abnormality Lower extremity:  No LE edema.  Limited foot exam with no ulcerations.  2+ distal pulses. Psychiatric:  grossly normal mood and affect, speech fluent and appropriate, AOx3 Neurologic:  CN 2-12 grossly intact, moves all extremities in coordinated fashion, sensation intact   Radiological Exams on Admission: Independently reviewed - see discussion in A/P where applicable  DG Chest 2 View  Result Date: 10/14/2021 CLINICAL DATA:  Pneumonia EXAM: CHEST - 2 VIEW COMPARISON:  10/13/2021 FINDINGS: Enlargement of cardiac silhouette. Mediastinal contours and pulmonary vascularity normal. Atherosclerotic calcification aorta. Persistent hazy infiltrate LEFT lung greatest at base. Improved RIGHT basilar aeration. Persistent bibasilar effusions and atelectasis. No pneumothorax. LEFT glenohumeral degenerative changes. IMPRESSION: Persistent LEFT basilar infiltrate, small bibasilar effusions and basilar atelectasis. Improved aeration at RIGHT base since previous study. Enlargement of cardiac silhouette. Aortic Atherosclerosis (ICD10-I70.0). Electronically Signed   By: Lavonia Dana M.D.   On: 10/14/2021 17:45   DG Chest Port 1 View  Result Date: 10/13/2021 CLINICAL DATA:  Shortness of breath EXAM: PORTABLE CHEST 1 VIEW COMPARISON:  09/28/2021 FINDINGS: Cardiomegaly with small pleural effusions. Diffuse increased interstitial and ground-glass opacities suspicious for pulmonary edema. More confluent airspace disease at the left greater than right lung base. No pneumothorax IMPRESSION: 1. Cardiomegaly with small pleural effusions and diffuse pulmonary edema 2. Patchy consolidations at the left greater than right lung base suspicious for pneumonia. Electronically Signed   By: Donavan Foil M.D.   On: 10/13/2021 18:47    EKG: Independently reviewed.  Atrial fibrillation  with rate 87 with PVC; nonspecific ST changes with no evidence of acute ischemia   Labs on Admission: I have personally reviewed the available labs and  imaging studies at the time of the admission.  Pertinent labs:    hgb: 12.3,  troponin 85>72>74,  BNP: 1356.5  Assessment and Plan: Principal Problem:   Acute on chronic HFrEF (heart failure with reduced ejection fraction) (HCC) Active Problems:   Atrial fibrillation (HCC)   Elevated troponin   COPD (chronic obstructive pulmonary disease) (HCC)   Hypertension   Stage 3a chronic kidney disease (CKD) (HCC)   Type 2 diabetes mellitus (HCC)   Hyperlipidemia   OSA (obstructive sleep apnea)    Assessment and Plan: * Acute on chronic HFrEF (heart failure with reduced ejection fraction) ([place]) 88 year old male presenting with 3 day history of worsening shortness of breath and dyspnea on exertion as well as orthopnea, leg swelling. CXR findings of diffuse pulmonary edema and BNP >1300 consistent with acute on chronic systolic CHF exacerbation -observation to telemetry  -no oxygen requirement -recent echo 09/26/2021: EF of 30-35%. Moderately decreased LVF with global hypokinesis. Diastolic parameters could not be determined.  -strict I/O and daily weights -recent admit on 9/10 for CHF exac +covid 19. Cardiology consulted at that time. He was diuresed with lasix/aldactone and discharged on PRN lasix.  -start 40mg  IV lasix BID -may need daily lasix on discharge -I do wonder if Covid also contributing. Had covid on 9/10 and could be contributing to persistent shortness of breath  -never saw cardiology for 1-2 week f/u. Discussed with on call cards and recommended IV diuresis and optimize his HF medication and they will see in f/u unless needed in hospital. Does not appear to be on farxiga/spironolactone. Was not discharged on this although recommended by cards.  -wife states dry weight is 195, but he is 181 today  -CXR with patchy consolidations  at left greater than right lung bases-2 V CXR shows persistent left basilar infiltrate, small bibasilar effusions. He has no cough, leukocytosis or fevers. Similar to previous CXR. PCT pending   Atrial fibrillation (HCC) CHA2Ds2-vasc score of 7 Continue eliquis and lopressor   Elevated troponin No chest pain or changes on EKG. Troponin flat and similar to previous reading Likely demand ischemia in setting of CHF exacerbation   COPD (chronic obstructive pulmonary disease) (HCC) No evidence of exacerbation, lungs clear conitnue Alvesco, stiolto and SABA prn   Hypertension Well controlled Continue lopressor 27.5mg  BID   Stage 3a chronic kidney disease (CKD) (HCC) Stable, continue to monitor   Type 2 diabetes mellitus ([place]) A1C 08/2021: 6.9 SSI and accuchecks QAC/HS  Hyperlipidemia Statin allergy   OSA (obstructive sleep apnea) Continue cpap at night     Advance Care Planning:   Code Status: Full Code   Consults: none   DVT Prophylaxis: eliquis   Family Communication: wife and children at bedside.   Severity of Illness: The appropriate patient status for this patient is OBSERVATION. Observation status is judged to be reasonable and necessary in order to provide the required intensity of service to ensure the patient's safety. The patient's presenting symptoms, physical exam findings, and initial radiographic and laboratory data in the context of their medical condition is felt to place them at decreased risk for further clinical deterioration. Furthermore, it is anticipated that the patient will be medically stable for discharge from the hospital within 2 midnights of admission.   Author: Orma Flaming, MD 10/14/2021 7:26 PM  For on call review [email or web address].

## 2021-10-14 NOTE — ED Notes (Signed)
Dr. Oswald Hillock notified of patients continued hypertension. Medications ordered.

## 2021-10-14 NOTE — ED Notes (Signed)
Pt eating applesauce at this time. States feeling much better

## 2021-10-14 NOTE — Assessment & Plan Note (Signed)
Well controlled Continue lopressor 27.5mg  BID

## 2021-10-14 NOTE — ED Notes (Addendum)
Nitro paste removed at this time per MD order

## 2021-10-14 NOTE — ED Notes (Signed)
Pt offered food, denies needs at this time

## 2021-10-15 DIAGNOSIS — D649 Anemia, unspecified: Secondary | ICD-10-CM | POA: Diagnosis not present

## 2021-10-15 DIAGNOSIS — R1310 Dysphagia, unspecified: Secondary | ICD-10-CM | POA: Diagnosis present

## 2021-10-15 DIAGNOSIS — I2489 Other forms of acute ischemic heart disease: Secondary | ICD-10-CM | POA: Diagnosis present

## 2021-10-15 DIAGNOSIS — G4733 Obstructive sleep apnea (adult) (pediatric): Secondary | ICD-10-CM | POA: Diagnosis present

## 2021-10-15 DIAGNOSIS — N1831 Chronic kidney disease, stage 3a: Secondary | ICD-10-CM | POA: Diagnosis present

## 2021-10-15 DIAGNOSIS — Z794 Long term (current) use of insulin: Secondary | ICD-10-CM | POA: Diagnosis not present

## 2021-10-15 DIAGNOSIS — Z87891 Personal history of nicotine dependence: Secondary | ICD-10-CM | POA: Diagnosis not present

## 2021-10-15 DIAGNOSIS — Z7901 Long term (current) use of anticoagulants: Secondary | ICD-10-CM | POA: Diagnosis not present

## 2021-10-15 DIAGNOSIS — N179 Acute kidney failure, unspecified: Secondary | ICD-10-CM | POA: Diagnosis not present

## 2021-10-15 DIAGNOSIS — I130 Hypertensive heart and chronic kidney disease with heart failure and stage 1 through stage 4 chronic kidney disease, or unspecified chronic kidney disease: Secondary | ICD-10-CM | POA: Diagnosis present

## 2021-10-15 DIAGNOSIS — Z8616 Personal history of COVID-19: Secondary | ICD-10-CM | POA: Diagnosis not present

## 2021-10-15 DIAGNOSIS — E7800 Pure hypercholesterolemia, unspecified: Secondary | ICD-10-CM | POA: Diagnosis present

## 2021-10-15 DIAGNOSIS — Z8719 Personal history of other diseases of the digestive system: Secondary | ICD-10-CM | POA: Diagnosis not present

## 2021-10-15 DIAGNOSIS — I5043 Acute on chronic combined systolic (congestive) and diastolic (congestive) heart failure: Secondary | ICD-10-CM | POA: Diagnosis present

## 2021-10-15 DIAGNOSIS — I480 Paroxysmal atrial fibrillation: Secondary | ICD-10-CM | POA: Diagnosis present

## 2021-10-15 DIAGNOSIS — I5023 Acute on chronic systolic (congestive) heart failure: Secondary | ICD-10-CM | POA: Diagnosis present

## 2021-10-15 DIAGNOSIS — Z79899 Other long term (current) drug therapy: Secondary | ICD-10-CM | POA: Diagnosis not present

## 2021-10-15 DIAGNOSIS — E1122 Type 2 diabetes mellitus with diabetic chronic kidney disease: Secondary | ICD-10-CM | POA: Diagnosis present

## 2021-10-15 DIAGNOSIS — Z8673 Personal history of transient ischemic attack (TIA), and cerebral infarction without residual deficits: Secondary | ICD-10-CM | POA: Diagnosis not present

## 2021-10-15 DIAGNOSIS — J449 Chronic obstructive pulmonary disease, unspecified: Secondary | ICD-10-CM | POA: Diagnosis present

## 2021-10-15 DIAGNOSIS — Z888 Allergy status to other drugs, medicaments and biological substances status: Secondary | ICD-10-CM | POA: Diagnosis not present

## 2021-10-15 DIAGNOSIS — Z9049 Acquired absence of other specified parts of digestive tract: Secondary | ICD-10-CM | POA: Diagnosis not present

## 2021-10-15 DIAGNOSIS — Z8701 Personal history of pneumonia (recurrent): Secondary | ICD-10-CM | POA: Diagnosis not present

## 2021-10-15 DIAGNOSIS — Z66 Do not resuscitate: Secondary | ICD-10-CM | POA: Diagnosis not present

## 2021-10-15 DIAGNOSIS — I252 Old myocardial infarction: Secondary | ICD-10-CM | POA: Diagnosis not present

## 2021-10-15 LAB — CBC
HCT: 38.4 % — ABNORMAL LOW (ref 39.0–52.0)
Hemoglobin: 12.4 g/dL — ABNORMAL LOW (ref 13.0–17.0)
MCH: 28.6 pg (ref 26.0–34.0)
MCHC: 32.3 g/dL (ref 30.0–36.0)
MCV: 88.5 fL (ref 80.0–100.0)
Platelets: 207 10*3/uL (ref 150–400)
RBC: 4.34 MIL/uL (ref 4.22–5.81)
RDW: 15.7 % — ABNORMAL HIGH (ref 11.5–15.5)
WBC: 5.8 10*3/uL (ref 4.0–10.5)
nRBC: 0 % (ref 0.0–0.2)

## 2021-10-15 LAB — BASIC METABOLIC PANEL
Anion gap: 10 (ref 5–15)
BUN: 14 mg/dL (ref 8–23)
CO2: 31 mmol/L (ref 22–32)
Calcium: 9.5 mg/dL (ref 8.9–10.3)
Chloride: 100 mmol/L (ref 98–111)
Creatinine, Ser: 1.38 mg/dL — ABNORMAL HIGH (ref 0.61–1.24)
GFR, Estimated: 48 mL/min — ABNORMAL LOW (ref >60)
Glucose, Bld: 128 mg/dL — ABNORMAL HIGH (ref 70–99)
Potassium: 3.7 mmol/L (ref 3.5–5.1)
Sodium: 141 mmol/L (ref 135–145)

## 2021-10-15 LAB — GLUCOSE, CAPILLARY
Glucose-Capillary: 121 mg/dL — ABNORMAL HIGH (ref 70–99)
Glucose-Capillary: 126 mg/dL — ABNORMAL HIGH (ref 70–99)
Glucose-Capillary: 134 mg/dL — ABNORMAL HIGH (ref 70–99)
Glucose-Capillary: 168 mg/dL — ABNORMAL HIGH (ref 70–99)

## 2021-10-15 LAB — MAGNESIUM: Magnesium: 1.6 mg/dL — ABNORMAL LOW (ref 1.7–2.4)

## 2021-10-15 MED ORDER — FUROSEMIDE 10 MG/ML IJ SOLN: 40.0000 mg | Freq: Every day | INTRAMUSCULAR | Status: DC

## 2021-10-15 NOTE — Progress Notes (Signed)
Mobility Specialist Progress Note:   10/15/21 1500  Mobility  Activity Ambulated with assistance in hallway  Level of Assistance Standby assist, set-up cues, supervision of patient - no hands on  Assistive Device Front wheel walker  Distance Ambulated (ft) 80 ft  Activity Response Tolerated well  $Mobility charge 1 Mobility   Pt received in bed and agreeable. No complaints. Pt left sitting in chair with all needs met, call bell in reach, and family in room.   Isabelly Kobler Mobility Specialist-Acute Rehab Secure Chat only

## 2021-10-15 NOTE — Progress Notes (Signed)
PROGRESS NOTE    Jordan Hoffman  NIO:[id number] DOB: October 11, 1928 DOA: 10/13/2021 PCP: [facility] [place]   Brief Narrative:  Jordan Hoffman is a 88 y.o. male with medical history significant of AF on eliquis, COPD, T2DM, CKD stage 3a, ACD, OSA, HLD, HTN, recent covid infection 18 days ago, recent CVA in setting of complicated hospital stay in August for cholecystitis. Underwent lap cholecystectomy and hernia repair on 09/03/27 complicated by A-fib with RVR and NSTEMI. Code stroke on 8/14 with MRI brain showing acute left sided infarcts. Discharged to SNF. Admitted to [facility] on 9/10 for acute respiratory failure due to acute on chronic CHF exacerbation +covid 19.  He admitted in the hospital with shortness of breath since 3 days associated with lower extremity swelling and orthopnea.  ER Course:  vitals: afebrile, bp: 150/118, HR: 98, RR: 30, oxygen:98%RA Pertinent labs: hgb: 12.3, troponin 85>72>74, BNP: 1356.5 CXR: cardiomegaly with small pleural effusion and diffuse pulmonary edema. Patch consolidations at left greater than right lung base, suspicious for pneumonia.  In ED: given IV lasix, potassium and home lopressor, eliquis and novasc.  TRH asked to admit.   Assessment & Plan:   Acute on chronic combined congestive heart failure: -Echo from 09/26/2021 showed ejection fraction of 30 to 35%, moderate decreased LVEF with global hypokinesis and diastolic parameters could not be determined. -BNP more than 13.  Chest x-ray findings of diffuse pulmonary edema. -Patient started on Lasix 40 IV twice daily -Strict INO's and daily weight.  Monitor electrolytes -Keep potassium above 4, magnesium above 2 -He is -3 pounds and -6 L since admission  -We will decrease Lasix dose from twice daily to once daily today.  We will continue to monitor kidney function closely  Paroxysmal A-fib: CHA2DS2-VASc score: 7 -Continue Eliquis and Lopressor  Elevated troponin: Likely demand ischemia in the setting of CHF  exacerbation -Denies any ACS symptoms.  Continue to monitor closely on telemetry  COPD: On room air.  No evidence of exacerbation.  Continue home inhalers  Hypertension: Stable.  Continue Lopressor  AKI on CKD stage IIIa: -Creatinine trended up to 1.3.  Decreased-Lasix dose today.  Continue to monitor kidney function closely  Well-controlled type 2 diabetes mellitus: Last A1c 6.9 checked on 8/23.  Continue sliding scale insulin  Hyperlipidemia: He is allergic to statins  OSA: Continue CPAP at bedtime  History of CVA: -Continue Eliquis  Normocytic anemia: Hemoglobin is at baseline.  Continue to monitor  DVT prophylaxis: Eliquis Code Status: Full code Family Communication:  None present at bedside.  Plan of care discussed with patient in length and he verbalized understanding and agreed with it. I called patient's wife and discussed plan of care and she verbalized understanding  Disposition Plan: Likely home tomorrow  Consultants:  None  Procedures:  None  Antimicrobials:  None  Status is: Observation    Subjective: Patient seen and examined.  Resting comfortably on the bed.  Reports improvement in his breathing and leg edema.  No chest pain.  No acute events overnight.  Objective: Vitals:   10/14/21 2300 10/15/21 0330 10/15/21 0655 10/15/21 0744  BP: 132/85 (!) 142/98  (!) 130/94  Pulse: 98 100    Resp: 15 20    Temp: 98.8 F (37.1 C) 98.1 F (36.7 C)  98.5 F (36.9 C)  TempSrc: Oral Oral  Oral  SpO2: 93% 96%  97%  Weight:   79.7 kg   Height:        Intake/Output Summary (Last  24 hours) at 10/15/2021 1101 Last data filed at 10/15/2021 0842 Gross per 24 hour  Intake 240 ml  Output 2075 ml  Net -1835 ml   Filed Weights   10/13/21 1803 10/15/21 0655  Weight: 82.6 kg 79.7 kg    Examination:  General exam: Appears calm and comfortable, on room air, communicating well Respiratory system: Clear to auscultation. Respiratory effort  normal. Cardiovascular system: S1 & S2 heard, RRR. No JVD, murmurs, rubs, gallops or clicks. No pedal edema. Gastrointestinal system: Abdomen is nondistended, soft and nontender. No organomegaly or masses felt. Normal bowel sounds heard. Central nervous system: Alert and oriented. No focal neurological deficits. Extremities: Symmetric 5 x 5 power. Skin: No rashes, lesions or ulcers Psychiatry: Judgement and insight appear normal. Mood & affect appropriate.    Data Reviewed: I have personally reviewed following labs and imaging studies  CBC: Recent Labs  Lab 10/13/21 1823 10/14/21 0748 10/15/21 0048  WBC 6.7 5.1 5.8  NEUTROABS 4.7 3.6  --   HGB 12.2* 12.3* 12.4*  HCT 38.7* 37.9* 38.4*  MCV 88.6 86.3 88.5  PLT 205 196 580   Basic Metabolic Panel: Recent Labs  Lab 10/13/21 1823 10/14/21 0748 10/14/21 1627 10/15/21 0048  NA 138 140  --  141  K 4.0 3.6  --  3.7  CL 105 105  --  100  CO2 25 28  --  31  GLUCOSE 117* 165*  --  128*  BUN 17 16  --  14  CREATININE 1.16 1.02  --  1.38*  CALCIUM 9.4 9.2  --  9.5  MG  --   --  1.7  --    GFR: Estimated Creatinine Clearance: 39.3 mL/min (A) (by C-G formula based on SCr of 1.38 mg/dL (H)). Liver Function Tests: No results for input(s): "AST", "ALT", "ALKPHOS", "BILITOT", "PROT", "ALBUMIN" in the last 168 hours. No results for input(s): "LIPASE", "AMYLASE" in the last 168 hours. No results for input(s): "AMMONIA" in the last 168 hours. Coagulation Profile: No results for input(s): "INR", "PROTIME" in the last 168 hours. Cardiac Enzymes: No results for input(s): "CKTOTAL", "CKMB", "CKMBINDEX", "TROPONINI" in the last 168 hours. BNP (last 3 results) No results for input(s): "PROBNP" in the last 8760 hours. HbA1C: No results for input(s): "HGBA1C" in the last 72 hours. CBG: Recent Labs  Lab 10/14/21 1608 10/14/21 2106 10/15/21 0636  GLUCAP 156* 140* 121*   Lipid Profile: No results for input(s): "CHOL", "HDL", "LDLCALC",  "TRIG", "CHOLHDL", "LDLDIRECT" in the last 72 hours. Thyroid Function Tests: No results for input(s): "TSH", "T4TOTAL", "FREET4", "T3FREE", "THYROIDAB" in the last 72 hours. Anemia Panel: No results for input(s): "VITAMINB12", "FOLATE", "FERRITIN", "TIBC", "IRON", "RETICCTPCT" in the last 72 hours. Sepsis Labs: Recent Labs  Lab 10/14/21 2041  PROCALCITON <0.10    Recent Results (from the past 240 hour(s))  MRSA Next Gen by PCR, Nasal     Status: None   Collection Time: 10/14/21  9:20 PM   Specimen: Nasal Mucosa; Nasal Swab  Result Value Ref Range Status   MRSA by PCR Next Gen NOT DETECTED NOT DETECTED Final    Comment: (NOTE) The GeneXpert MRSA Assay (FDA approved for NASAL specimens only), is one component of a comprehensive MRSA colonization surveillance program. It is not intended to diagnose MRSA infection nor to guide or monitor treatment for MRSA infections. Test performance is not FDA approved in patients less than 53 years old. Performed at [facility], [place] [address], [place], [place]  [address]       Radiology Studies: DG Chest 2 View  Result Date: 10/14/2021 CLINICAL DATA:  Pneumonia EXAM: CHEST - 2 VIEW COMPARISON:  10/13/2021 FINDINGS: Enlargement of cardiac silhouette. Mediastinal contours and pulmonary vascularity normal. Atherosclerotic calcification aorta. Persistent hazy infiltrate LEFT lung greatest at base. Improved RIGHT basilar aeration. Persistent bibasilar effusions and atelectasis. No pneumothorax. LEFT glenohumeral degenerative changes. IMPRESSION: Persistent LEFT basilar infiltrate, small bibasilar effusions and basilar atelectasis. Improved aeration at RIGHT base since previous study. Enlargement of cardiac silhouette. Aortic Atherosclerosis (ICD10-I70.0). Electronically Signed   By: Lavonia Dana M.D.   On: 10/14/2021 17:45   DG Chest Port 1 View  Result Date: 10/13/2021 CLINICAL DATA:  Shortness of breath EXAM: PORTABLE CHEST 1 VIEW COMPARISON:   09/28/2021 FINDINGS: Cardiomegaly with small pleural effusions. Diffuse increased interstitial and ground-glass opacities suspicious for pulmonary edema. More confluent airspace disease at the left greater than right lung base. No pneumothorax IMPRESSION: 1. Cardiomegaly with small pleural effusions and diffuse pulmonary edema 2. Patchy consolidations at the left greater than right lung base suspicious for pneumonia. Electronically Signed   By: Donavan Foil M.D.   On: 10/13/2021 18:47    Scheduled Meds:  apixaban  5 mg Oral BID   arformoterol  15 mcg Nebulization BID   And   umeclidinium bromide  1 puff Inhalation Daily   budesonide  0.5 mg Nebulization BID   [START ON 10/16/2021] furosemide  40 mg Intravenous Daily   gabapentin  300 mg Oral TID   insulin aspart  0-9 Units Subcutaneous TID WC   latanoprost  1 drop Both Eyes QHS   loratadine  10 mg Oral Daily   metoprolol tartrate  37.5 mg Oral BID   pantoprazole  40 mg Oral Daily   sodium chloride  2 spray Each Nare BID   sodium chloride flush  3 mL Intravenous Q12H   Continuous Infusions:  sodium chloride       LOS: 0 days   Time spent: 35 minutes   Kairo Laubacher Loann Quill, MD [facility]  If 7PM-7AM, please contact night-coverage [email or web address] 10/15/2021, 11:01 AM

## 2021-10-16 DIAGNOSIS — I5023 Acute on chronic systolic (congestive) heart failure: Secondary | ICD-10-CM | POA: Diagnosis not present

## 2021-10-16 LAB — MAGNESIUM: Magnesium: 1.7 mg/dL (ref 1.7–2.4)

## 2021-10-16 LAB — GLUCOSE, CAPILLARY
Glucose-Capillary: 112 mg/dL — ABNORMAL HIGH (ref 70–99)
Glucose-Capillary: 144 mg/dL — ABNORMAL HIGH (ref 70–99)
Glucose-Capillary: 130 mg/dL — ABNORMAL HIGH (ref 70–99)
Glucose-Capillary: 162 mg/dL — ABNORMAL HIGH (ref 70–99)

## 2021-10-16 LAB — BASIC METABOLIC PANEL
Anion gap: 8 (ref 5–15)
BUN: 18 mg/dL (ref 8–23)
CO2: 31 mmol/L (ref 22–32)
Calcium: 9.2 mg/dL (ref 8.9–10.3)
Chloride: 99 mmol/L (ref 98–111)
Creatinine, Ser: 1.64 mg/dL — ABNORMAL HIGH (ref 0.61–1.24)
GFR, Estimated: 39 mL/min — ABNORMAL LOW (ref >60)
Glucose, Bld: 152 mg/dL — ABNORMAL HIGH (ref 70–99)
Potassium: 3.5 mmol/L (ref 3.5–5.1)
Sodium: 138 mmol/L (ref 135–145)

## 2021-10-16 MED ORDER — FUROSEMIDE 20 MG PO TABS
20.0000 mg | ORAL_TABLET | Freq: Every day | ORAL | Status: DC
  Administered 2021-10-16: 20 mg via ORAL
  Filled 2021-10-16: qty 1

## 2021-10-16 MED ORDER — APIXABAN 2.5 MG PO TABS
2.5000 mg | ORAL_TABLET | Freq: Two times a day (BID) | ORAL | Status: DC
  Administered 2021-10-16 – 2021-10-18 (×4): 2.5 mg via ORAL
  Filled 2021-10-16 (×4): qty 1

## 2021-10-16 NOTE — Progress Notes (Signed)
PROGRESS NOTE    Jordan Hoffman  VZD:[id number] DOB: December 18, 1928 DOA: 10/13/2021 PCP: [facility] [place]   Brief Narrative:  Jordan Hoffman is a 88 y.o. male with medical history significant of AF on eliquis, COPD, T2DM, CKD stage 3a, ACD, OSA, HLD, HTN, recent covid infection 18 days ago, recent CVA in setting of complicated hospital stay in August for cholecystitis. Underwent lap cholecystectomy and hernia repair on 2/95/18 complicated by A-fib with RVR and NSTEMI. Code stroke on 8/14 with MRI brain showing acute left sided infarcts. Discharged to SNF. Admitted to [facility] on 9/10 for acute respiratory failure due to acute on chronic CHF exacerbation +covid 19.  He admitted in the hospital with shortness of breath since 3 days associated with lower extremity swelling and orthopnea.  ER Course:  vitals: afebrile, bp: 150/118, HR: 98, RR: 30, oxygen:98%RA Pertinent labs: hgb: 12.3, troponin 85>72>74, BNP: 1356.5 CXR: cardiomegaly with small pleural effusion and diffuse pulmonary edema. Patch consolidations at left greater than right lung base, suspicious for pneumonia.  In ED: given IV lasix, potassium and home lopressor, eliquis and novasc.  TRH asked to admit.   Assessment & Plan:   Acute on chronic combined congestive heart failure: -Echo from 09/26/2021 showed ejection fraction of 30 to 35%, moderate decreased LVEF with global hypokinesis and diastolic parameters could not be determined. -BNP 1356.  Chest x-ray findings of diffuse pulmonary edema. -On admission: Patient started on Lasix 40 IV twice daily -Strict INO's and daily weight.  -Keep potassium above 4, magnesium above 2.  Electrolyte replenished -He is -3 pounds and -7 L since admission-at baseline weight  -Switched IV Lasix to p.o. and decrease the dose from 40 mg to 20 mg due to increasing creatinine  Paroxysmal A-fib: CHA2DS2-VASc score: 7 -Continue Eliquis and Lopressor  Elevated troponin: Likely demand ischemia in the setting  of CHF exacerbation -Denies any ACS symptoms.  Continue to monitor closely on telemetry  COPD: On room air.  No evidence of exacerbation.  Continue home inhalers  Hypertension: Stable.  Continue Lopressor  AKI on CKD stage IIIa: -Creatinine trended up to 1.6.  -Need to monitor kidney function closely  Well-controlled type 2 diabetes mellitus: Last A1c 6.9 checked on 8/23.  Continue sliding scale insulin  Hyperlipidemia: He is allergic to statins  OSA: Continue CPAP at bedtime  History of CVA: -Continue Eliquis  Normocytic anemia: Hemoglobin is at baseline.  Continue to monitor  DVT prophylaxis: Eliquis Code Status: Full code Family Communication:  None present at bedside.  Plan of care discussed with patient in length and he verbalized understanding and agreed with it.  Disposition Plan: Likely home tomorrow  Consultants:  None  Procedures:  None  Antimicrobials:  None  Status is: Observation    Subjective: Patient seen and examined.  Wife at the bedside.  No new complaints.  Breathing has improved.  Wife is concerned about worsening kidney function.  He has an appointment with new cardiology in [place] on October 12.  Objective: Vitals:   10/16/21 0326 10/16/21 0619 10/16/21 0756 10/16/21 0800  BP: (!) 134/97   (!) 145/77  Pulse: 87   87  Resp: (!) 22   20  Temp: 98.6 F (37 C)   98.6 F (37 C)  TempSrc: Oral   Oral  SpO2: 95%  96% 97%  Weight:  79.8 kg    Height:        Intake/Output Summary (Last 24 hours) at 10/16/2021 1106 Last data filed  at 10/16/2021 0800 Gross per 24 hour  Intake 480 ml  Output 1250 ml  Net -770 ml    Filed Weights   10/13/21 1803 10/15/21 0655 10/16/21 0619  Weight: 82.6 kg 79.7 kg 79.8 kg    Examination:  General exam: Appears calm and comfortable, on room air, communicating well Respiratory system: Clear to auscultation. Respiratory effort normal. Cardiovascular system: S1 & S2 heard, RRR. No JVD, murmurs,  rubs, gallops or clicks. No pedal edema. Gastrointestinal system: Abdomen is nondistended, soft and nontender. No organomegaly or masses felt. Normal bowel sounds heard. Central nervous system: Alert and oriented. No focal neurological deficits. Extremities: Symmetric 5 x 5 power. Skin: No rashes, lesions or ulcers Psychiatry: Judgement and insight appear normal. Mood & affect appropriate.    Data Reviewed: I have personally reviewed following labs and imaging studies  CBC: Recent Labs  Lab 10/13/21 1823 10/14/21 0748 10/15/21 0048  WBC 6.7 5.1 5.8  NEUTROABS 4.7 3.6  --   HGB 12.2* 12.3* 12.4*  HCT 38.7* 37.9* 38.4*  MCV 88.6 86.3 88.5  PLT 205 196 425    Basic Metabolic Panel: Recent Labs  Lab 10/13/21 1823 10/14/21 0748 10/14/21 1627 10/15/21 0048 10/15/21 0111 10/16/21 0034  NA 138 140  --  141  --  138  K 4.0 3.6  --  3.7  --  3.5  CL 105 105  --  100  --  99  CO2 25 28  --  31  --  31  GLUCOSE 117* 165*  --  128*  --  152*  BUN 17 16  --  14  --  18  CREATININE 1.16 1.02  --  1.38*  --  1.64*  CALCIUM 9.4 9.2  --  9.5  --  9.2  MG  --   --  1.7  --  1.6* 1.7    GFR: Estimated Creatinine Clearance: 33.1 mL/min (A) (by C-G formula based on SCr of 1.64 mg/dL (H)). Liver Function Tests: No results for input(s): "AST", "ALT", "ALKPHOS", "BILITOT", "PROT", "ALBUMIN" in the last 168 hours. No results for input(s): "LIPASE", "AMYLASE" in the last 168 hours. No results for input(s): "AMMONIA" in the last 168 hours. Coagulation Profile: No results for input(s): "INR", "PROTIME" in the last 168 hours. Cardiac Enzymes: No results for input(s): "CKTOTAL", "CKMB", "CKMBINDEX", "TROPONINI" in the last 168 hours. BNP (last 3 results) No results for input(s): "PROBNP" in the last 8760 hours. HbA1C: No results for input(s): "HGBA1C" in the last 72 hours. CBG: Recent Labs  Lab 10/15/21 0636 10/15/21 1125 10/15/21 1624 10/15/21 2227 10/16/21 0615  GLUCAP 121* 126*  134* 168* 112*    Lipid Profile: No results for input(s): "CHOL", "HDL", "LDLCALC", "TRIG", "CHOLHDL", "LDLDIRECT" in the last 72 hours. Thyroid Function Tests: No results for input(s): "TSH", "T4TOTAL", "FREET4", "T3FREE", "THYROIDAB" in the last 72 hours. Anemia Panel: No results for input(s): "VITAMINB12", "FOLATE", "FERRITIN", "TIBC", "IRON", "RETICCTPCT" in the last 72 hours. Sepsis Labs: Recent Labs  Lab 10/14/21 2041  PROCALCITON <0.10     Recent Results (from the past 240 hour(s))  MRSA Next Gen by PCR, Nasal     Status: None   Collection Time: 10/14/21  9:20 PM   Specimen: Nasal Mucosa; Nasal Swab  Result Value Ref Range Status   MRSA by PCR Next Gen NOT DETECTED NOT DETECTED Final    Comment: (NOTE) The GeneXpert MRSA Assay (FDA approved for NASAL specimens only), is one component of a comprehensive MRSA  colonization surveillance program. It is not intended to diagnose MRSA infection nor to guide or monitor treatment for MRSA infections. Test performance is not FDA approved in patients less than 67 years old. Performed at [facility], [place] [address] [place], [place] [address]       Radiology Studies: DG Chest 2 View  Result Date: 10/14/2021 CLINICAL DATA:  Pneumonia EXAM: CHEST - 2 VIEW COMPARISON:  10/13/2021 FINDINGS: Enlargement of cardiac silhouette. Mediastinal contours and pulmonary vascularity normal. Atherosclerotic calcification aorta. Persistent hazy infiltrate LEFT lung greatest at base. Improved RIGHT basilar aeration. Persistent bibasilar effusions and atelectasis. No pneumothorax. LEFT glenohumeral degenerative changes. IMPRESSION: Persistent LEFT basilar infiltrate, small bibasilar effusions and basilar atelectasis. Improved aeration at RIGHT base since previous study. Enlargement of cardiac silhouette. Aortic Atherosclerosis (ICD10-I70.0). Electronically Signed   By: Lavonia Dana M.D.   On: 10/14/2021 17:45    Scheduled Meds:  apixaban  5 mg  Oral BID   arformoterol  15 mcg Nebulization BID   And   umeclidinium bromide  1 puff Inhalation Daily   budesonide  0.5 mg Nebulization BID   furosemide  20 mg Oral Daily   gabapentin  300 mg Oral TID   insulin aspart  0-9 Units Subcutaneous TID WC   latanoprost  1 drop Both Eyes QHS   loratadine  10 mg Oral Daily   metoprolol tartrate  37.5 mg Oral BID   pantoprazole  40 mg Oral Daily   sodium chloride  2 spray Each Nare BID   sodium chloride flush  3 mL Intravenous Q12H   Continuous Infusions:  sodium chloride       LOS: 1 day   Time spent: 35 minutes   Grabiela Wohlford Loann Quill, MD [facility]  If 7PM-7AM, please contact night-coverage [email or web address] 10/16/2021, 11:06 AM

## 2021-10-16 NOTE — Progress Notes (Signed)
Mobility Specialist Progress Note:   10/16/21 1313  Mobility  Activity Ambulated with assistance in hallway  Level of Assistance Standby assist, set-up cues, supervision of patient - no hands on  Assistive Device Front wheel walker  Distance Ambulated (ft) 100 ft  Activity Response Tolerated well  $Mobility charge 1 Mobility   Pt received in bed requesting assistance to bathroom. Eager to mobilize after BM. No complaints. Pt left in bed with all needs met and call bell in reach.   Elester Apodaca Mobility Specialist-Acute Rehab Secure Chat only

## 2021-10-17 ENCOUNTER — Inpatient Hospital Stay (HOSPITAL_COMMUNITY): Payer: No Typology Code available for payment source

## 2021-10-17 DIAGNOSIS — I5023 Acute on chronic systolic (congestive) heart failure: Secondary | ICD-10-CM | POA: Diagnosis not present

## 2021-10-17 LAB — BASIC METABOLIC PANEL
Anion gap: 9 (ref 5–15)
BUN: 21 mg/dL (ref 8–23)
CO2: 30 mmol/L (ref 22–32)
Calcium: 9.5 mg/dL (ref 8.9–10.3)
Chloride: 99 mmol/L (ref 98–111)
Creatinine, Ser: 1.65 mg/dL — ABNORMAL HIGH (ref 0.61–1.24)
GFR, Estimated: 39 mL/min — ABNORMAL LOW (ref >60)
Glucose, Bld: 150 mg/dL — ABNORMAL HIGH (ref 70–99)
Potassium: 3.7 mmol/L (ref 3.5–5.1)
Sodium: 138 mmol/L (ref 135–145)

## 2021-10-17 LAB — GLUCOSE, CAPILLARY
Glucose-Capillary: 105 mg/dL — ABNORMAL HIGH (ref 70–99)
Glucose-Capillary: 143 mg/dL — ABNORMAL HIGH (ref 70–99)
Glucose-Capillary: 116 mg/dL — ABNORMAL HIGH (ref 70–99)
Glucose-Capillary: 184 mg/dL — ABNORMAL HIGH (ref 70–99)

## 2021-10-17 LAB — MAGNESIUM: Magnesium: 1.9 mg/dL (ref 1.7–2.4)

## 2021-10-17 MED ORDER — FUROSEMIDE 40 MG PO TABS
40.0000 mg | ORAL_TABLET | Freq: Every day | ORAL | Status: DC
  Administered 2021-10-17 – 2021-10-18 (×2): 40 mg via ORAL
  Filled 2021-10-17 (×2): qty 1

## 2021-10-17 MED ORDER — ORAL CARE MOUTH RINSE: 15.0000 mL | OROMUCOSAL | Status: DC | PRN

## 2021-10-17 MED ORDER — LORATADINE 10 MG PO TABS
10.0000 mg | ORAL_TABLET | Freq: Every day | ORAL | Status: DC | PRN
  Administered 2021-10-17 – 2021-10-18 (×2): 10 mg via ORAL
  Filled 2021-10-17: qty 1

## 2021-10-17 NOTE — Progress Notes (Signed)
PROGRESS NOTE    Jordan Hoffman  YNW:[id number] DOB: [birth year] DOA: 10/13/2021 PCP: [facility] [place]   Brief Narrative:  Jordan Hoffman is a 88 y.o. male with medical history significant of AF on eliquis, COPD, T2DM, CKD stage 3a, ACD, OSA, HLD, HTN, recent covid infection 18 days ago, recent CVA in setting of complicated hospital stay in August for cholecystitis. Underwent lap cholecystectomy and hernia repair on 6/57/84 complicated by A-fib with RVR and NSTEMI. Code stroke on 8/14 with MRI brain showing acute left sided infarcts. Discharged to SNF. Admitted to [facility] on 9/10 for acute respiratory failure due to acute on chronic CHF exacerbation +covid 19.  He admitted in the hospital with shortness of breath since 3 days associated with lower extremity swelling and orthopnea.,  In the ED work-up noted BNP of 1356, chest x-ray with pulmonary edema and small pleural effusion, patchy consolidation at the base, from prior  Assessment & Plan:   Acute on chronic combined congestive heart failure: -Echo from 09/26/2021 showed ejection fraction of 30 to 35%, moderate decreased LVEF with global hypokinesis and diastolic parameters could not be determined. -BNP 1356.  Chest x-ray findings of diffuse pulmonary edema. -Diet recent with IV Lasix, he is 8.4 L negative, creatinine 1.6 -Change Lasix to 40 Mg daily -Ambulate, PT OT  ?dysphagia, recent pneumonia -Clinically no evidence of pneumonia at this time, chest x-ray with residual findings from prior -Check SLP eval, recent stroke noted  Paroxysmal A-fib: CHA2DS2-VASc score: 7 -Continue Eliquis and Lopressor  Elevated troponin: Likely demand ischemia in the setting of CHF exacerbation -No ACS  COPD:  -Stable continue home inhalers  Hypertension: Stable.  Continue Lopressor  AKI on CKD stage IIIa: -Creatinine now 1.6, baseline around 1.3,  Well-controlled type 2 diabetes mellitus:  -Last A1c 6.9 checked on 8/23.  Continue sliding scale  insulin  Hyperlipidemia: He is allergic to statins  OSA: Continue CPAP at bedtime  History of CVA: -Continue Eliquis  Normocytic anemia: Hemoglobin is at baseline.  Continue to monitor  DVT prophylaxis: Eliquis Code Status: Discussed CODE STATUS with patient and wife, he is agreeable to DNR Family Communication: Discussed with patient and wife at bedside  Disposition Plan: Likely home tomorrow     Subjective: -Feels okay overall, continues to have cough, some weakness  Objective: Vitals:   10/16/21 2319 10/17/21 0516 10/17/21 0734 10/17/21 0745  BP: 134/83 132/83    Pulse: 84 91 77   Resp: 18 20 19    Temp: 98.7 F (37.1 C) 98.9 F (37.2 C)  98.3 F (36.8 C)  TempSrc: Axillary Axillary  Oral  SpO2: 95% 92% 92%   Weight:  80.4 kg    Height:        Intake/Output Summary (Last 24 hours) at 10/17/2021 0932 Last data filed at 10/17/2021 0526 Gross per 24 hour  Intake 360 ml  Output 1500 ml  Net -1140 ml   Filed Weights   10/15/21 0655 10/16/21 0619 10/17/21 0516  Weight: 79.7 kg 79.8 kg 80.4 kg    Examination:  General exam: Elderly chronically ill male sitting up in bed, AAOx3, no distress HEENT: No JVD CVS: S1-S2, regular rhythm Lungs: Decreased breath sounds the bases Abdomen: Soft, nontender, bowel sounds present Extremities: No edema Psychiatry: Judgement and insight appear normal. Mood & affect appropriate.    Data Reviewed: I have personally reviewed following labs and imaging studies  CBC: Recent Labs  Lab 10/13/21 1823 10/14/21 0748 10/15/21 0048  WBC 6.7  5.1 5.8  NEUTROABS 4.7 3.6  --   HGB 12.2* 12.3* 12.4*  HCT 38.7* 37.9* 38.4*  MCV 88.6 86.3 88.5  PLT 205 196 627   Basic Metabolic Panel: Recent Labs  Lab 10/13/21 1823 10/14/21 0748 10/14/21 1627 10/15/21 0048 10/15/21 0111 10/16/21 0034 10/17/21 0010  NA 138 140  --  141  --  138 138  K 4.0 3.6  --  3.7  --  3.5 3.7  CL 105 105  --  100  --  99 99  CO2 25 28  --  31  --   31 30  GLUCOSE 117* 165*  --  128*  --  152* 150*  BUN 17 16  --  14  --  18 21  CREATININE 1.16 1.02  --  1.38*  --  1.64* 1.65*  CALCIUM 9.4 9.2  --  9.5  --  9.2 9.5  MG  --   --  1.7  --  1.6* 1.7 1.9   GFR: Estimated Creatinine Clearance: 33.2 mL/min (A) (by C-G formula based on SCr of 1.65 mg/dL (H)). Liver Function Tests: No results for input(s): "AST", "ALT", "ALKPHOS", "BILITOT", "PROT", "ALBUMIN" in the last 168 hours. No results for input(s): "LIPASE", "AMYLASE" in the last 168 hours. No results for input(s): "AMMONIA" in the last 168 hours. Coagulation Profile: No results for input(s): "INR", "PROTIME" in the last 168 hours. Cardiac Enzymes: No results for input(s): "CKTOTAL", "CKMB", "CKMBINDEX", "TROPONINI" in the last 168 hours. BNP (last 3 results) No results for input(s): "PROBNP" in the last 8760 hours. HbA1C: No results for input(s): "HGBA1C" in the last 72 hours. CBG: Recent Labs  Lab 10/16/21 0615 10/16/21 1119 10/16/21 1610 10/16/21 2125 10/17/21 0609  GLUCAP 112* 144* 130* 162* 105*   Lipid Profile: No results for input(s): "CHOL", "HDL", "LDLCALC", "TRIG", "CHOLHDL", "LDLDIRECT" in the last 72 hours. Thyroid Function Tests: No results for input(s): "TSH", "T4TOTAL", "FREET4", "T3FREE", "THYROIDAB" in the last 72 hours. Anemia Panel: No results for input(s): "VITAMINB12", "FOLATE", "FERRITIN", "TIBC", "IRON", "RETICCTPCT" in the last 72 hours. Sepsis Labs: Recent Labs  Lab 10/14/21 2041  PROCALCITON <0.10    Recent Results (from the past 240 hour(s))  MRSA Next Gen by PCR, Nasal     Status: None   Collection Time: 10/14/21  9:20 PM   Specimen: Nasal Mucosa; Nasal Swab  Result Value Ref Range Status   MRSA by PCR Next Gen NOT DETECTED NOT DETECTED Final    Comment: (NOTE) The GeneXpert MRSA Assay (FDA approved for NASAL specimens only), is one component of a comprehensive MRSA colonization surveillance program. It is not intended to diagnose  MRSA infection nor to guide or monitor treatment for MRSA infections. Test performance is not FDA approved in patients less than 33 years old. Performed at [facility], [place] [address], [place], [place] [address]       Radiology Studies: DG CHEST PORT 1 VIEW  Result Date: 10/17/2021 CLINICAL DATA:  Dyspnea. EXAM: PORTABLE CHEST 1 VIEW COMPARISON:  10/14/2021 FINDINGS: 0613 hours. The cardio pericardial silhouette is enlarged. Interstitial markings are diffusely coarsened with chronic features. Interval improvement in aeration at the left base. No substantial pleural effusion. The visualized bony structures of the thorax are unremarkable. Telemetry leads overlie the chest. IMPRESSION: Chronic interstitial coarsening. Interval improvement in basilar aeration with some minimal residual atelectasis at the left base. Electronically Signed   By: Misty Stanley M.D.   On: 10/17/2021 06:42  Scheduled Meds:  apixaban  2.5 mg Oral BID   arformoterol  15 mcg Nebulization BID   And   umeclidinium bromide  1 puff Inhalation Daily   budesonide  0.5 mg Nebulization BID   furosemide  40 mg Oral Daily   gabapentin  300 mg Oral TID   insulin aspart  0-9 Units Subcutaneous TID WC   latanoprost  1 drop Both Eyes QHS   loratadine  10 mg Oral Daily   metoprolol tartrate  37.5 mg Oral BID   pantoprazole  40 mg Oral Daily   sodium chloride  2 spray Each Nare BID   sodium chloride flush  3 mL Intravenous Q12H   Continuous Infusions:  sodium chloride       LOS: 2 days   Time spent: 35 minutes   Domenic Polite, MD [facility]   10/17/2021, 9:32 AM

## 2021-10-17 NOTE — Progress Notes (Signed)
Patient placed on CPAP at this time.  

## 2021-10-17 NOTE — TOC Progression Note (Signed)
Transition of Care [facility]) - Progression Note    Patient Details  Name: Jordan Hoffman MRN: [id number] Date of Birth: [birth year]  Transition of Care [facility]) CM/SW [place], RN Phone Number:[phone]  10/17/2021, 9:08 AM  Clinical Narrative:    Patient is active with [facility] for home health services.        Expected Discharge Plan and Services                                                 Social Determinants of Health (SDOH) Interventions    Readmission Risk Interventions     No data to display

## 2021-10-17 NOTE — Plan of Care (Signed)
  Problem: Education: Goal: Knowledge of risk factors and measures for prevention of condition will improve Outcome: Progressing   Problem: Coping: Goal: Psychosocial and spiritual needs will be supported Outcome: Progressing   Problem: Respiratory: Goal: Will maintain a patent airway Outcome: Progressing   Problem: Education: Goal: Knowledge of General Education information will improve Description: Including pain rating scale, medication(s)/side effects and non-pharmacologic comfort measures Outcome: Progressing   Problem: Clinical Measurements: Goal: Ability to maintain clinical measurements within normal limits will improve Outcome: Progressing Goal: Will remain free from infection Outcome: Progressing   

## 2021-10-17 NOTE — Progress Notes (Signed)
Pt is requesting allergy medication but has Allergy in his chart to Loratadine. Pt denies allergy to Loratadine. MD notified.

## 2021-10-17 NOTE — Evaluation (Signed)
Physical Therapy Evaluation Patient Details Name: Jordan Hoffman MRN: [id number] DOB: 06/12/1928 Today's Date: 10/17/2021  History of Present Illness  Pt is a 88 y/o M presenting to [facility] on 10/13/21 with pt/famiy statements of SOB, DOE, increase LE swelling and orthopnea. Admitted for acute on chronic HFrEF. Chest x-ray impressions indicate cardiomegaly with small pleural effusions, diffuse pulmonary edema, and suspcious for pneumonia. Of note, recent admission 08/27/21 and 09/25/21 with lap chole, hernia repair, afib, NSTEMI, acute L side stroke, COVID+. Other significant PMH to include AFib, COPD, T2DM, CKD stage 3a, HLD, HTN, back and knee pain.   Clinical Impression  Pt presents with an overall decrease in functional mobility secondary to above. Prior to most recent admission, pt mod independent ambulating with RW and general supervision from spouse during ADLs. Educ on importance of mobility to maintain gains from recent SNF stay. Today, pt able to ambulate 180 ft with RW min guard, mod independent with bed mobility. Pt required verbal cues for hallways negotiation of RW. During mobility, HR in low 100s and consistent Afib with no significant changes. Pt would benefit from continued acute PT services to maximize functional mobility and independence prior to d/c to home with home health services.        Recommendations for follow up therapy are one component of a multi-disciplinary discharge planning process, led by the attending physician.  Recommendations may be updated based on patient status, additional functional criteria and insurance authorization.  Follow Up Recommendations Home health PT Can patient physically be transported by private vehicle: Yes    Assistance Recommended at Discharge Intermittent Supervision/Assistance  Patient can return home with the following  A little help with walking and/or transfers;A little help with bathing/dressing/bathroom;Help with stairs or ramp for  entrance;Assist for transportation    Equipment Recommendations None recommended by PT  Recommendations for Other Services       Functional Status Assessment Patient has had a recent decline in their functional status and demonstrates the ability to make significant improvements in function in a reasonable and predictable amount of time.     Precautions / Restrictions Precautions Precautions: Fall Restrictions Weight Bearing Restrictions: No      Mobility  Bed Mobility Overal bed mobility: Needs Assistance Bed Mobility: Supine to Sit     Supine to sit: Modified independent (Device/Increase time)     General bed mobility comments: Long sit with inc time, demonstrates good trunk control    Transfers Overall transfer level: Needs assistance   Transfers: Sit to/from Stand Sit to Stand: Min guard           General transfer comment: Pt demonstrates proper sequencing, recommend elevating bed due to pt height    Ambulation/Gait Ambulation/Gait assistance: Min guard Gait Distance (Feet): 180 Feet Assistive device: Rolling walker (2 wheels) Gait Pattern/deviations: Step-through pattern, Decreased stride length Gait velocity: decreased     General Gait Details: Inconsistent narrow base of support during ambulation, completed gait with no standing or seated rest breaks, verbal cues for RW negotiation during turning  Stairs            Wheelchair Mobility    Modified Rankin (Stroke Patients Only)       Balance Overall balance assessment: Needs assistance Sitting-balance support: No upper extremity supported Sitting balance-Leahy Scale: Good Sitting balance - Comments: prolonged sitting EOB no postural deviations noted   Standing balance support: Bilateral upper extremity supported, Reliant on assistive device for balance Standing balance-Leahy Scale: Fair Standing balance comment: utilizes  RW for support, pt able to static stand for 15 sec supervision                              Pertinent Vitals/Pain Pain Assessment Pain Assessment: Faces Faces Pain Scale: Hurts little more Pain Location: Low back Pain Descriptors / Indicators: Nagging Pain Intervention(s): Monitored during session    Home Living Family/patient expects to be discharged to:: Private residence Living Arrangements: Spouse/significant other Available Help at Discharge: Family;Available 24 hours/day Type of Home: House Home Access: Level entry       Home Layout: One level;Other (Comment) (2 steps down into living space with 1 handrail and nearby stable furniture support) Home Equipment: [place] - single point;Rolling Walker (2 wheels);Rollator (4 wheels);Grab bars - tub/shower;Shower seat      Prior Function Prior Level of Function : Needs assist       Physical Assist : ADLs (physical)   ADLs (physical): IADLs Mobility Comments: Pt at SNF post CVA and +COVID complications, was home two days before current admission ADLs Comments: Was independent with ADLs and light IADLs prior to recent series of hosptial admissions     Hand Dominance        Extremity/Trunk Assessment   Upper Extremity Assessment Upper Extremity Assessment: Overall WFL for tasks assessed (able to utilize RW for functional use of UEs)    Lower Extremity Assessment Lower Extremity Assessment: Overall WFL for tasks assessed (no formal strength deficits indicated during functional mobility)    Cervical / Trunk Assessment Cervical / Trunk Assessment: Normal  Communication   Communication: No difficulties  Cognition Arousal/Alertness: Awake/alert Behavior During Therapy: WFL for tasks assessed/performed   Area of Impairment: Following commands, Awareness                       Following Commands: Follows multi-step commands inconsistently   Awareness: Emergent   General Comments: Pt inconsitent with competing obstacle negotiation with RW appropriately, unsure if  cognitive or if task unclear        General Comments General comments (skin integrity, edema, etc.): Initial vitals HR 78 bpm, SpO2 100%, HR 100-112 during ambulation, sustained Afib during mobility. Family present and demonstrate a positive support system.    Exercises     Assessment/Plan    PT Assessment Patient needs continued PT services  PT Problem List Decreased strength;Decreased mobility;Decreased coordination;Decreased activity tolerance;Cardiopulmonary status limiting activity;Decreased cognition;Decreased balance;Decreased knowledge of use of DME       PT Treatment Interventions DME instruction;Therapeutic exercise;Gait training;Balance training;Stair training;Functional mobility training;Therapeutic activities;Patient/family education    PT Goals (Current goals can be found in the Care Plan section)  Acute Rehab PT Goals Patient Stated Goal: to not use the walker PT Goal Formulation: With patient/family Time For Goal Achievement: 10/31/21 Potential to Achieve Goals: Fair    Frequency Min 3X/week     Co-evaluation               AM-PAC PT "6 Clicks" Mobility  Outcome Measure Help needed turning from your back to your side while in a flat bed without using bedrails?: None Help needed moving from lying on your back to sitting on the side of a flat bed without using bedrails?: None Help needed moving to and from a bed to a chair (including a wheelchair)?: None Help needed standing up from a chair using your arms (e.g., wheelchair or bedside chair)?: A Little Help needed to walk  in hospital room?: A Little Help needed climbing 3-5 steps with a railing? : A Lot 6 Click Score: 20    End of Session Equipment Utilized During Treatment: Gait belt Activity Tolerance: Patient tolerated treatment well Patient left: in chair;with call bell/phone within reach;with family/visitor present;with chair alarm set Nurse Communication: Mobility status PT Visit Diagnosis:  Other abnormalities of gait and mobility (R26.89);Muscle weakness (generalized) (M62.81)    Time: 9437-0052 PT Time Calculation (min) (ACUTE ONLY): 27 min   Charges:   PT Evaluation $PT Eval Moderate Complexity: 1 Mod         [address] [place], SPT   [place] Duel Conrad 10/17/2021, 12:31 PM

## 2021-10-17 NOTE — Evaluation (Signed)
Clinical/Bedside Swallow Evaluation Patient Details  Name: Jordan Hoffman MRN: [id number] Date of Birth: Jan 07, 1929  Today's Date: 10/17/2021 Time: SLP Start Time (ACUTE ONLY): 25 SLP Stop Time (ACUTE ONLY): 1216 SLP Time Calculation (min) (ACUTE ONLY): 11 min  Past Medical History:  Past Medical History:  Diagnosis Date   Atrial fibrillation (HCC)    Back pain    COPD (chronic obstructive pulmonary disease) (HCC)    Diabetes mellitus without complication (HCC)    High cholesterol    Hypertension    Knee pain    Past Surgical History:  Past Surgical History:  Procedure Laterality Date   BACK SURGERY     HIP SURGERY     JOINT REPLACEMENT Left    left hip replacement   MOUTH SURGERY     REPLACEMENT TOTAL KNEE Right    TRANSURETHRAL RESECTION OF PROSTATE     HPI:  Pt is a 88 y/o M presenting to [facility] on 10/13/21 with pt/famiy statements of SOB, DOE, increase LE swelling and orthopnea. Admitted for acute on chronic HFrEF. Recent CVA in setting of complicated hospital stay for cholecystitis. Code stroke on 8/14 with MRI brain showing acute left sided infarcts. Previous [place] asmission on 9/10 acute respiratory failure due to acute on chronic CHF exacerbation +covid 19. Chest x-ray impressions indicate cardiomegale with small pleural effusions, diffuse pulmonary edema, and suspcious for pneumonia. Significant PMH includes AFib, COPD, T2DM, CKD stage 3a, HLD, HTN, back and knee pain.    Assessment / Plan / Recommendation  Clinical Impression   Pt encountered upright in chair, with meal tray. Pt alert throughout session and adequately followed all simple commands. He reports no perceived recent changes in swallow function or history of swallow dysfunction. Oral motor assessment appeared grossly WFL, and natural dentition appeared adequate for mastication. Pt observed with trials of thin liquids, puree and regular consistency. No overt s/sx of aspiration or dyphagia noted throughout all trials. Due  to absence of s/sx aspiration/dysphagia, no ST f/u recommended at this time.  SLP Visit Diagnosis: Dysphagia, unspecified (R13.10)    Aspiration Risk  No limitations    Diet Recommendation Regular;Thin liquid   Liquid Administration via: Straw;Cup Medication Administration: Whole meds with liquid Supervision: Patient able to self feed Compensations: Small sips/bites;Slow rate Postural Changes: Seated upright at 90 degrees    Other  Recommendations Oral Care Recommendations: Oral care BID    Recommendations for follow up therapy are one component of a multi-disciplinary discharge planning process, led by the attending physician.  Recommendations may be updated based on patient status, additional functional criteria and insurance authorization.  Follow up Recommendations No SLP follow up      Assistance Recommended at Discharge PRN  Functional Status Assessment Patient has not had a recent decline in their functional status  Frequency and Duration            Prognosis        Swallow Study   General Date of Onset: 10/13/21 HPI: Pt is a 88 y/o M presenting to [facility] on 10/13/21 with pt/famiy statements of SOB, DOE, increase LE swelling and orthopnea. Admitted for acute on chronic HFrEF. Recent CVA in setting of complicated hospital stay for cholecystitis. Code stroke on 8/14 with MRI brain showing acute left sided infarcts. Previous [place] asmission on 9/10 acute respiratory failure due to acute on chronic CHF exacerbation +covid 19. Chest x-ray impressions indicate cardiomegale with small pleural effusions, diffuse pulmonary edema, and suspcious for pneumonia. Significant PMH includes AFib,  COPD, T2DM, CKD stage 3a, HLD, HTN, back and knee pain. Type of Study: Bedside Swallow Evaluation Diet Prior to this Study: Thin liquids;Regular Temperature Spikes Noted: No Respiratory Status: Room air History of Recent Intubation: No Behavior/Cognition: Alert;Cooperative;Pleasant mood Oral  Cavity Assessment: Within Functional Limits Oral Care Completed by SLP: No Oral Cavity - Dentition: Adequate natural dentition Vision: Functional for self-feeding Self-Feeding Abilities: Able to feed self Patient Positioning: Upright in chair Baseline Vocal Quality: Normal Volitional Cough: Strong;Congested    Oral/Motor/Sensory Function Overall Oral Motor/Sensory Function: Within functional limits   Ice Chips Ice chips: Not tested   Thin Liquid Thin Liquid: Within functional limits Presentation: Straw;Self Fed    Nectar Thick Nectar Thick Liquid: Not tested   Honey Thick Honey Thick Liquid: Not tested   Puree Puree: Within functional limits Presentation: Self Fed;Spoon   Solid     Solid: Within functional limits Presentation: [place] Student SLP  10/17/2021,1:24 PM

## 2021-10-17 NOTE — Progress Notes (Signed)
Mobility Specialist Progress Note    10/17/21 1641  Mobility  Activity Ambulated with assistance in room  Activity Response Tolerated well  Distance Ambulated (ft) 5 ft  $Mobility charge 1 Mobility  Level of Assistance Contact guard assist, steadying assist  Assistive Device Front wheel walker  Mobility Referral Yes   Pt received in bed and agreeable to get to chair for dinner. No complaints. Left with call bell in reach and NT present.   Hildred Alamin Mobility Specialist

## 2021-10-18 ENCOUNTER — Other Ambulatory Visit (HOSPITAL_COMMUNITY): Payer: Self-pay

## 2021-10-18 DIAGNOSIS — I5023 Acute on chronic systolic (congestive) heart failure: Secondary | ICD-10-CM | POA: Diagnosis not present

## 2021-10-18 LAB — BASIC METABOLIC PANEL
Anion gap: 9 (ref 5–15)
BUN: 22 mg/dL (ref 8–23)
CO2: 30 mmol/L (ref 22–32)
Calcium: 9.5 mg/dL (ref 8.9–10.3)
Chloride: 98 mmol/L (ref 98–111)
Creatinine, Ser: 1.57 mg/dL — ABNORMAL HIGH (ref 0.61–1.24)
GFR, Estimated: 41 mL/min — ABNORMAL LOW (ref >60)
Glucose, Bld: 136 mg/dL — ABNORMAL HIGH (ref 70–99)
Potassium: 3.7 mmol/L (ref 3.5–5.1)
Sodium: 137 mmol/L (ref 135–145)

## 2021-10-18 LAB — CBC
HCT: 37.7 % — ABNORMAL LOW (ref 39.0–52.0)
Hemoglobin: 12.2 g/dL — ABNORMAL LOW (ref 13.0–17.0)
MCH: 28.2 pg (ref 26.0–34.0)
MCHC: 32.4 g/dL (ref 30.0–36.0)
MCV: 87.3 fL (ref 80.0–100.0)
Platelets: 246 10*3/uL (ref 150–400)
RBC: 4.32 MIL/uL (ref 4.22–5.81)
RDW: 15.4 % (ref 11.5–15.5)
WBC: 4.2 10*3/uL (ref 4.0–10.5)
nRBC: 0 % (ref 0.0–0.2)

## 2021-10-18 LAB — GLUCOSE, CAPILLARY: Glucose-Capillary: 124 mg/dL — ABNORMAL HIGH (ref 70–99)

## 2021-10-18 MED ORDER — FUROSEMIDE 40 MG PO TABS: 40.0000 mg | ORAL_TABLET | Freq: Every day | ORAL | 0 refills | Status: DC

## 2021-10-18 MED ORDER — SPIRONOLACTONE 25 MG PO TABS
12.5000 mg | ORAL_TABLET | Freq: Every day | ORAL | 0 refills | Status: DC
  Filled 2021-10-18: qty 30, 60d supply, fill #0

## 2021-10-18 NOTE — Progress Notes (Signed)
All set for discharge awaiting med from [place].

## 2021-10-18 NOTE — Progress Notes (Signed)
Heart Failure Nurse Navigator Progress Note   Patient was made a [facility] Cardiology appointment for 10/31/2021. HF Navigator will sign off at this time.   Earnestine Leys, BSN, Clinical cytogeneticist Only

## 2021-10-18 NOTE — TOC Transition Note (Signed)
Transition of Care [facility]) - CM/SW Discharge Note   Patient Details  Name: Jordan Hoffman MRN: [id number] Date of Birth: 1928/12/26  Transition of Care [facility] CM/SW Contact:  Angelita Ingles, RN Phone Number:[phone]  10/18/2021, 8:55 AM   Clinical Narrative:    Patient with discharge orders. Patient previously active with home health. Orders to resume and avs updated   Final next level of care: Home w Home Health Services Barriers to Discharge: No Barriers Identified   Patient Goals and CMS Choice        Discharge Placement                       Discharge Plan and Services                DME Arranged: N/A DME Agency: NA       HH Arranged: RN, PT, OT [place] Agency: Other - See comment ([facility]) Date HH Agency Contacted: 10/18/21 Time [place]: [phone] Representative spoke with at [place]: [place] Determinants of Health ([place]) Interventions     Readmission Risk Interventions     No data to display

## 2021-10-18 NOTE — Discharge Summary (Signed)
Physician Discharge Summary  ORVIL FARAONE UQJ:[id number] DOB: Oct 05, 1928 DOA: 10/13/2021  PCP: [facility]  Admit date: 10/13/2021 Discharge date: 10/18/2021  Time spent:  35 minutes  Recommendations for Outpatient Follow-up:  PCP in 1 week Cardiology at the [facility] in 1 month CHF TOC clinic in 2 weeks   Discharge Diagnoses:  Principal Problem:   Acute on chronic HFrEF (heart failure with reduced ejection fraction) ([place]) Active Problems:   Atrial fibrillation (HCC)   Elevated troponin   COPD (chronic obstructive pulmonary disease) (HCC)   Hypertension   Stage 3a chronic kidney disease (CKD) ([place])   Type 2 diabetes mellitus ([place])   Hyperlipidemia   OSA (obstructive sleep apnea)   Discharge Condition: Stable  Diet recommendation: Low-sodium, heart healthy  Filed Weights   10/16/21 0619 10/17/21 0516 10/18/21 0421  Weight: 79.8 kg 80.4 kg 80.8 kg    History of present illness:   Jordan Hoffman is a 88 y.o. male with medical history significant of AF on eliquis, COPD, T2DM, CKD stage 3a, ACD, OSA, HLD, HTN, recent covid infection 18 days ago, recent CVA in setting of complicated hospital stay in August for cholecystitis. Underwent lap cholecystectomy and hernia repair on 3/89/37 complicated by A-fib with RVR and NSTEMI. Code stroke on 8/14 with MRI brain showing acute left sided infarcts. Discharged to SNF. Admitted to [facility] on 9/10 for acute respiratory failure due to acute on chronic CHF exacerbation +covid 19.  He admitted in the hospital with shortness of breath since 3 days associated with lower extremity swelling and orthopnea.,  In the ED work-up noted BNP of 1356, chest x-ray with pulmonary edema and small pleural effusion, patchy consolidation at the base, from prior  Hospital Course:   Acute on chronic combined congestive heart failure: -Echo from 09/26/2021 showed ejection fraction of 30 to 35%, moderate decreased LVEF with global hypokinesis and diastolic parameters  could not be determined. -BNP 1356.  Chest x-ray findings of diffuse pulmonary edema. -Diet recent with IV Lasix, he is 9.2 L negative, creatinine 1.6 -Volume status improved, weaned off O2 -Changed to Lasix 40 Mg daily and added low-dose Aldactone at discharge -Followed by cardiology at the [facility], recommended follow-up in 1 month, CHF TOC clinic follow-up arranged as well   ?dysphagia, recent pneumonia -Clinically no evidence of pneumonia at this time, chest x-ray with residual findings from prior -SLP eval completed, no concern for ongoing aspiration   Paroxysmal A-fib: CHA2DS2-VASc score: 7 -Continue Eliquis and Lopressor   Elevated troponin: Likely demand ischemia in the setting of CHF exacerbation -No ACS   COPD:  -Stable continue home inhalers   Hypertension: Stable.  Continue Lopressor   AKI on CKD stage IIIa: -Creatinine now 1.6, baseline around 1.3,   Well-controlled type 2 diabetes mellitus:  -Last A1c 6.9 checked on 8/23.  Continue sliding scale insulin   Hyperlipidemia: He is allergic to statins   OSA: Continue CPAP at bedtime   History of CVA: -Continue Eliquis   Normocytic anemia: Hemoglobin is at baseline.     Code Status: Discussed CODE STATUS with patient and wife, he is agreeable to DNR   Discharge Exam: Vitals:   10/18/21 0421 10/18/21 0833  BP: 137/83 124/77  Pulse: 82 87  Resp: 20 18  Temp: 98 F (36.7 C) 98.3 F (36.8 C)  SpO2: 95% 95%   General exam: Elderly chronically ill male sitting up in bed, AAOx3, no distress HEENT: No JVD CVS: S1-S2, regular rhythm Lungs: Decreased  breath sounds the bases Abdomen: Soft, nontender, bowel sounds present Extremities: No edema Psychiatry: Judgement and insight appear normal. Mood & affect appropriate.     Discharge Instructions   Discharge Instructions     Diet - low sodium heart healthy   Complete by: As directed    Increase activity slowly   Complete by: As directed    No wound care    Complete by: As directed       Allergies as of 10/18/2021       Reactions   Lisinopril Cough      Penicillins Other (See Comments)   Did it involve swelling of the face/tongue/throat, SOB, or low BP? Yes Did it involve sudden or severe rash/hives, skin peeling, or any reaction on the inside of your mouth or nose? No Did you need to seek medical attention at a hospital or doctor's office? No When did it last happen? Unk  If all above answers are "NO", may proceed with cephalosporin use.   *pt tolerated ancef on 08/08/16   Pravastatin Hives      Sulfa Antibiotics Itching, Other (See Comments)   Itching, watery eyes   Atorvastatin Other (See Comments)   Muscle pain   Codeine Hives      Flunisolide Hives   epistaxis Other reaction(s): Bleeding from nose, Other (See Comments) Hives  Hives    Niacin And Related    Hives    Niaspan [niacin] Hives, Other (See Comments)   Flushing, also   Simvastatin Hives   weakness Other reaction(s): Weakness present   Statins Hives   weakness Other reaction(s): Other (See Comments), Unknown Hives  weakness Hives  Hives  Hives    Ciprofloxacin Rash   Hives  Other reaction(s): Other (See Comments), Other (See Comments) Hives  Hives  Hives    Erythromycin Rash   Hives  Other reaction(s): Other (See Comments), Other (See Comments) Hives  Hives  Hives    Ranitidine Rash   Hives  Other reaction(s): Other (See Comments), Other (See Comments) Hives  Hives  Hives         Medication List     STOP taking these medications    OXYGEN       TAKE these medications    acetaminophen 325 MG tablet Commonly known as: TYLENOL Take 650 mg by mouth every 6 (six) hours as needed for mild pain or moderate pain.   albuterol (2.5 MG/3ML) 0.083% nebulizer solution Commonly known as: PROVENTIL Take 2.5 mg by nebulization every 4 (four) hours as needed for wheezing or shortness of breath.   albuterol 108 (90 Base) MCG/ACT  inhaler Commonly known as: VENTOLIN HFA Inhale 2 puffs into the lungs every 4 (four) hours as needed for wheezing or shortness of breath.   Alvesco 160 MCG/ACT inhaler Generic drug: ciclesonide Inhale 2 puffs into the lungs 2 (two) times daily.   apixaban 2.5 MG Tabs tablet Commonly known as: ELIQUIS Take 1 tablet (2.5 mg total) by mouth 2 (two) times daily.   carboxymethylcellulose 0.5 % Soln Commonly known as: REFRESH PLUS Place 1 drop into both eyes as needed (for dry eyes).   cholecalciferol 25 MCG (1000 UNIT) tablet Commonly known as: VITAMIN D3 Take 1,000 Units by mouth See admin instructions. 1 by mouth two times a week on Saturday and Sunday for supplement.   diclofenac Sodium 1 % Gel Commonly known as: VOLTAREN Apply 2 g topically 4 (four) times daily as needed (shoulder pain).   docusate sodium 100  MG capsule Commonly known as: COLACE Take 1 capsule (100 mg total) by mouth 2 (two) times daily as needed for mild constipation.   fluticasone 50 MCG/ACT nasal spray Commonly known as: FLONASE Place 2 sprays into both nostrils daily.   furosemide 40 MG tablet Commonly known as: LASIX Take 1 tablet (40 mg total) by mouth daily.   gabapentin 300 MG capsule Commonly known as: NEURONTIN Take 300 mg by mouth 3 (three) times daily.   guaiFENesin 600 MG 12 hr tablet Commonly known as: MUCINEX Take 600 mg by mouth 2 (two) times daily.   latanoprost 0.005 % ophthalmic solution Commonly known as: XALATAN Place 1 drop into both eyes at bedtime.   loratadine 10 MG tablet Commonly known as: CLARITIN Take 10 mg by mouth daily.   Metoprolol Tartrate 37.5 MG Tabs Take 1 tablet by mouth 2 (two) times daily.   omeprazole 20 MG capsule Commonly known as: PRILOSEC Take 20 mg by mouth daily.   Oyster Shell 500 MG Tabs Take 500 mg by mouth 2 (two) times daily.   polyethylene glycol 17 g packet Commonly known as: MIRALAX / GLYCOLAX Take 17 g by mouth daily as needed for  moderate constipation.   PSYLLIUM HUSK PO Take 1 capsule by mouth as needed (for constipation).   sodium chloride 0.65 % Soln nasal spray Commonly known as: OCEAN Place 2 sprays into both nostrils 2 (two) times daily.   spironolactone 25 MG tablet Commonly known as: Aldactone Take 0.5 tablets (12.5 mg total) by mouth daily.   Stiolto Respimat 2.5-2.5 MCG/ACT Aers Generic drug: Tiotropium Bromide-Olodaterol Inhale 2 puffs into the lungs daily.       Allergies  Allergen Reactions   Lisinopril Cough        Penicillins Other (See Comments)    Did it involve swelling of the face/tongue/throat, SOB, or low BP? Yes Did it involve sudden or severe rash/hives, skin peeling, or any reaction on the inside of your mouth or nose? No Did you need to seek medical attention at a hospital or doctor's office? No When did it last happen? Unk  If all above answers are "NO", may proceed with cephalosporin use.   *pt tolerated ancef on 08/08/16    Pravastatin Hives        Sulfa Antibiotics Itching and Other (See Comments)    Itching, watery eyes   Atorvastatin Other (See Comments)    Muscle pain    Codeine Hives        Flunisolide Hives    epistaxis Other reaction(s): Bleeding from nose, Other (See Comments) Hives  Hives     Niacin And Related     Hives    Niaspan [Niacin] Hives and Other (See Comments)    Flushing, also   Simvastatin Hives    weakness Other reaction(s): Weakness present   Statins Hives    weakness Other reaction(s): Other (See Comments), Unknown Hives  weakness Hives  Hives  Hives     Ciprofloxacin Rash    Hives  Other reaction(s): Other (See Comments), Other (See Comments) Hives  Hives  Hives     Erythromycin Rash    Hives  Other reaction(s): Other (See Comments), Other (See Comments) Hives  Hives  Hives     Ranitidine Rash    Hives  Other reaction(s): Other (See Comments), Other (See Comments) [facility] Follow up.  Why: You will resume home health services with [facility] The office will call you about resumption of services . If you have any questions or conserns please call the number listed above. Contact information: [address] [address] [phone]                  The results of significant diagnostics from this hospitalization (including imaging, microbiology, ancillary and laboratory) are listed below for reference.    Significant Diagnostic Studies: DG CHEST PORT 1 VIEW  Result Date: 10/17/2021 CLINICAL DATA:  Dyspnea. EXAM: PORTABLE CHEST 1 VIEW COMPARISON:  10/14/2021 FINDINGS: 0613 hours. The cardio pericardial silhouette is enlarged. Interstitial markings are diffusely coarsened with chronic features. Interval improvement in aeration at the left base. No substantial pleural effusion. The visualized bony structures of the thorax are unremarkable. Telemetry leads overlie the chest. IMPRESSION: Chronic interstitial coarsening. Interval improvement in basilar aeration with some minimal residual atelectasis at the left base. Electronically Signed   By: Misty Stanley M.D.   On: 10/17/2021 06:42   DG Chest 2 View  Result Date: 10/14/2021 CLINICAL DATA:  Pneumonia EXAM: CHEST - 2 VIEW COMPARISON:  10/13/2021 FINDINGS: Enlargement of cardiac silhouette. Mediastinal contours and pulmonary vascularity normal. Atherosclerotic calcification aorta. Persistent hazy infiltrate LEFT lung greatest at base. Improved RIGHT basilar aeration. Persistent bibasilar effusions and atelectasis. No pneumothorax. LEFT glenohumeral degenerative changes. IMPRESSION: Persistent LEFT basilar infiltrate, small bibasilar effusions and basilar atelectasis. Improved aeration at RIGHT base since previous study. Enlargement of cardiac silhouette. Aortic Atherosclerosis (ICD10-I70.0). Electronically Signed   By: Lavonia Dana M.D.   On:  10/14/2021 17:45   DG Chest Port 1 View  Result Date: 10/13/2021 CLINICAL DATA:  Shortness of breath EXAM: PORTABLE CHEST 1 VIEW COMPARISON:  09/28/2021 FINDINGS: Cardiomegaly with small pleural effusions. Diffuse increased interstitial and ground-glass opacities suspicious for pulmonary edema. More confluent airspace disease at the left greater than right lung base. No pneumothorax IMPRESSION: 1. Cardiomegaly with small pleural effusions and diffuse pulmonary edema 2. Patchy consolidations at the left greater than right lung base suspicious for pneumonia. Electronically Signed   By: Donavan Foil M.D.   On: 10/13/2021 18:47   DG CHEST PORT 1 VIEW  Result Date: 09/28/2021 CLINICAL DATA:  Shortness of breath, cough, COVID. EXAM: PORTABLE CHEST 1 VIEW COMPARISON:  Chest x-ray 09/27/2021. FINDINGS: Similar small left pleural effusion with overlying left basilar opacities. Right-sided skin fold. No visible pneumothorax. Enlarged cardiac silhouette. Severe bilateral shoulder degenerative change, partially imaged. IMPRESSION: 1. Similar small left pleural effusion with overlying left basilar atelectasis and/or consolidation. 2. Cardiomegaly. Electronically Signed   By: Margaretha Sheffield M.D.   On: 09/28/2021 08:20   DG CHEST PORT 1 VIEW  Result Date: 09/27/2021 CLINICAL DATA:  Shortness of breath EXAM: PORTABLE CHEST 1 VIEW COMPARISON:  Portable exam 0528 hours compared to 09/26/2021 FINDINGS: Enlargement of cardiac silhouette with pulmonary vascular congestion. Persistent atelectasis versus consolidation LEFT lower lobe. Subsegmental atelectasis RIGHT base. Minimal persistent interstitial prominence likely reflecting mild residual pulmonary edema. Minimal LEFT pleural effusion without pneumothorax. Advanced RIGHT glenohumeral degenerative changes. IMPRESSION: Enlargement of cardiac silhouette with pulmonary vascular congestion and minimal residual pulmonary edema. RIGHT basilar atelectasis with persistent  atelectasis versus consolidation and question small pleural effusion at LEFT base. Electronically Signed   By: Lavonia Dana M.D.   On: 09/27/2021 08:24   ECHOCARDIOGRAM COMPLETE  Result Date: 09/26/2021    ECHOCARDIOGRAM REPORT   Patient Name:   Temitope E  Mellor Date of Exam: 09/26/2021 Medical Rec #:  [id number]   Height:       76.0 in Accession #:    [id number]  Weight:       190.0 lb Date of Birth:  11/13/1928    BSA:          2.168 m Patient Age:    62 years    BP:           125/106 mmHg Patient Gender: M           HR:           98 bpm. Exam Location:  Inpatient Procedure: 2D Echo, Cardiac Doppler and Color Doppler Indications:     Acute Respiratory Distress; I48.1 Persistent atrial                  fibrillation; I50.41 Acute combined systolic (congestive) and                  diastolic (congestive) heart failure; R06.02 SOB  History:         Patient has no prior history of Echocardiogram examinations.                  CHF and Cardiomyopathy, Stroke, Arrythmias:Atrial Fibrillation,                  Signs/Symptoms:Dyspnea, Risk Factors:Hypertension, Diabetes and                  Dyslipidemia.; Medications:Beta Blockers.  Sonographer:     Harvie Junior Referring Phys:  [id number] Cleaster Corin OLLIS Diagnosing Phys: Rex Kras DO IMPRESSIONS  1. Left ventricular ejection fraction, by estimation, is 30 to 35%. The left ventricle has moderately decreased function. The left ventricle demonstrates global hypokinesis. There is moderate left ventricular hypertrophy. Left ventricular diastolic function could not be evaluated due to Afib.  2. Right ventricular systolic function is normal. The right ventricular size is mildly enlarged. Mildly increased right ventricular wall thickness. There is moderately elevated pulmonary artery systolic pressure. The estimated right ventricular systolic  pressure is 92.4 mmHg.  3. Left atrial size was mildly dilated.  4. Right atrial size was dilated.  5. The mitral valve is normal in structure. Mild  mitral valve regurgitation. No evidence of mitral stenosis.  6. Tricuspid valve regurgitation is moderate.  7. The aortic valve is tricuspid. Aortic valve regurgitation is not visualized. Aortic valve sclerosis is present, with no evidence of aortic valve stenosis.  8. The inferior vena cava is dilated in size with <50% respiratory variability, suggesting right atrial pressure of 15 mmHg.  9. Rhythm strip during this exam demonstrates atrial fibrillation. Comparison(s): Prior study outside hospital 08/28/2021: LVEF 30-35%, severe global hypokinesis, mild LAE, moderate RAE, moderate MR, mild to moderate TR, IVC dilated, no pericardial effusion. FINDINGS  Left Ventricle: Left ventricular ejection fraction, by estimation, is 30 to 35%. The left ventricle has moderately decreased function. The left ventricle demonstrates global hypokinesis. The left ventricular internal cavity size was small. There is moderate left ventricular hypertrophy. Left ventricular diastolic function could not be evaluated due to Afib. Right Ventricle: The right ventricular size is mildly enlarged. Mildly increased right ventricular wall thickness. Right ventricular systolic function is normal. There is moderately elevated pulmonary artery systolic pressure. The tricuspid regurgitant velocity is 3.05 m/s, and with an assumed right atrial pressure of 15 mmHg, the estimated right ventricular systolic pressure is 26.8 mmHg. Left Atrium: Left atrial size was mildly dilated. Right  Atrium: Right atrial size was dilated. Pericardium: There is no evidence of pericardial effusion. Mitral Valve: The mitral valve is normal in structure. Mild mitral valve regurgitation. No evidence of mitral valve stenosis. Tricuspid Valve: The tricuspid valve is grossly normal. Tricuspid valve regurgitation is moderate . No evidence of tricuspid stenosis. Aortic Valve: The aortic valve is tricuspid. Aortic valve regurgitation is not visualized. Aortic valve sclerosis is  present, with no evidence of aortic valve stenosis. Aortic valve mean gradient measures 3.5 mmHg. Aortic valve peak gradient measures 6.2  mmHg. Aortic valve area, by VTI measures 2.71 cm. Pulmonic Valve: The pulmonic valve was grossly normal. Pulmonic valve regurgitation is not visualized. No evidence of pulmonic stenosis. Aorta: The aortic root and ascending aorta are structurally normal, with no evidence of dilitation. Venous: The inferior vena cava is dilated in size with less than 50% respiratory variability, suggesting right atrial pressure of 15 mmHg. IAS/Shunts: No atrial level shunt detected by color flow Doppler. EKG: Rhythm strip during this exam demonstrates atrial fibrillation.  LEFT VENTRICLE PLAX 2D LVIDd:         4.50 cm      Diastology LVIDs:         3.80 cm      LV e' medial:    6.42 cm/s LV PW:         1.20 cm      LV E/e' medial:  14.0 LV IVS:        1.40 cm      LV e' lateral:   10.90 cm/s LVOT diam:     2.30 cm      LV E/e' lateral: 8.2 LV SV:         49 LV SV Index:   23 LVOT Area:     4.15 cm  LV Volumes (MOD) LV vol d, MOD A2C: 100.0 ml LV vol d, MOD A4C: 161.0 ml LV vol s, MOD A2C: 65.8 ml LV vol s, MOD A4C: 99.6 ml LV SV MOD A2C:     34.2 ml LV SV MOD A4C:     161.0 ml LV SV MOD BP:      49.2 ml RIGHT VENTRICLE RV Basal diam:  4.50 cm RV Mid diam:    3.20 cm RV S prime:     13.40 cm/s TAPSE (M-mode): 1.8 cm LEFT ATRIUM             Index        RIGHT ATRIUM           Index LA diam:        3.40 cm 1.57 cm/m   RA Area:     37.10 cm LA Vol (A2C):   96.8 ml 44.66 ml/m  RA Volume:   141.00 ml 65.05 ml/m LA Vol (A4C):   43.1 ml 19.88 ml/m LA Biplane Vol: 68.0 ml 31.37 ml/m  AORTIC VALVE                    PULMONIC VALVE AV Area (Vmax):    2.71 cm     PV Vmax:       0.86 m/s AV Area (Vmean):   2.57 cm     PV Peak grad:  2.9 mmHg AV Area (VTI):     2.71 cm AV Vmax:           124.25 cm/s AV Vmean:          87.775 cm/s AV VTI:  0.180 m AV Peak Grad:      6.2 mmHg AV Mean Grad:       3.5 mmHg LVOT Vmax:         81.03 cm/s LVOT Vmean:        54.300 cm/s LVOT VTI:          0.118 m LVOT/AV VTI ratio: 0.65  AORTA Ao Root diam: 4.10 cm Ao Asc diam:  3.40 cm MITRAL VALVE               TRICUSPID VALVE MV Area (PHT): 5.79 cm    TR Peak grad:   37.2 mmHg MV Decel Time: 131 msec    TR Vmax:        305.00 cm/s MR Peak grad: 70.2 mmHg MR Vmax:      419.00 cm/s  SHUNTS MV E velocity: 89.70 cm/s  Systemic VTI:  0.12 m MV A velocity: 26.30 cm/s  Systemic Diam: 2.30 cm MV E/A ratio:  3.41 Sunit Tolia DO Electronically signed by Rex Kras DO Signature Date/Time: 09/26/2021/12:13:41 PM    Final    DG Chest Port 1 View  Result Date: 09/26/2021 CLINICAL DATA:  Acute hypoxic respiratory failure with CHF. EXAM: PORTABLE CHEST 1 VIEW COMPARISON:  Portable chest yesterday at 7:53 a.m. FINDINGS: 5:13 a.m.  There is moderate to severe cardiomegaly. There is improvement in central vascular prominence and mild basilar interstitial edema with improvement with regression of edema from the upper lung fields and centrally. There are unchanged small pleural effusions and patchy dense consolidation in the left lower lobe. Perihilar ground-glass opacities are improved and probably due to resolving ground-glass edema. The mediastinum is stable with aortic tortuosity, atherosclerosis ectasia. There is advanced thoracic spondylosis.  Advanced shoulder DJD. IMPRESSION: 1. Improving vascular congestion and interstitial edema. 2. Small pleural effusions are unchanged. 3. Left lower lobe patchy dense consolidation is unchanged. Electronically Signed   By: Telford Nab M.D.   On: 09/26/2021 06:51   CT Head Wo Contrast  Result Date: 09/25/2021 CLINICAL DATA:  Altered mental status.  Nontraumatic. EXAM: CT HEAD WITHOUT CONTRAST TECHNIQUE: Contiguous axial images were obtained from the base of the skull through the vertex without intravenous contrast. RADIATION DOSE REDUCTION: This exam was performed according to the  departmental dose-optimization program which includes automated exposure control, adjustment of the mA and/or kV according to patient size and/or use of iterative reconstruction technique. COMPARISON:  08/30/2021 FINDINGS: Brain: No evidence of acute infarction, hemorrhage, hydrocephalus, extra-axial collection or mass lesion/mass effect.Encephalomalacia within the left parietal subcortical white matter and cortex compatible with recent posterior left MCA territory infarct, image 22/3. Remote left basal ganglia lacunar infarct noted. There is mild diffuse low-attenuation within the subcortical and periventricular white matter compatible with chronic microvascular disease. Prominence of the sulci and ventricles compatible with brain atrophy. Vascular: No hyperdense vessel or unexpected calcification. Skull: Normal. Negative for fracture or focal lesion. Sinuses/Orbits: Chronic mucoperiosteal thickening involving the left maxillary sinus noted. Mastoid air cells are clear. Other: None. IMPRESSION: 1. No acute intracranial abnormalities. 2. Encephalomalacia within the left parietal subcortical white matter and cortex compatible with recent posterior left MCA territory infarct. 3. Chronic small vessel ischemic disease and brain atrophy. Electronically Signed   By: Kerby Moors M.D.   On: 09/25/2021 10:42   DG Chest 1 View  Result Date: 09/25/2021 CLINICAL DATA:  Altered mental status, short of breath EXAM: CHEST  1 VIEW COMPARISON:  Prior chest x-ray 08/26/2021 FINDINGS: Similar degree of marked  cardiomegaly. Interval increase in diffuse pulmonary vascular congestion and interstitial airspace opacities with Kerley B-lines peripherally. Findings are consistent with interstitial pulmonary edema. No large pneumothorax or pleural effusion. No acute osseous abnormality. IMPRESSION: Pulmonary edema and cardiomegaly consistent with CHF. Electronically Signed   By: Jacqulynn Cadet M.D.   On: 09/25/2021 08:00     Microbiology: Recent Results (from the past 240 hour(s))  MRSA Next Gen by PCR, Nasal     Status: None   Collection Time: 10/14/21  9:20 PM   Specimen: Nasal Mucosa; Nasal Swab  Result Value Ref Range Status   MRSA by PCR Next Gen NOT DETECTED NOT DETECTED Final    Comment: (NOTE) The GeneXpert MRSA Assay (FDA approved for NASAL specimens only), is one component of a comprehensive MRSA colonization surveillance program. It is not intended to diagnose MRSA infection nor to guide or monitor treatment for MRSA infections. Test performance is not FDA approved in patients less than 30 years old. Performed at [facility], [place] [address] [place], [place] [address]      Labs: Basic Metabolic Panel: Recent Labs  Lab 10/14/21 0748 10/14/21 1627 10/15/21 0048 10/15/21 0111 10/16/21 0034 10/17/21 0010 10/18/21 0016  NA 140  --  141  --  138 138 137  K 3.6  --  3.7  --  3.5 3.7 3.7  CL 105  --  100  --  99 99 98  CO2 28  --  31  --  31 30 30   GLUCOSE 165*  --  128*  --  152* 150* 136*  BUN 16  --  14  --  18 21 22   CREATININE 1.02  --  1.38*  --  1.64* 1.65* 1.57*  CALCIUM 9.2  --  9.5  --  9.2 9.5 9.5  MG  --  1.7  --  1.6* 1.7 1.9  --    Liver Function Tests: No results for input(s): "AST", "ALT", "ALKPHOS", "BILITOT", "PROT", "ALBUMIN" in the last 168 hours. No results for input(s): "LIPASE", "AMYLASE" in the last 168 hours. No results for input(s): "AMMONIA" in the last 168 hours. CBC: Recent Labs  Lab 10/13/21 1823 10/14/21 0748 10/15/21 0048 10/18/21 0016  WBC 6.7 5.1 5.8 4.2  NEUTROABS 4.7 3.6  --   --   HGB 12.2* 12.3* 12.4* 12.2*  HCT 38.7* 37.9* 38.4* 37.7*  MCV 88.6 86.3 88.5 87.3  PLT 205 196 207 246   Cardiac Enzymes: No results for input(s): "CKTOTAL", "CKMB", "CKMBINDEX", "TROPONINI" in the last 168 hours. BNP: BNP (last 3 results) Recent Labs    09/25/21 0745 09/28/21 0510 10/13/21 1823  BNP 1,146.4* 668.1* 1,356.5*    ProBNP (last 3  results) No results for input(s): "PROBNP" in the last 8760 hours.  CBG: Recent Labs  Lab 10/17/21 0609 10/17/21 1144 10/17/21 1641 10/17/21 2131 10/18/21 0628  GLUCAP 105* 143* 116* 184* 124*       Signed:  Domenic Polite MD.  [facility] 10/18/2021, 2:15 PM

## 2021-10-18 NOTE — Plan of Care (Signed)
  Problem: Respiratory: Goal: Will maintain a patent airway Outcome: Progressing   Problem: Clinical Measurements: Goal: Respiratory complications will improve Outcome: Progressing Goal: Cardiovascular complication will be avoided Outcome: Progressing   Problem: Activity: Goal: Risk for activity intolerance will decrease Outcome: Progressing   Problem: Nutrition: Goal: Adequate nutrition will be maintained Outcome: Progressing   Problem: Elimination: Goal: Will not experience complications related to urinary retention Outcome: Progressing

## 2021-10-18 NOTE — Care Management Important Message (Signed)
Important Message  Patient Details  Name: Jordan Hoffman MRN: [id number] Date of Birth: 04-25-1928   Medicare Important Message Given:  Yes     Casmira Cramer 10/18/2021, 2:16 PM

## 2021-10-18 NOTE — Progress Notes (Signed)
Mobility Specialist Progress Note    10/18/21 1010  Mobility  Activity Ambulated with assistance in hallway  Activity Response Tolerated well  Distance Ambulated (ft) 200 ft  $Mobility charge 1 Mobility  Level of Assistance Contact guard assist, steadying assist  Assistive Device Front wheel walker  Mobility Referral Yes   Pre-Mobility: 89 HR, 94% SpO2 During Mobility: 104 HR Post-Mobility: 97 HR  Pt received in bed and agreeable. C/o some hip pain during. Returned to sitting EOB with call bell in reach.    Hildred Alamin Mobility Specialist

## 2021-10-18 NOTE — Evaluation (Signed)
Occupational Therapy Evaluation Patient Details Name: Jordan Hoffman MRN: [id number] DOB: 1928-09-29 Today's Date: 10/18/2021   History of Present Illness Pt is a 88 y/o M presenting to [facility] on 10/13/21 with pt/famiy statements of SOB, DOE, increase LE swelling and orthopnea. Admitted for acute on chronic HFrEF. Chest x-ray impressions indicate cardiomegaly with small pleural effusions, diffuse pulmonary edema, and suspcious for pneumonia. Of note, recent admission 08/27/21 and 09/25/21 with lap chole, hernia repair, afib, NSTEMI, acute L side stroke, COVID+. Other significant PMH to include AFib, COPD, T2DM, CKD stage 3a, HLD, HTN, back and knee pain.   Clinical Impression   Jordan Hoffman was evaluated s/p the above admission list, he has had recent hospitalizations and rehab but discharged home for 2 days prior to this hospitalization. Upon evaluation he had functional limitations due to generalized weakness and decreased activity tolerance. Overall he required minG- supervision A for transfers, mobility and grooming at the sink with RW. He will benefit from OT acutely should his LOS continue. Recommend d/c to home with continued [place] therapies and assist from family.    Recommendations for follow up therapy are one component of a multi-disciplinary discharge planning process, led by the attending physician.  Recommendations may be updated based on patient status, additional functional criteria and insurance authorization.   Follow Up Recommendations  Home health OT    Assistance Recommended at Discharge Frequent or constant Supervision/Assistance  Patient can return home with the following A little help with walking and/or transfers;A little help with bathing/dressing/bathroom;Help with stairs or ramp for entrance;Assist for transportation;Assistance with cooking/housework    Functional Status Assessment  Patient has had a recent decline in their functional status and demonstrates the ability to make  significant improvements in function in a reasonable and predictable amount of time.  Equipment Recommendations  None recommended by OT    Recommendations for Other Services       Precautions / Restrictions Precautions Precautions: Fall Restrictions Weight Bearing Restrictions: No      Mobility Bed Mobility Overal bed mobility: Modified Independent                  Transfers Overall transfer level: Needs assistance Equipment used: Rolling walker (2 wheels) Transfers: Sit to/from Stand Sit to Stand: Min guard                  Balance Overall balance assessment: Needs assistance Sitting-balance support: No upper extremity supported Sitting balance-Leahy Scale: Good     Standing balance support: No upper extremity supported, During functional activity Standing balance-Leahy Scale: Fair                             ADL either performed or assessed with clinical judgement   ADL Overall ADL's : Needs assistance/impaired Eating/Feeding: Independent;Sitting   Grooming: Min guard;Standing   Upper Body Bathing: Set up;Sitting   Lower Body Bathing: Min guard;Sit to/from stand   Upper Body Dressing : Set up;Sitting   Lower Body Dressing: Min guard;Sit to/from stand   Toilet Transfer: Min guard;Ambulation;Rolling walker (2 wheels)   Toileting- Clothing Manipulation and Hygiene: Supervision/safety;Sitting/lateral lean       Functional mobility during ADLs: Min guard;Rolling walker (2 wheels) General ADL Comments: min G for safety.     Vision Baseline Vision/History: 0 No visual deficits Vision Assessment?: No apparent visual deficits     Perception     Praxis      Pertinent Vitals/Pain Pain  Assessment Pain Assessment: Faces Faces Pain Scale: No hurt Pain Intervention(s): Monitored during session     Hand Dominance Left   Extremity/Trunk Assessment Upper Extremity Assessment Upper Extremity Assessment: Overall WFL for tasks  assessed   Lower Extremity Assessment Lower Extremity Assessment: Defer to PT evaluation   Cervical / Trunk Assessment Cervical / Trunk Assessment: Normal   Communication Communication Communication: No difficulties   Cognition Arousal/Alertness: Awake/alert Behavior During Therapy: WFL for tasks assessed/performed Overall Cognitive Status: Within Functional Limits for tasks assessed                                 General Comments: Follows all commands     General Comments  resting HR 90s, HR with activity 100-110. HR standing with ADLs read 200 however lead off. Pt sat at sink, lead placed back on with HR at 110.    Exercises     Shoulder Instructions      Home Living Family/patient expects to be discharged to:: Private residence Living Arrangements: Spouse/significant other Available Help at Discharge: Family;Available 24 hours/day Type of Home: House Home Access: Level entry     Home Layout: One level;Other (Comment)     Bathroom Shower/Tub: Occupational psychologist: Handicapped height Bathroom Accessibility: Yes   Home Equipment: [place] - single Barista (2 wheels);Rollator (4 wheels);Grab bars - tub/shower;Shower seat   Additional Comments: reports shower chair is too small      Prior Functioning/Environment Prior Level of Function : Needs assist             Mobility Comments: Pt at SNF post CVA and +COVID complications, was home two days before current admission. Walking with RW and help ADLs Comments: Was independent with ADLs and light IADLs prior to recent series of hosptial admissions        OT Problem List: Decreased strength;Decreased range of motion;Decreased activity tolerance;Cardiopulmonary status limiting activity      OT Treatment/Interventions: Self-care/ADL training;Therapeutic exercise;DME and/or AE instruction;Therapeutic activities;Visual/perceptual remediation/compensation;Patient/family  education;Balance training;Energy conservation    OT Goals(Current goals can be found in the care plan section) Acute Rehab OT Goals Patient Stated Goal: home OT Goal Formulation: With patient Time For Goal Achievement: 10/12/21 Potential to Achieve Goals: Good ADL Goals Pt Will Perform Grooming: with supervision;standing Pt Will Perform Upper Body Dressing: sitting;Independently Pt Will Perform Lower Body Dressing: with supervision;sit to/from stand Pt Will Transfer to Toilet: with modified independence;regular height toilet;bedside commode Additional ADL Goal #1: pt will demonstrate increased activity tolerance to complete at least 2-3 ADLs in standing without sitting rest break given general supervision for safety  OT Frequency: Min 2X/week    Co-evaluation              AM-PAC OT "6 Clicks" Daily Activity     Outcome Measure Help from another person eating meals?: None Help from another person taking care of personal grooming?: A Little Help from another person toileting, which includes using toliet, bedpan, or urinal?: A Little Help from another person bathing (including washing, rinsing, drying)?: A Little Help from another person to put on and taking off regular upper body clothing?: A Little Help from another person to put on and taking off regular lower body clothing?: A Little 6 Click Score: 19   End of Session Equipment Utilized During Treatment: Rolling walker (2 wheels);Gait belt Nurse Communication: Mobility status  Activity Tolerance: Patient tolerated treatment well Patient left:  in bed;with call bell/phone within reach;with family/visitor present  OT Visit Diagnosis: Unsteadiness on feet (R26.81);Other abnormalities of gait and mobility (R26.89);Muscle weakness (generalized) (M62.81)                Time: 0981-1914 OT Time Calculation (min): 20 min Charges:  OT General Charges $OT Visit: 1 Visit OT Evaluation $OT Eval Moderate Complexity: 1  Mod    Purity Irmen D Causey 10/18/2021, 10:04 AM

## 2021-10-31 ENCOUNTER — Ambulatory Visit: Payer: No Typology Code available for payment source | Admitting: Cardiology

## 2021-11-03 ENCOUNTER — Ambulatory Visit: Payer: No Typology Code available for payment source | Admitting: Internal Medicine

## 2021-11-09 ENCOUNTER — Encounter (HOSPITAL_COMMUNITY): Payer: Self-pay

## 2021-11-09 ENCOUNTER — Emergency Department (HOSPITAL_COMMUNITY): Payer: No Typology Code available for payment source

## 2021-11-09 ENCOUNTER — Inpatient Hospital Stay (HOSPITAL_COMMUNITY)
Admission: EM | Admit: 2021-11-09 | Discharge: 2021-11-11 | DRG: 683 | Disposition: A | Discharge status: Home Health | Payer: No Typology Code available for payment source | Attending: Internal Medicine | Admitting: Internal Medicine

## 2021-11-09 ENCOUNTER — Other Ambulatory Visit: Payer: Self-pay

## 2021-11-09 DIAGNOSIS — I5022 Chronic systolic (congestive) heart failure: Secondary | ICD-10-CM | POA: Diagnosis not present

## 2021-11-09 DIAGNOSIS — Z801 Family history of malignant neoplasm of trachea, bronchus and lung: Secondary | ICD-10-CM

## 2021-11-09 DIAGNOSIS — M549 Dorsalgia, unspecified: Secondary | ICD-10-CM | POA: Diagnosis present

## 2021-11-09 DIAGNOSIS — Z9049 Acquired absence of other specified parts of digestive tract: Secondary | ICD-10-CM

## 2021-11-09 DIAGNOSIS — I959 Hypotension, unspecified: Secondary | ICD-10-CM | POA: Diagnosis present

## 2021-11-09 DIAGNOSIS — Z8616 Personal history of COVID-19: Secondary | ICD-10-CM

## 2021-11-09 DIAGNOSIS — I480 Paroxysmal atrial fibrillation: Secondary | ICD-10-CM

## 2021-11-09 DIAGNOSIS — Z79899 Other long term (current) drug therapy: Secondary | ICD-10-CM

## 2021-11-09 DIAGNOSIS — J449 Chronic obstructive pulmonary disease, unspecified: Secondary | ICD-10-CM | POA: Diagnosis present

## 2021-11-09 DIAGNOSIS — Z96651 Presence of right artificial knee joint: Secondary | ICD-10-CM | POA: Diagnosis present

## 2021-11-09 DIAGNOSIS — E7800 Pure hypercholesterolemia, unspecified: Secondary | ICD-10-CM | POA: Diagnosis present

## 2021-11-09 DIAGNOSIS — Z823 Family history of stroke: Secondary | ICD-10-CM

## 2021-11-09 DIAGNOSIS — Z96642 Presence of left artificial hip joint: Secondary | ICD-10-CM | POA: Diagnosis present

## 2021-11-09 DIAGNOSIS — I10 Essential (primary) hypertension: Secondary | ICD-10-CM | POA: Diagnosis present

## 2021-11-09 DIAGNOSIS — Z881 Allergy status to other antibiotic agents status: Secondary | ICD-10-CM

## 2021-11-09 DIAGNOSIS — N179 Acute kidney failure, unspecified: Principal | ICD-10-CM | POA: Diagnosis present

## 2021-11-09 DIAGNOSIS — Z7951 Long term (current) use of inhaled steroids: Secondary | ICD-10-CM

## 2021-11-09 DIAGNOSIS — E860 Dehydration: Secondary | ICD-10-CM | POA: Diagnosis present

## 2021-11-09 DIAGNOSIS — E785 Hyperlipidemia, unspecified: Secondary | ICD-10-CM | POA: Diagnosis present

## 2021-11-09 DIAGNOSIS — G4733 Obstructive sleep apnea (adult) (pediatric): Secondary | ICD-10-CM | POA: Diagnosis present

## 2021-11-09 DIAGNOSIS — N1831 Chronic kidney disease, stage 3a: Secondary | ICD-10-CM | POA: Diagnosis present

## 2021-11-09 DIAGNOSIS — Z7901 Long term (current) use of anticoagulants: Secondary | ICD-10-CM

## 2021-11-09 DIAGNOSIS — Z8673 Personal history of transient ischemic attack (TIA), and cerebral infarction without residual deficits: Secondary | ICD-10-CM

## 2021-11-09 DIAGNOSIS — I130 Hypertensive heart and chronic kidney disease with heart failure and stage 1 through stage 4 chronic kidney disease, or unspecified chronic kidney disease: Secondary | ICD-10-CM | POA: Diagnosis present

## 2021-11-09 DIAGNOSIS — Z885 Allergy status to narcotic agent status: Secondary | ICD-10-CM

## 2021-11-09 DIAGNOSIS — Z841 Family history of disorders of kidney and ureter: Secondary | ICD-10-CM

## 2021-11-09 DIAGNOSIS — M6281 Muscle weakness (generalized): Secondary | ICD-10-CM | POA: Diagnosis present

## 2021-11-09 DIAGNOSIS — E1122 Type 2 diabetes mellitus with diabetic chronic kidney disease: Secondary | ICD-10-CM | POA: Diagnosis present

## 2021-11-09 DIAGNOSIS — Z880 Allergy status to penicillin: Secondary | ICD-10-CM

## 2021-11-09 DIAGNOSIS — E119 Type 2 diabetes mellitus without complications: Secondary | ICD-10-CM

## 2021-11-09 DIAGNOSIS — Z882 Allergy status to sulfonamides status: Secondary | ICD-10-CM

## 2021-11-09 DIAGNOSIS — I679 Cerebrovascular disease, unspecified: Secondary | ICD-10-CM | POA: Diagnosis present

## 2021-11-09 DIAGNOSIS — Z888 Allergy status to other drugs, medicaments and biological substances status: Secondary | ICD-10-CM

## 2021-11-09 DIAGNOSIS — Z87891 Personal history of nicotine dependence: Secondary | ICD-10-CM

## 2021-11-09 DIAGNOSIS — J849 Interstitial pulmonary disease, unspecified: Secondary | ICD-10-CM | POA: Diagnosis present

## 2021-11-09 DIAGNOSIS — Z66 Do not resuscitate: Secondary | ICD-10-CM | POA: Diagnosis present

## 2021-11-09 DIAGNOSIS — I4891 Unspecified atrial fibrillation: Secondary | ICD-10-CM | POA: Diagnosis present

## 2021-11-09 LAB — CBC WITH DIFFERENTIAL/PLATELET
Abs Immature Granulocytes: 0.02 10*3/uL (ref 0.00–0.07)
Basophils Absolute: 0.1 10*3/uL (ref 0.0–0.1)
Basophils Relative: 2 %
Eosinophils Absolute: 0.1 10*3/uL (ref 0.0–0.5)
Eosinophils Relative: 2 %
HCT: 45.2 % (ref 39.0–52.0)
Hemoglobin: 14 g/dL (ref 13.0–17.0)
Immature Granulocytes: 0 %
Lymphocytes Relative: 30 %
Lymphs Abs: 1.6 10*3/uL (ref 0.7–4.0)
MCH: 27.7 pg (ref 26.0–34.0)
MCHC: 31 g/dL (ref 30.0–36.0)
MCV: 89.3 fL (ref 80.0–100.0)
Monocytes Absolute: 0.8 10*3/uL (ref 0.1–1.0)
Monocytes Relative: 14 %
Neutro Abs: 2.9 10*3/uL (ref 1.7–7.7)
Neutrophils Relative %: 52 %
Platelets: 266 10*3/uL (ref 150–400)
RBC: 5.06 MIL/uL (ref 4.22–5.81)
RDW: 15.7 % — ABNORMAL HIGH (ref 11.5–15.5)
WBC: 5.4 10*3/uL (ref 4.0–10.5)
nRBC: 0 % (ref 0.0–0.2)

## 2021-11-09 LAB — LACTIC ACID, PLASMA: Lactic Acid, Venous: 1.9 mmol/L (ref 0.5–1.9)

## 2021-11-09 LAB — COMPREHENSIVE METABOLIC PANEL
ALT: 12 U/L (ref 0–44)
AST: 16 U/L (ref 15–41)
Albumin: 3.4 g/dL — ABNORMAL LOW (ref 3.5–5.0)
Alkaline Phosphatase: 100 U/L (ref 38–126)
Anion gap: 12 (ref 5–15)
BUN: 59 mg/dL — ABNORMAL HIGH (ref 8–23)
CO2: 25 mmol/L (ref 22–32)
Calcium: 10.1 mg/dL (ref 8.9–10.3)
Chloride: 97 mmol/L — ABNORMAL LOW (ref 98–111)
Creatinine, Ser: 2.25 mg/dL — ABNORMAL HIGH (ref 0.61–1.24)
GFR, Estimated: 27 mL/min — ABNORMAL LOW (ref >60)
Glucose, Bld: 152 mg/dL — ABNORMAL HIGH (ref 70–99)
Potassium: 5.2 mmol/L — ABNORMAL HIGH (ref 3.5–5.1)
Sodium: 134 mmol/L — ABNORMAL LOW (ref 135–145)
Total Bilirubin: 0.7 mg/dL (ref 0.3–1.2)
Total Protein: 7.3 g/dL (ref 6.5–8.1)

## 2021-11-09 LAB — TROPONIN I (HIGH SENSITIVITY): Troponin I (High Sensitivity): 42 ng/L — ABNORMAL HIGH (ref <18)

## 2021-11-09 LAB — BRAIN NATRIURETIC PEPTIDE: B Natriuretic Peptide: 223.8 pg/mL — ABNORMAL HIGH (ref 0.0–100.0)

## 2021-11-09 LAB — CBG MONITORING, ED: Glucose-Capillary: 114 mg/dL — ABNORMAL HIGH (ref 70–99)

## 2021-11-09 MED ORDER — ONDANSETRON HCL 4 MG/2ML IJ SOLN: 4.0000 mg | Freq: Four times a day (QID) | INTRAMUSCULAR | Status: DC | PRN

## 2021-11-09 MED ORDER — APIXABAN 2.5 MG PO TABS
2.5000 mg | ORAL_TABLET | Freq: Two times a day (BID) | ORAL | Status: DC
  Administered 2021-11-10 – 2021-11-11 (×4): 2.5 mg via ORAL
  Filled 2021-11-09 (×6): qty 1

## 2021-11-09 MED ORDER — ACETAMINOPHEN 650 MG RE SUPP: 650.0000 mg | Freq: Four times a day (QID) | RECTAL | Status: DC | PRN

## 2021-11-09 MED ORDER — ACETAMINOPHEN 325 MG PO TABS
650.0000 mg | ORAL_TABLET | Freq: Four times a day (QID) | ORAL | Status: DC | PRN
  Filled 2021-11-09: qty 2

## 2021-11-09 MED ORDER — METOPROLOL TARTRATE 25 MG PO TABS
37.5000 mg | ORAL_TABLET | Freq: Two times a day (BID) | ORAL | Status: DC
  Administered 2021-11-10 – 2021-11-11 (×3): 37.5 mg via ORAL
  Filled 2021-11-09 (×3): qty 2

## 2021-11-09 MED ORDER — ONDANSETRON HCL 4 MG PO TABS: 4.0000 mg | ORAL_TABLET | Freq: Four times a day (QID) | ORAL | Status: DC | PRN

## 2021-11-09 MED ORDER — INSULIN ASPART 100 UNIT/ML IJ SOLN
0.0000 [IU] | Freq: Three times a day (TID) | INTRAMUSCULAR | Status: DC
  Administered 2021-11-10: 1 [IU] via SUBCUTANEOUS
  Administered 2021-11-10: 2 [IU] via SUBCUTANEOUS
  Administered 2021-11-10: 1 [IU] via SUBCUTANEOUS
  Administered 2021-11-11: 2 [IU] via SUBCUTANEOUS

## 2021-11-09 MED ORDER — LACTATED RINGERS IV BOLUS
500.0000 mL | Freq: Once | INTRAVENOUS | Status: AC
  Administered 2021-11-09: 500 mL via INTRAVENOUS

## 2021-11-09 NOTE — ED Provider Triage Note (Signed)
Emergency Medicine Provider Triage Evaluation Note  Jordan Hoffman , a 88 y.o. male  was evaluated in triage.  Pt complains of increasing weakness over the last week.  Hx of A-fib on Eliquis, COPD, DMT2, CHF, CKD.  When he was at PT today, reported more weakness on the right side of his body than the left, however still endorses general weakness overall.  Now with some midline neck pain.  Wife present in the room, reports there is no new weakness today that this is been going on for at least 3 days.  Blood pressure noted to be 77/58 in the room.  Denies abdominal pain, chest pain, shortness of breath, headache, back pain, vision changes, recent fevers, urinary symptoms, or changes in bowel habits.  Review of Systems  Positive:  Negative: See above  Physical Exam  BP (!) 77/58 (BP Location: Right Arm)   Pulse 71   Temp 98.1 F (36.7 C) (Oral)   Resp 18   Ht 6\' 4"  (1.93 m)   Wt 78.5 kg   SpO2 97%   BMI 21.06 kg/m  Gen:   Awake, no distress, groggy appearing Resp:  Normal effort, equal chest rise, CTAB MSK:   Moves extremities with notable difficulty in general, again weak appearing Other:  No palpable mass.  Radial pulses 2+ bilaterally.  HR irregularly irregular.  No impressive edema of lower extremities.  Chest non-TTP.  Abdomen soft, non-TTP.  Medical Decision Making  Medically screening exam initiated at 7:12 PM.  Appropriate orders placed.  Jordan Hoffman was informed that the remainder of the evaluation will be completed by another provider, this initial triage assessment does not replace that evaluation, and the importance of remaining in the ED until their evaluation is complete.  Patient significantly hypotensive, concerning.  Discussed patient with triage nurse, needs to come back to next billable room since possible.   Jordan Rome, PA-C 42/59/56 [id number]

## 2021-11-09 NOTE — Assessment & Plan Note (Signed)
Sensitive SSI AC 

## 2021-11-09 NOTE — ED Provider Notes (Signed)
[facility] EMERGENCY DEPARTMENT Provider Note   CSN: [id number] Arrival date & time: 11/09/21  1757     History  Chief Complaint  Patient presents with   Weakness    Jordan Hoffman is a 88 y.o. male.   history significant of AF on eliquis, COPD, T2DM, CKD stage 3a, ACD, OSA, HLD, HTN, recent covid infection 1 month ago, recent CVA in setting of complicated hospital stay in August for cholecystitis. Underwent lap cholecystectomy and hernia repair on 04/25/79 complicated by A-fib with RVR and NSTEMI. Code stroke on 8/14 with MRI brain showing acute left sided infarcts. Discharged to SNF. Admitted to [facility] on 9/10 for acute respiratory failure due to acute on chronic CHF exacerbation +covid 19.  Family reports increased generalized weakness for the past 2 or 3 days.  Noted to be weaker on the right side compared to left at physical therapy today.  He feels generally weak all over.  No recent fall or trauma.  He denies any chest pain, shortness of breath, abdominal pain, headache, fever, urinary symptoms. They have not noticed any vomiting.  No black or bloody stools.  No diarrhea.  He has been home from the hospital for approximately 3 weeks and states compliance with his medications.  Found to be hypotensive in the 70s in triage. Denies any dizziness or lightheadedness.  The history is provided by the patient and a relative.  Weakness Associated symptoms: no abdominal pain, no arthralgias, no chest pain, no cough, no dizziness, no dysuria, no fever, no myalgias, no nausea, no shortness of breath and no vomiting        Home Medications Prior to Admission medications   Medication Sig Start Date End Date Taking? Authorizing Provider  acetaminophen (TYLENOL) 325 MG tablet Take 650 mg by mouth every 6 (six) hours as needed for mild pain or moderate pain.    [provider]  albuterol (PROVENTIL) (2.5 MG/3ML) 0.083% nebulizer solution Take 2.5 mg by nebulization  every 4 (four) hours as needed for wheezing or shortness of breath.    [provider]  albuterol (VENTOLIN HFA) 108 (90 Base) MCG/ACT inhaler Inhale 2 puffs into the lungs every 4 (four) hours as needed for wheezing or shortness of breath.    [provider]  ALVESCO 160 MCG/ACT inhaler Inhale 2 puffs into the lungs 2 (two) times daily. 09/07/21   [provider]  apixaban (ELIQUIS) 2.5 MG TABS tablet Take 1 tablet (2.5 mg total) by mouth 2 (two) times daily. 07/31/20   [name], MD  carboxymethylcellulose (REFRESH PLUS) 0.5 % SOLN Place 1 drop into both eyes as needed (for dry eyes). 08/04/19   [provider]  cholecalciferol (VITAMIN D) 25 MCG (1000 UNIT) tablet Take 1,000 Units by mouth See admin instructions. 1 by mouth two times a week on Saturday and Sunday for supplement.    [provider]  diclofenac Sodium (VOLTAREN) 1 % GEL Apply 2 g topically 4 (four) times daily as needed (shoulder pain).    [provider]  docusate sodium (COLACE) 100 MG capsule Take 1 capsule (100 mg total) by mouth 2 (two) times daily as needed for mild constipation. 09/28/21   Thurnell Lose, MD  fluticasone (FLONASE) 50 MCG/ACT nasal spray Place 2 sprays into both nostrils daily.    [provider]  furosemide (LASIX) 40 MG tablet Take 1 tablet (40 mg total) by mouth daily. 10/18/21   Domenic Polite, MD  gabapentin (NEURONTIN) 300  MG capsule Take 300 mg by mouth 3 (three) times daily.    [provider]  guaiFENesin (MUCINEX) 600 MG 12 hr tablet Take 600 mg by mouth 2 (two) times daily.    [provider]  latanoprost (XALATAN) 0.005 % ophthalmic solution Place 1 drop into both eyes at bedtime.    [provider]  loratadine (CLARITIN) 10 MG tablet Take 10 mg by mouth daily.    [provider]  Metoprolol Tartrate 37.5 MG TABS Take 1 tablet by mouth 2 (two) times daily. 09/06/21   [provider]   omeprazole (PRILOSEC) 20 MG capsule Take 20 mg by mouth daily.    [provider]  Oyster Shell 500 MG TABS Take 500 mg by mouth 2 (two) times daily.    [provider]  polyethylene glycol (MIRALAX / GLYCOLAX) 17 g packet Take 17 g by mouth daily as needed for moderate constipation. 09/28/21   Thurnell Lose, MD  PSYLLIUM HUSK PO Take 1 capsule by mouth as needed (for constipation).    [provider]  sodium chloride (OCEAN) 0.65 % SOLN nasal spray Place 2 sprays into both nostrils 2 (two) times daily.    [provider]  spironolactone (ALDACTONE) 25 MG tablet Take 0.5 tablets (12.5 mg total) by mouth daily. 10/18/21 10/18/22  Domenic Polite, MD  STIOLTO RESPIMAT 2.5-2.5 MCG/ACT AERS Inhale 2 puffs into the lungs daily. 09/06/21   [provider]      Allergies    Lisinopril, Penicillins, Pravastatin, Sulfa antibiotics, Atorvastatin, Codeine, Flunisolide, Niacin and related, Niaspan [niacin], Simvastatin, Statins, Ciprofloxacin, Erythromycin, and Ranitidine    Review of Systems   Review of Systems  Constitutional:  Positive for activity change, appetite change and fatigue. Negative for fever.  HENT:  Negative for congestion.   Respiratory:  Negative for cough, chest tightness and shortness of breath.   Cardiovascular:  Negative for chest pain.  Gastrointestinal:  Negative for abdominal pain, nausea and vomiting.  Genitourinary:  Negative for dysuria.  Musculoskeletal:  Negative for arthralgias and myalgias.  Skin:  Negative for rash.  Neurological:  Positive for weakness. Negative for dizziness and light-headedness.    Physical Exam Updated Vital Signs BP (!) 77/58 (BP Location: Right Arm)   Pulse 71   Temp 98.1 F (36.7 C) (Oral)   Resp 18   Ht 6\' 4"  (1.93 m)   Wt 78.5 kg   SpO2 97%   BMI 21.06 kg/m  Physical Exam Vitals and nursing note reviewed.  Constitutional:      General: He is not in acute distress.    Appearance: He  is well-developed. He is ill-appearing.     Comments: Chronically ill-appearing  HENT:     Head: Normocephalic and atraumatic.     Mouth/Throat:     Pharynx: No oropharyngeal exudate.  Eyes:     Conjunctiva/sclera: Conjunctivae normal.     Pupils: Pupils are equal, round, and reactive to light.     Comments: Questionable left eyelid ptosis  Neck:     Comments: No meningismus. Cardiovascular:     Rate and Rhythm: Normal rate. Rhythm irregular.     Heart sounds: Normal heart sounds. No murmur heard. Pulmonary:     Effort: Pulmonary effort is normal. No respiratory distress.     Breath sounds: Rhonchi present.  Chest:     Chest wall: No tenderness.  Abdominal:     Palpations: Abdomen is soft.     Tenderness: There is no  abdominal tenderness. There is no guarding or rebound.  Musculoskeletal:        General: No tenderness. Normal range of motion.     Cervical back: Normal range of motion and neck supple.     Right lower leg: No edema.     Left lower leg: No edema.  Skin:    General: Skin is warm.  Neurological:     Mental Status: He is alert and oriented to person, place, and time.     Cranial Nerves: No cranial nerve deficit.     Motor: No abnormal muscle tone.     Coordination: Coordination normal.     Comments: No appreciable facial droop, 5/5 strength throughout.  5/5 strength of upper and lower extremities able to hold against gravity bilaterally.  Equal grip strength bilaterally.  Psychiatric:        Behavior: Behavior normal.     ED Results / Procedures / Treatments   Labs (all labs ordered are listed, but only abnormal results are displayed) Labs Reviewed  COMPREHENSIVE METABOLIC PANEL - Abnormal; Notable for the following components:      Result Value   Sodium 134 (*)    Potassium 5.2 (*)    Chloride 97 (*)    Glucose, Bld 152 (*)    BUN 59 (*)    Creatinine, Ser 2.25 (*)    Albumin 3.4 (*)    GFR, Estimated 27 (*)    All other components within normal  limits  CBC WITH DIFFERENTIAL/PLATELET - Abnormal; Notable for the following components:   RDW 15.7 (*)    All other components within normal limits  BRAIN NATRIURETIC PEPTIDE - Abnormal; Notable for the following components:   B Natriuretic Peptide 223.8 (*)    All other components within normal limits  CBG MONITORING, ED - Abnormal; Notable for the following components:   Glucose-Capillary 114 (*)    All other components within normal limits  TROPONIN I (HIGH SENSITIVITY) - Abnormal; Notable for the following components:   Troponin I (High Sensitivity) 42 (*)    All other components within normal limits  CULTURE, BLOOD (ROUTINE X 2)  CULTURE, BLOOD (ROUTINE X 2)  LACTIC ACID, PLASMA  URINALYSIS, ROUTINE W REFLEX MICROSCOPIC  LACTIC ACID, PLASMA  HEMOGLOBIN Q2I  BASIC METABOLIC PANEL  TROPONIN I (HIGH SENSITIVITY)    EKG EKG Interpretation  Date/Time:  Wednesday November 09 2021 19:03:17 EDT Ventricular Rate:  74 PR Interval:    QRS Duration: 94 QT Interval:  414 QTC Calculation: 459 R Axis:   -58 Text Interpretation: Atrial fibrillation Left anterior fascicular block Minimal voltage criteria for LVH, may be normal variant ( Cornell product ) Possible Anterior infarct , age undetermined Abnormal ECG When compared with ECG of 13-Oct-2021 18:09, PREVIOUS ECG IS PRESENT No significant change was found Confirmed by Ezequiel Essex [phone]) on 11/09/2021 7:22:57 PM  Radiology CT Head Wo Contrast  Result Date: 11/09/2021 CLINICAL DATA:  Weakness x3 days. EXAM: CT HEAD WITHOUT CONTRAST TECHNIQUE: Contiguous axial images were obtained from the base of the skull through the vertex without intravenous contrast. RADIATION DOSE REDUCTION: This exam was performed according to the departmental dose-optimization program which includes automated exposure control, adjustment of the mA and/or kV according to patient size and/or use of iterative reconstruction technique. COMPARISON:  September 25, 2021 FINDINGS: Brain: There is mild to moderate severity cerebral atrophy with widening of the extra-axial spaces and ventricular dilatation. There are areas of decreased attenuation within the white matter  tracts of the supratentorial brain, consistent with microvascular disease changes. A chronic left posterior parietal infarct is seen. Vascular: There is moderate severity calcification of the bilateral cavernous carotid arteries. Skull: Normal. Negative for fracture or focal lesion. Sinuses/Orbits: There is marked severity left maxillary sinus and mild bilateral ethmoid sinus mucosal thickening. Marked severity chronic thickening of the walls of the left maxillary sinus is also seen. Other: None. IMPRESSION: 1. No acute intracranial abnormality. 2. Chronic left posterior parietal infarct. 3. Generalized cerebral atrophy and microvascular disease changes of the supratentorial brain. Electronically Signed   By: Virgina Norfolk M.D.   On: 11/09/2021 21:18   DG Chest Portable 1 View  Result Date: 11/09/2021 CLINICAL DATA:  Weakness for 3 days. EXAM: PORTABLE CHEST 1 VIEW COMPARISON:  10/17/2021 FINDINGS: Shallow inspiration. Cardiac enlargement. Interstitial changes in the lungs, similar to prior study, possibly chronic. Linear atelectasis or scarring in the lung bases. No focal consolidation. No pleural effusions. No pneumothorax. Mediastinal contours appear intact. Calcified and tortuous aorta. Degenerative changes in the spine and shoulders. IMPRESSION: No change in appearance of the chest since previous study. Cardiac enlargement. Diffuse interstitial pattern to the lungs with linear fibrosis or atelectasis in the lung bases. Electronically Signed   By: Lucienne Capers M.D.   On: 11/09/2021 20:20    Procedures Procedures    Medications Ordered in ED Medications - No data to display  ED Course/ Medical Decision Making/ A&P                           Medical Decision Making Amount and/or  Complexity of Data Reviewed Labs: ordered. Decision-making details documented in ED Course. Radiology: ordered and independent interpretation performed. Decision-making details documented in ED Course. ECG/medicine tests: ordered and independent interpretation performed. Decision-making details documented in ED Course.  Risk Decision regarding hospitalization.  Generalized weakness for the past 3 days, hypotensive to the 70s on arrival.  Complicated medical history including A-fib on Eliquis, CHF with EF of 35%, diabetes, COPD, CKD.  Feels generally weak but more so on the right side.  Last seen normal was 3 days ago.  Code stroke not activated due to delay in presentation.  Gentle IV fluids given for some concern for overdiuresis.  Creatinine today 2.2 from 1.5.  Physical therapist did report some right-sided weakness worse than left this is not appreciated on my exam.  CT head shows no acute pathology and no hemorrhage.  Does show old infarct.  Results reviewed interpreted by me.  This patient may be hypoperfusing and may be extending into old infarct due to his hypotension.  Blood pressure has improved to the 100s after gentle IV fluids.  Suspect likely overdiuresis causing his hypotension, renal failure and generalized weakness.  MRI will be obtained to assess for occult stroke he is not a candidate for any kind of intervention. We will hydrate slowly pending improvement in blood pressure as well as renal function.  Low suspicion for sepsis.  Lactate is normal.  Blood cultures were sent.  Chest x-ray does not show any change in his chronic interstitial opacities.  Admission discussed with Dr. Alcario Drought        Final Clinical Impression(s) / ED Diagnoses Final diagnoses:  AKI (acute kidney injury) [facility])  Dehydration    Rx / DC Orders ED Discharge Orders     None         Mattew Chriswell, Annie Main, MD 11/09/21 2230

## 2021-11-09 NOTE — Assessment & Plan Note (Signed)
Now with superimposed AKI.

## 2021-11-09 NOTE — Assessment & Plan Note (Signed)
Continue home nebs 

## 2021-11-09 NOTE — Assessment & Plan Note (Signed)
Suspect hypotension / pre-renal due to over diuresis from recent CHF admissions and lasix + aldactone use. Hypotension resolved in ED following ~750cc total IVF bolus volume. 1. Hold lasix + aldactone 2. Got 750cc IVF in ED, hypotension resolved. 3. Strict intake and output 4. Repeat BMP in AM

## 2021-11-09 NOTE — Assessment & Plan Note (Signed)
CPAP QHS

## 2021-11-09 NOTE — Assessment & Plan Note (Signed)
Hold home lasix and aldactone given initial hypotension in ED.

## 2021-11-09 NOTE — ED Triage Notes (Signed)
PER EMS: pt presents with c/o weakness x 3 days. During PT today he was having more weakness to the right side of his body and trouble coordinating. His wife reports this has been going on for the last 3 days and states no new weakness today. He reports feeling weak and tired.   BP- 116/64, HR- 60 (hx of afib), 97% RA, CBG-227

## 2021-11-09 NOTE — H&P (Signed)
History and Physical    Patient: Jordan Hoffman MGN:[id number] DOB: 30-Dec-1928 DOA: 11/09/2021 DOS: the patient was seen and examined on 11/09/2021 PCP: [facility]  Patient coming from: Home  Chief Complaint:  Chief Complaint  Patient presents with   Weakness   HPI: Jordan Hoffman is a 88 y.o. male with medical history significant of COPD, DM2, HTN, CKD 3a, A.Fib rate control with metoprolol and on eliquis, prior stroke in Aug.  Admit to [facility] in Aug for cholecystitis, had cholecystectomy complicated by acute stroke.  Admission in early sept from [facility] with EF 91-69% complicated by [id number], recurrent CHF exacerbation admission in late Sept.  pt presents with c/o weakness x 3 days. During PT today he was having more weakness to the right side of his body and trouble coordinating. His wife reports this has been going on for the last 3 days and states no new weakness today. He reports feeling weak and tired.   Review of Systems: As mentioned in the history of present illness. All other systems reviewed and are negative. Past Medical History:  Diagnosis Date   Atrial fibrillation (HCC)    Back pain    COPD (chronic obstructive pulmonary disease) (HCC)    Diabetes mellitus without complication ([place])    High cholesterol    Hypertension    Knee pain    Past Surgical History:  Procedure Laterality Date   BACK SURGERY     HIP SURGERY     JOINT REPLACEMENT Left    left hip replacement   MOUTH SURGERY     REPLACEMENT TOTAL KNEE Right    TRANSURETHRAL RESECTION OF PROSTATE     Social History:  reports that he has quit smoking. He has never used smokeless tobacco. He reports that he does not drink alcohol and does not use drugs.  Allergies  Allergen Reactions   Lisinopril Cough        Penicillins Other (See Comments)    Did it involve swelling of the face/tongue/throat, SOB, or low BP? Yes Did it involve sudden or severe rash/hives, skin peeling, or any reaction on the  inside of your mouth or nose? No Did you need to seek medical attention at a hospital or doctor's office? No When did it last happen? Unk  If all above answers are "NO", may proceed with cephalosporin use.   *pt tolerated ancef on 08/08/16    Pravastatin Hives        Sulfa Antibiotics Itching and Other (See Comments)    Itching, watery eyes   Atorvastatin Other (See Comments)    Muscle pain    Codeine Hives        Flunisolide Hives    epistaxis Other reaction(s): Bleeding from nose, Other (See Comments) Hives  Hives     Niacin And Related     Hives    Niaspan [Niacin] Hives and Other (See Comments)    Flushing, also   Simvastatin Hives    weakness Other reaction(s): Weakness present   Statins Hives    weakness Other reaction(s): Other (See Comments), Unknown Hives  weakness Hives  Hives  Hives     Ciprofloxacin Rash    Hives  Other reaction(s): Other (See Comments), Other (See Comments) Hives  Hives  Hives     Erythromycin Rash    Hives  Other reaction(s): Other (See Comments), Other (See Comments) Hives  Hives  Hives     Ranitidine Rash    Hives  Other reaction(s): Other (  See Comments), Other (See Comments) Hives  Hives  Hives      Family History  Problem Relation Age of Onset   Lung cancer Mother        Never smoked   Lung cancer Father    Stroke Sister    Lung cancer Brother    Kidney disease Brother    Heart attack Neg Hx     Prior to Admission medications   Medication Sig Start Date End Date Taking? Authorizing Provider  acetaminophen (TYLENOL) 325 MG tablet Take 650 mg by mouth every 6 (six) hours as needed for mild pain or moderate pain.    [provider]  albuterol (PROVENTIL) (2.5 MG/3ML) 0.083% nebulizer solution Take 2.5 mg by nebulization every 4 (four) hours as needed for wheezing or shortness of breath.    [provider]  albuterol (VENTOLIN HFA) 108 (90 Base) MCG/ACT inhaler Inhale 2 puffs into the lungs  every 4 (four) hours as needed for wheezing or shortness of breath.    [provider]  ALVESCO 160 MCG/ACT inhaler Inhale 2 puffs into the lungs 2 (two) times daily. 09/07/21   [provider]  apixaban (ELIQUIS) 2.5 MG TABS tablet Take 1 tablet (2.5 mg total) by mouth 2 (two) times daily. 07/31/20   [name], MD  carboxymethylcellulose (REFRESH PLUS) 0.5 % SOLN Place 1 drop into both eyes as needed (for dry eyes). 08/04/19   [provider]  cholecalciferol (VITAMIN D) 25 MCG (1000 UNIT) tablet Take 1,000 Units by mouth See admin instructions. 1 by mouth two times a week on Saturday and Sunday for supplement.    [provider]  diclofenac Sodium (VOLTAREN) 1 % GEL Apply 2 g topically 4 (four) times daily as needed (shoulder pain).    [provider]  docusate sodium (COLACE) 100 MG capsule Take 1 capsule (100 mg total) by mouth 2 (two) times daily as needed for mild constipation. 09/28/21   Thurnell Lose, MD  fluticasone (FLONASE) 50 MCG/ACT nasal spray Place 2 sprays into both nostrils daily.    [provider]  furosemide (LASIX) 40 MG tablet Take 1 tablet (40 mg total) by mouth daily. 10/18/21   Domenic Polite, MD  gabapentin (NEURONTIN) 300 MG capsule Take 300 mg by mouth 3 (three) times daily.    [provider]  guaiFENesin (MUCINEX) 600 MG 12 hr tablet Take 600 mg by mouth 2 (two) times daily.    [provider]  latanoprost (XALATAN) 0.005 % ophthalmic solution Place 1 drop into both eyes at bedtime.    [provider]  loratadine (CLARITIN) 10 MG tablet Take 10 mg by mouth daily.    [provider]  Metoprolol Tartrate 37.5 MG TABS Take 1 tablet by mouth 2 (two) times daily. 09/06/21   [provider]  omeprazole (PRILOSEC) 20 MG capsule Take 20 mg by mouth daily.    [provider]  Oyster Shell 500 MG TABS Take 500 mg by mouth 2 (two) times daily.    [provider]   polyethylene glycol (MIRALAX / GLYCOLAX) 17 g packet Take 17 g by mouth daily as needed for moderate constipation. 09/28/21   Thurnell Lose, MD  PSYLLIUM HUSK PO Take 1 capsule by mouth as needed (for constipation).    [provider]  sodium chloride (OCEAN) 0.65 % SOLN nasal spray Place 2 sprays into both nostrils 2 (two) times daily.    [provider]  spironolactone (ALDACTONE)  25 MG tablet Take 0.5 tablets (12.5 mg total) by mouth daily. 10/18/21 10/18/22  Domenic Polite, MD  STIOLTO RESPIMAT 2.5-2.5 MCG/ACT AERS Inhale 2 puffs into the lungs daily. 09/06/21   [provider]    Physical Exam: Vitals:   11/09/21 1930 11/09/21 2000 11/09/21 2020 11/09/21 2123  BP: 98/74 113/88 (!) 98/59 104/76  Pulse:  62 (!) 48   Resp: 19 (!) 25 (!) 26 20  Temp:      TempSrc:      SpO2:  98% 97%   Weight:      Height:       Constitutional: NAD, calm, comfortable Eyes: PERRL, lids and conjunctivae normal ENMT: Mucous membranes are moist. Posterior pharynx clear of any exudate or lesions.Normal dentition.  Neck: normal, supple, no masses, no thyromegaly Respiratory: Rhonchi bilaterally Cardiovascular: irregularly irregular rhythm Abdomen: no tenderness, no masses palpated. No hepatosplenomegaly. Bowel sounds positive.  Musculoskeletal: no clubbing / cyanosis. No joint deformity upper and lower extremities. Good ROM, no contractures. Normal muscle tone.  Skin: no rashes, lesions, ulcers. No induration Neurologic: No appreciable focal neurologic deficits Psychiatric: Normal judgment and insight. Alert and oriented x 3. Normal mood.   Data Reviewed:        Latest Ref Rng & Units 11/09/2021    7:13 PM 10/18/2021   12:16 AM 10/17/2021   12:10 AM  CMP  Glucose 70 - 99 mg/dL 152  136  150   BUN 8 - 23 mg/dL 59  22  21   Creatinine 0.61 - 1.24 mg/dL 2.25  1.57  1.65   Sodium 135 - 145 mmol/L 134  137  138   Potassium 3.5 - 5.1 mmol/L 5.2  3.7  3.7   Chloride 98 -  111 mmol/L 97  98  99   CO2 22 - 32 mmol/L 25  30  30    Calcium 8.9 - 10.3 mg/dL 10.1  9.5  9.5   Total Protein 6.5 - 8.1 g/dL 7.3     Total Bilirubin 0.3 - 1.2 mg/dL 0.7     Alkaline Phos 38 - 126 U/L 100     AST 15 - 41 U/L 16     ALT 0 - 44 U/L 12      CT head = IMPRESSION: 1. No acute intracranial abnormality. 2. Chronic left posterior parietal infarct. 3. Generalized cerebral atrophy and microvascular disease changes of the supratentorial brain.  CXR = IMPRESSION: No change in appearance of the chest since previous study. Cardiac enlargement. Diffuse interstitial pattern to the lungs with linear fibrosis or atelectasis in the lung bases.  Assessment and Plan: * AKI (acute kidney injury) ([place]) Suspect hypotension / pre-renal due to over diuresis from recent CHF admissions and lasix + aldactone use. Hypotension resolved in ED following ~750cc total IVF bolus volume. Hold lasix + aldactone Got 750cc IVF in ED, hypotension resolved. Strict intake and output Repeat BMP in AM  Stage 3a chronic kidney disease (CKD) ([place]) Now with superimposed AKI.  CVD (cerebrovascular disease) Pt with recent stroke. Now having generalized weakness with hypotension. Some question of L>R sided weakness for past 3 days. Will get MRI brain to r/o evidence of new acute stroke.  ILD (interstitial lung disease) ([place]) Recent CXRs suggestive of a chronic ILD.  Possibly pulmonary fibrosis.  Also would explain his moderate PAH seen on recent 2d echo. Probably would be a good idea to at least follow up with pulm following hospital stay.  Chronic HFrEF (heart failure  with reduced ejection fraction) (HCC) Holding diuretics due to AKI.  Atrial fibrillation (HCC) Cont eliquis and metoprolol  COPD (chronic obstructive pulmonary disease) ([place]) Continue home nebs.  Hypertension Hold home lasix and aldactone given initial hypotension in ED.  Type 2 diabetes mellitus ([place]) Sensitive SSI  AC  Hyperlipidemia Allergy to statins  OSA (obstructive sleep apnea) CPAP QHS      Advance Care Planning:   Code Status: DNR  Consults: None  Family Communication: Family at bedside  Severity of Illness: The appropriate patient status for this patient is OBSERVATION. Observation status is judged to be reasonable and necessary in order to provide the required intensity of service to ensure the patient's safety. The patient's presenting symptoms, physical exam findings, and initial radiographic and laboratory data in the context of their medical condition is felt to place them at decreased risk for further clinical deterioration. Furthermore, it is anticipated that the patient will be medically stable for discharge from the hospital within 2 midnights of admission.   Author: Etta Quill., DO 11/09/2021 10:12 PM  For on call review [email or web address].

## 2021-11-09 NOTE — Assessment & Plan Note (Signed)
Cont eliquis and metoprolol

## 2021-11-09 NOTE — Assessment & Plan Note (Signed)
Allergy to statins

## 2021-11-09 NOTE — Assessment & Plan Note (Signed)
Holding diuretics due to AKI.

## 2021-11-09 NOTE — Assessment & Plan Note (Signed)
Pt with recent stroke. Now having generalized weakness with hypotension. Some question of L>R sided weakness for past 3 days. 1. Will get MRI brain to r/o evidence of new acute stroke.

## 2021-11-09 NOTE — Assessment & Plan Note (Signed)
Recent CXRs suggestive of a chronic ILD.  Possibly pulmonary fibrosis.  Also would explain his moderate PAH seen on recent 2d echo. 1. Probably would be a good idea to at least follow up with pulm following hospital stay.

## 2021-11-10 ENCOUNTER — Observation Stay (HOSPITAL_COMMUNITY): Payer: No Typology Code available for payment source

## 2021-11-10 DIAGNOSIS — E860 Dehydration: Secondary | ICD-10-CM | POA: Diagnosis present

## 2021-11-10 DIAGNOSIS — I5022 Chronic systolic (congestive) heart failure: Secondary | ICD-10-CM | POA: Diagnosis present

## 2021-11-10 DIAGNOSIS — E7800 Pure hypercholesterolemia, unspecified: Secondary | ICD-10-CM | POA: Diagnosis present

## 2021-11-10 DIAGNOSIS — G4733 Obstructive sleep apnea (adult) (pediatric): Secondary | ICD-10-CM | POA: Diagnosis present

## 2021-11-10 DIAGNOSIS — M549 Dorsalgia, unspecified: Secondary | ICD-10-CM | POA: Diagnosis present

## 2021-11-10 DIAGNOSIS — Z8673 Personal history of transient ischemic attack (TIA), and cerebral infarction without residual deficits: Secondary | ICD-10-CM | POA: Diagnosis not present

## 2021-11-10 DIAGNOSIS — I480 Paroxysmal atrial fibrillation: Secondary | ICD-10-CM | POA: Diagnosis not present

## 2021-11-10 DIAGNOSIS — J449 Chronic obstructive pulmonary disease, unspecified: Secondary | ICD-10-CM | POA: Diagnosis present

## 2021-11-10 DIAGNOSIS — I4891 Unspecified atrial fibrillation: Secondary | ICD-10-CM | POA: Diagnosis present

## 2021-11-10 DIAGNOSIS — Z823 Family history of stroke: Secondary | ICD-10-CM | POA: Diagnosis not present

## 2021-11-10 DIAGNOSIS — Z8616 Personal history of COVID-19: Secondary | ICD-10-CM | POA: Diagnosis not present

## 2021-11-10 DIAGNOSIS — N1831 Chronic kidney disease, stage 3a: Secondary | ICD-10-CM | POA: Diagnosis present

## 2021-11-10 DIAGNOSIS — Z96651 Presence of right artificial knee joint: Secondary | ICD-10-CM | POA: Diagnosis present

## 2021-11-10 DIAGNOSIS — I130 Hypertensive heart and chronic kidney disease with heart failure and stage 1 through stage 4 chronic kidney disease, or unspecified chronic kidney disease: Secondary | ICD-10-CM | POA: Diagnosis present

## 2021-11-10 DIAGNOSIS — M6281 Muscle weakness (generalized): Secondary | ICD-10-CM | POA: Diagnosis present

## 2021-11-10 DIAGNOSIS — Z841 Family history of disorders of kidney and ureter: Secondary | ICD-10-CM | POA: Diagnosis not present

## 2021-11-10 DIAGNOSIS — Z888 Allergy status to other drugs, medicaments and biological substances status: Secondary | ICD-10-CM | POA: Diagnosis not present

## 2021-11-10 DIAGNOSIS — N179 Acute kidney failure, unspecified: Secondary | ICD-10-CM | POA: Diagnosis present

## 2021-11-10 DIAGNOSIS — E1122 Type 2 diabetes mellitus with diabetic chronic kidney disease: Secondary | ICD-10-CM | POA: Diagnosis present

## 2021-11-10 DIAGNOSIS — Z66 Do not resuscitate: Secondary | ICD-10-CM | POA: Diagnosis present

## 2021-11-10 DIAGNOSIS — Z87891 Personal history of nicotine dependence: Secondary | ICD-10-CM | POA: Diagnosis not present

## 2021-11-10 DIAGNOSIS — Z96642 Presence of left artificial hip joint: Secondary | ICD-10-CM | POA: Diagnosis present

## 2021-11-10 DIAGNOSIS — J849 Interstitial pulmonary disease, unspecified: Secondary | ICD-10-CM | POA: Diagnosis present

## 2021-11-10 DIAGNOSIS — I959 Hypotension, unspecified: Secondary | ICD-10-CM | POA: Diagnosis present

## 2021-11-10 DIAGNOSIS — Z9049 Acquired absence of other specified parts of digestive tract: Secondary | ICD-10-CM | POA: Diagnosis not present

## 2021-11-10 LAB — URINALYSIS, ROUTINE W REFLEX MICROSCOPIC
Bilirubin Urine: NEGATIVE
Glucose, UA: NEGATIVE mg/dL
Hgb urine dipstick: NEGATIVE
Ketones, ur: NEGATIVE mg/dL
Leukocytes,Ua: NEGATIVE
Nitrite: NEGATIVE
Protein, ur: NEGATIVE mg/dL
Specific Gravity, Urine: 1.009 (ref 1.005–1.030)
pH: 5 (ref 5.0–8.0)

## 2021-11-10 LAB — LACTIC ACID, PLASMA: Lactic Acid, Venous: 2.3 mmol/L (ref 0.5–1.9)

## 2021-11-10 LAB — BASIC METABOLIC PANEL
Anion gap: 13 (ref 5–15)
BUN: 53 mg/dL — ABNORMAL HIGH (ref 8–23)
CO2: 24 mmol/L (ref 22–32)
Calcium: 10.1 mg/dL (ref 8.9–10.3)
Chloride: 99 mmol/L (ref 98–111)
Creatinine, Ser: 1.84 mg/dL — ABNORMAL HIGH (ref 0.61–1.24)
GFR, Estimated: 34 mL/min — ABNORMAL LOW (ref >60)
Glucose, Bld: 170 mg/dL — ABNORMAL HIGH (ref 70–99)
Potassium: 4.7 mmol/L (ref 3.5–5.1)
Sodium: 136 mmol/L (ref 135–145)

## 2021-11-10 LAB — GLUCOSE, CAPILLARY
Glucose-Capillary: 175 mg/dL — ABNORMAL HIGH (ref 70–99)
Glucose-Capillary: 148 mg/dL — ABNORMAL HIGH (ref 70–99)

## 2021-11-10 LAB — HEMOGLOBIN A1C
Hgb A1c MFr Bld: 7 % — ABNORMAL HIGH (ref 4.8–5.6)
Mean Plasma Glucose: 154.2 mg/dL

## 2021-11-10 LAB — CBG MONITORING, ED
Glucose-Capillary: 173 mg/dL — ABNORMAL HIGH (ref 70–99)
Glucose-Capillary: 132 mg/dL — ABNORMAL HIGH (ref 70–99)

## 2021-11-10 LAB — TROPONIN I (HIGH SENSITIVITY): Troponin I (High Sensitivity): 39 ng/L — ABNORMAL HIGH (ref <18)

## 2021-11-10 MED ORDER — LORATADINE 10 MG PO TABS
10.0000 mg | ORAL_TABLET | Freq: Every day | ORAL | Status: DC
  Administered 2021-11-10 – 2021-11-11 (×2): 10 mg via ORAL
  Filled 2021-11-10 (×2): qty 1

## 2021-11-10 MED ORDER — POLYVINYL ALCOHOL 1.4 % OP SOLN: 1.0000 [drp] | OPHTHALMIC | Status: DC | PRN

## 2021-11-10 MED ORDER — PANTOPRAZOLE SODIUM 40 MG PO TBEC
40.0000 mg | DELAYED_RELEASE_TABLET | Freq: Every day | ORAL | Status: DC
  Administered 2021-11-10 – 2021-11-11 (×2): 40 mg via ORAL
  Filled 2021-11-10 (×2): qty 1

## 2021-11-10 MED ORDER — GABAPENTIN 300 MG PO CAPS
300.0000 mg | ORAL_CAPSULE | Freq: Three times a day (TID) | ORAL | Status: DC
  Administered 2021-11-10 – 2021-11-11 (×3): 300 mg via ORAL
  Filled 2021-11-10 (×3): qty 1

## 2021-11-10 MED ORDER — ACETAMINOPHEN 325 MG PO TABS
650.0000 mg | ORAL_TABLET | Freq: Four times a day (QID) | ORAL | Status: DC | PRN
  Administered 2021-11-10: 650 mg via ORAL

## 2021-11-10 MED ORDER — DOCUSATE SODIUM 100 MG PO CAPS: 100.0000 mg | ORAL_CAPSULE | Freq: Two times a day (BID) | ORAL | Status: DC | PRN

## 2021-11-10 MED ORDER — FLUTICASONE PROPIONATE HFA 110 MCG/ACT IN AERO: 1.0000 | INHALATION_SPRAY | Freq: Two times a day (BID) | RESPIRATORY_TRACT | Status: DC

## 2021-11-10 MED ORDER — POLYETHYLENE GLYCOL 3350 17 G PO PACK: 17.0000 g | PACK | Freq: Every day | ORAL | Status: DC | PRN

## 2021-11-10 MED ORDER — SODIUM CHLORIDE 0.9 % IV SOLN: INTRAVENOUS | Status: AC

## 2021-11-10 MED ORDER — ALBUTEROL SULFATE (2.5 MG/3ML) 0.083% IN NEBU: 2.5000 mg | INHALATION_SOLUTION | RESPIRATORY_TRACT | Status: DC | PRN

## 2021-11-10 MED ORDER — FLUTICASONE PROPIONATE 50 MCG/ACT NA SUSP
2.0000 | Freq: Every day | NASAL | Status: DC
  Filled 2021-11-10: qty 16

## 2021-11-10 MED ORDER — VITAMIN D 25 MCG (1000 UNIT) PO TABS: 1000.0000 [IU] | ORAL_TABLET | ORAL | Status: DC

## 2021-11-10 MED ORDER — DICLOFENAC SODIUM 1 % EX GEL: 2.0000 g | Freq: Four times a day (QID) | CUTANEOUS | Status: DC | PRN

## 2021-11-10 MED ORDER — BUDESONIDE 0.25 MG/2ML IN SUSP
0.2500 mg | Freq: Two times a day (BID) | RESPIRATORY_TRACT | Status: DC
  Administered 2021-11-10 – 2021-11-11 (×2): 0.25 mg via RESPIRATORY_TRACT
  Filled 2021-11-10 (×2): qty 2

## 2021-11-10 MED ORDER — LATANOPROST 0.005 % OP SOLN
1.0000 [drp] | Freq: Every day | OPHTHALMIC | Status: DC
  Administered 2021-11-10: 1 [drp] via OPHTHALMIC
  Filled 2021-11-10 (×2): qty 2.5

## 2021-11-10 MED ORDER — GUAIFENESIN ER 600 MG PO TB12
600.0000 mg | ORAL_TABLET | Freq: Two times a day (BID) | ORAL | Status: DC
  Administered 2021-11-10 – 2021-11-11 (×3): 600 mg via ORAL
  Filled 2021-11-10 (×3): qty 1

## 2021-11-10 MED ORDER — CICLESONIDE 160 MCG/ACT IN AERS: 1.0000 | INHALATION_SPRAY | Freq: Two times a day (BID) | RESPIRATORY_TRACT | Status: DC

## 2021-11-10 MED ORDER — ALBUTEROL SULFATE HFA 108 (90 BASE) MCG/ACT IN AERS: 2.0000 | INHALATION_SPRAY | RESPIRATORY_TRACT | Status: DC | PRN

## 2021-11-10 NOTE — Progress Notes (Signed)
PT Cancellation Note  Patient Details Name: Jordan Hoffman MRN: [id number] DOB: [birth year]   Cancelled Treatment:    Reason Eval/Treat Not Completed: Patient declined, no reason specified.  Reports he is just not feeling up to moving yet, but will try at another time.  Follow up as time and pt allow.   Ramond Dial 11/10/2021, 11:53 AM  Mee Hives, PT PhD Acute Rehab Dept. Number: [place] [facility] [place]

## 2021-11-10 NOTE — Progress Notes (Signed)
NEW ADMISSION NOTE New Admission Note:   Arrival Method: ED stretcher Mental Orientation: AAox4 Telemetry: 5M18 Assessment: Completed Skin: Intact IV: LAC Pain: 0/10 Tubes:n/a Safety Measures: Safety Fall Prevention Plan has been given, discussed and signed Admission: Completed [facility] Orientation: Patient has been orientated to the room, unit and staff.  Family: wife and children at bedside  Orders have been reviewed and implemented. Will continue to monitor the patient. Call light has been placed within reach and bed alarm has been activated.   Vira Agar, RN

## 2021-11-10 NOTE — ED Notes (Signed)
Pt placed on 2L [place] - desatting in sleep - pt wears cpap at home - MD Alcario Drought notified  Pt taken to MRI

## 2021-11-10 NOTE — Progress Notes (Signed)
Patient states that he wears nasal prongs at home with CPAP.  RT advised that hospital has nasal mask and full face mask.  Patient states he will hold off wearing CPAP tonight. RT will continue to monitor.

## 2021-11-10 NOTE — Progress Notes (Addendum)
PROGRESS NOTE    [name]  HYQ:[id number] DOB: 01-Dec-1928 DOA: 11/09/2021 [facility]CP: [facility]    Brief Narrative:  Jordan Hoffman is a 88 years old male with past medical history of COPD, type 2 diabetes, hypertension, CKD stage IIIa, atrial fibrillation on metoprolol and Eliquis with recent history of stroke in August after cholecystectomy, recent CHF admission in September presented to the hospital with weakness for 3 days with difficulty coordinating, fatigue and tiredness.  In the ED initial vitals were notable for hypotension with blood pressure of 77/58.  CBC was within normal limits but BMP showed creatinine elevated at 2.2 potassium at 5.2 and sodium at 134.  Urinalysis was negative.  Lactic acid was 1.94 followed by 2.3.  Blood cultures were sent from the ED and was patient was admitted hospital for further evaluation and treatment..  Assessment and Plan:  Principal Problem:   AKI (acute kidney injury) ([place]) Active Problems:   Stage 3a chronic kidney disease (CKD) (HCC)   CVD (cerebrovascular disease)   ILD (interstitial lung disease) ([place])   Atrial fibrillation (HCC)   Chronic HFrEF (heart failure with reduced ejection fraction) (HCC)   COPD (chronic obstructive pulmonary disease) (HCC)   Hypertension   Type 2 diabetes mellitus (HCC)   Hyperlipidemia   OSA (obstructive sleep apnea)  AKI (acute kidney injury on CKD stage IIIa. Likely secondary to hypotension and overdiuresis from recent CHF admission with use of Lasix and Aldactone.  Patient received 750 mls of IV fluids in the ED with improvement in blood pressure now trending down again, Continue IV fluids for 1 more day..  Continue to hold Lasix and Aldactone.  Continue strict intake and output charting.  Repeat BMP in AM.  Creatinine today at 1.8 slightly improved from 2.2..  We will continue to monitor.  .  Creatinine at baseline around 1.0, 1 month back..    History of CVD (cerebrovascular disease) Presenting  with generalized weakness and hypotension.  There was some question of increased left-sided weakness for last 3 days so repeat MRI of the brain was performed with no acute intracranial abnormality and expected evolution of posterior left MCA territory infarct in August. Continue physical therapy, occupational evaluation.  Continue hydration.   ILD (interstitial lung disease) ([place]) Chest x-ray showing possible ILD.  Mild PAH in 2D echocardiogram.  Was on 2 L of oxygen by nasal cannula currently has been weaned to room air.  Chronic HFrEF (heart failure with reduced ejection fraction) (HCC) Continue to hold diuretics due to AKI.  Patient was started on Entresto last week we will continue to hold including spironolactone.  Patient is negative balance for 600 mL at this time.   Atrial fibrillation (HCC) Cont eliquis and metoprolol.  Rate controlled.   COPD (chronic obstructive pulmonary disease) (HCC) Continue home nebs.   Hypertension Continue to hold Lasix and Aldactone for now.  Blood pressure still borderline low at 99/57.  We will continue normal saline for 1 more day.   Type 2 diabetes mellitus (HCC) Continue sliding scale insulin Accu-Cheks diabetic diet.   Hyperlipidemia Not on statins due to allergy   OSA (obstructive sleep apnea) Continue CPAP at nighttime.  Generalized weakness deconditioning.  We will get PT OT evaluation.     DVT prophylaxis: apixaban (ELIQUIS) tablet 2.5 mg Start: 11/09/21 2345 apixaban (ELIQUIS) tablet 2.5 mg   Code Status:     Code Status: DNR  Disposition: Home Status is: Observation The patient will require care spanning >  2 midnights and should be moved to inpatient because: Hypotension, volume depletion, acute kidney injury, IV fluids, weakness, PT OT evaluation.   Family Communication: None at bedside  Consultants:  None  Procedures:  None  Antimicrobials:  None  Anti-infectives (From admission, onward)    None       Subjective: Today, patient was seen and examined at bedside.  Patient complains of generalized weakness and mild back pain.  Denies any chest pain, shortness of breath, fever or chills.  Objective: Vitals:   11/10/21 0555 11/10/21 0817 11/10/21 1000 11/10/21 1049  BP:    (!) 99/57  Pulse: 75  78 82  Resp: 17  19 17   Temp:  97.9 F (36.6 C)    TempSrc:  Oral    SpO2: 95%  99% 94%  Weight:      Height:        Intake/Output Summary (Last 24 hours) at 11/10/2021 1116 Last data filed at 11/10/2021 0358 Gross per 24 hour  Intake --  Output 600 ml  Net -600 ml   Filed Weights   11/09/21 1900  Weight: 78.5 kg    Physical Examination: Body mass index is 21.06 kg/m.  General:  Average built, not in obvious distress HENT:   No scleral pallor or icterus noted. Oral mucosa is moist.  Chest:   Diminished breath sounds bilaterally  CVS: S1 &S2 heard. No murmur.  Irregular rhythm. Abdomen: Soft, nontender, nondistended.  Bowel sounds are heard.   Extremities: No cyanosis, clubbing or edema.  Peripheral pulses are palpable. Psych: Alert, awake and oriented, normal mood CNS:  No cranial nerve deficits.  Power equal in all extremities.   Skin: Warm and dry.  No rashes noted.  Data Reviewed:   CBC: Recent Labs  Lab 11/09/21 1913  WBC 5.4  NEUTROABS 2.9  HGB 14.0  HCT 45.2  MCV 89.3  PLT 076    Basic Metabolic Panel: Recent Labs  Lab 11/09/21 1913 11/10/21 0424  NA 134* 136  K 5.2* 4.7  CL 97* 99  CO2 25 24  GLUCOSE 152* 170*  BUN 59* 53*  CREATININE 2.25* 1.84*  CALCIUM 10.1 10.1    Liver Function Tests: Recent Labs  Lab 11/09/21 1913  AST 16  ALT 12  ALKPHOS 100  BILITOT 0.7  PROT 7.3  ALBUMIN 3.4*     Radiology Studies: MR BRAIN WO CONTRAST  Result Date: 11/10/2021 CLINICAL DATA:  88 year old male with weakness. EXAM: MRI HEAD WITHOUT CONTRAST TECHNIQUE: Multiplanar, multiecho pulse sequences of the brain and surrounding structures were  obtained without intravenous contrast. COMPARISON:  Head CT 11/09/2021. [facility] Brain MRI 08/29/2021 FINDINGS: Brain: Expected evolution of posterior left MCA territory infarcts since August with developing perirolandic encephalomalacia (series 11, image 19). Largely resolved diffusion abnormality there. Faint associated petechial hemorrhage. No new diffusion restriction. No midline shift, mass effect, evidence of mass lesion, ventriculomegaly, extra-axial collection or acute intracranial hemorrhage. Cervicomedullary junction and pituitary are within normal limits. Patchy and scattered cerebral white matter T2 and FLAIR hyperintensity outside of the posterior left MCA territory appears stable. Chronic microhemorrhage in the posterior right frontal lobe cortex or subcortical white matter is stable on series 14, image 41. Mild for age T2 heterogeneity in the bilateral basal ganglia is stable. Thalami, brainstem, and cerebellum remain normal for age. Vascular: Major intracranial vascular flow voids are stable since August. Skull and upper cervical spine: Bulky severe degenerative ligamentous hypertrophy about the odontoid  process which appears partially eroded at its ventral base, stable from August. Normal underlying bone marrow signal. Mild chronic stenosis at the pituitary C1 cervicomedullary junction. Sinuses/Orbits: Chronic left maxillary sinusitis. Other paranasal sinuses are well aerated. Postoperative changes to the orbits are stable. Other: Mastoids remain clear. Grossly normal visible internal auditory structures. Negative visible scalp and face. IMPRESSION: 1. No acute intracranial abnormality identified. 2. Expected evolution of a posterior Left MCA territory infarct in August. Underlying mild for age chronic small vessel disease. 3. Chronic severe ligamentous hypertrophy about the odontoid with mild chronic stenosis at the cervicomedullary junction. 4. Chronic  left maxillary sinusitis. Electronically Signed   By: Genevie Ann M.D.   On: 11/10/2021 06:38   CT Head Wo Contrast  Result Date: 11/09/2021 CLINICAL DATA:  Weakness x3 days. EXAM: CT HEAD WITHOUT CONTRAST TECHNIQUE: Contiguous axial images were obtained from the base of the skull through the vertex without intravenous contrast. RADIATION DOSE REDUCTION: This exam was performed according to the departmental dose-optimization program which includes automated exposure control, adjustment of the mA and/or kV according to patient size and/or use of iterative reconstruction technique. COMPARISON:  September 25, 2021 FINDINGS: Brain: There is mild to moderate severity cerebral atrophy with widening of the extra-axial spaces and ventricular dilatation. There are areas of decreased attenuation within the white matter tracts of the supratentorial brain, consistent with microvascular disease changes. A chronic left posterior parietal infarct is seen. Vascular: There is moderate severity calcification of the bilateral cavernous carotid arteries. Skull: Normal. Negative for fracture or focal lesion. Sinuses/Orbits: There is marked severity left maxillary sinus and mild bilateral ethmoid sinus mucosal thickening. Marked severity chronic thickening of the walls of the left maxillary sinus is also seen. Other: None. IMPRESSION: 1. No acute intracranial abnormality. 2. Chronic left posterior parietal infarct. 3. Generalized cerebral atrophy and microvascular disease changes of the supratentorial brain. Electronically Signed   By: Virgina Norfolk M.D.   On: 11/09/2021 21:18   DG Chest Portable 1 View  Result Date: 11/09/2021 CLINICAL DATA:  Weakness for 3 days. EXAM: PORTABLE CHEST 1 VIEW COMPARISON:  10/17/2021 FINDINGS: Shallow inspiration. Cardiac enlargement. Interstitial changes in the lungs, similar to prior study, possibly chronic. Linear atelectasis or scarring in the lung bases. No focal consolidation. No pleural  effusions. No pneumothorax. Mediastinal contours appear intact. Calcified and tortuous aorta. Degenerative changes in the spine and shoulders. IMPRESSION: No change in appearance of the chest since previous study. Cardiac enlargement. Diffuse interstitial pattern to the lungs with linear fibrosis or atelectasis in the lung bases. Electronically Signed   By: Lucienne Capers M.D.   On: 11/09/2021 20:20      LOS: 0 days    Flora Lipps, MD [facility] Available via Epic secure chat 7am-7pm After these hours, please refer to coverage provider listed on [email or web address] 11/10/2021, 11:16 AM

## 2021-11-10 NOTE — Progress Notes (Signed)
Patient complaining of a new onset headache 6/10 with sharp pains on the right side of his head.  Administered tylenol, made MD aware.  Aurther Loft, RN

## 2021-11-10 NOTE — ED Notes (Signed)
MD Alcario Drought notified of critical lactic

## 2021-11-10 NOTE — Hospital Course (Addendum)
Jordan Hoffman is a 88 years old male with past medical history of COPD, type 2 diabetes, hypertension, CKD stage IIIa, atrial fibrillation on metoprolol and Eliquis with recent history of stroke in August after cholecystectomy, recent CHF admission in September presented to hospital with weakness for 3 days with difficulty coordinating, fatigue and tiredness.

## 2021-11-10 NOTE — Evaluation (Signed)
Occupational Therapy Evaluation Patient Details Name: Jordan Hoffman MRN: [id number] DOB: [birth year] Today's Date: 11/10/2021   History of Present Illness Pt is a 88 y/o male who presented after increased R sided weakness when working with [facility]. MRI brain negative for acute abnormalities, noted with expected evolution of L MCA from 8/23. Admitted for AKI in setting of over diuresis from recent CHF admissions. 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 PT currently working with HHPT/OT with improving mobility with RW and light assistance needed for ADLs. Pt presents now at most recent baseline without new R sided deficits. Pt able to mobilize with RW at min guard, Min A for UB ADL and Min-Mod A for LB ADLs. Recommend pt to resume [facility] services at [place]. Will continue to follow acutely.     Recommendations for follow up therapy are one component of a multi-disciplinary discharge planning process, led by the attending physician.  Recommendations may be updated based on patient status, additional functional criteria and insurance authorization.   Follow Up Recommendations  Home health OT (resume HHOT)    Assistance Recommended at Discharge Intermittent Supervision/Assistance  Patient can return home with the following A little help with walking and/or transfers;A little help with bathing/dressing/bathroom;Assistance with cooking/housework    Functional Status Assessment  Patient has had a recent decline in their functional status and demonstrates the ability to make significant improvements in function in a reasonable and predictable amount of time.  Equipment Recommendations  None recommended by OT    Recommendations for Other Services       Precautions / Restrictions Precautions Precautions: Fall Restrictions Weight Bearing  Restrictions: No      Mobility Bed Mobility Overal bed mobility: Modified Independent             General bed mobility comments: to sit on side of stretcher    Transfers Overall transfer level: Needs assistance Equipment used: Rolling walker (2 wheels) Transfers: Sit to/from Stand Sit to Stand: Supervision                  Balance Overall balance assessment: Needs assistance Sitting-balance support: No upper extremity supported, Feet supported Sitting balance-Leahy Scale: Fair     Standing balance support: No upper extremity supported, Bilateral upper extremity supported, During functional activity Standing balance-Leahy Scale: Fair                             ADL either performed or assessed with clinical judgement   ADL Overall ADL's : Needs assistance/impaired Eating/Feeding: Set up;Sitting Eating/Feeding Details (indicate cue type and reason): assist to open coffee creamers Grooming: Supervision/safety;Standing   Upper Body Bathing: Minimal assistance;Sitting   Lower Body Bathing: Minimal assistance;Sit to/from stand   Upper Body Dressing : Minimal assistance;Sitting   Lower Body Dressing: Moderate assistance;Sit to/from stand   Toilet Transfer: Min guard;Ambulation;Rolling walker (2 wheels)   Toileting- Clothing Manipulation and Hygiene: Minimal assistance;Sit to/from stand;Sitting/lateral lean       Functional mobility during ADLs: Min guard;Rolling walker (2 wheels) General ADL Comments: Pt appears functioning at most recent baseline with no new R sided deficits. Pt able to mobilize out in hall with RW and without LOB though slow pacing.     Vision Baseline Vision/History: 1 Wears glasses  Ability to See in Adequate Light: 0 Adequate Patient Visual Report: No change from baseline Vision Assessment?: No apparent visual deficits     Perception     Praxis      Pertinent Vitals/Pain Pain Assessment Pain Assessment: No/denies  pain     Hand Dominance Left   Extremity/Trunk Assessment Upper Extremity Assessment Upper Extremity Assessment: RUE deficits/detail;LUE deficits/detail RUE Deficits / Details: strength overall symmetrical and WFL w/ hand, biceps and triceps. B impaired shoulder flexion to approx 70*. Pt reports hx of frozen shoulder issues   Lower Extremity Assessment Lower Extremity Assessment: Defer to PT evaluation   Cervical / Trunk Assessment Cervical / Trunk Assessment: Normal   Communication Communication Communication: No difficulties   Cognition Arousal/Alertness: Awake/alert Behavior During Therapy: WFL for tasks assessed/performed Overall Cognitive Status: Impaired/Different from baseline Area of Impairment: Awareness, Problem solving                           Awareness: Emergent Problem Solving: Slow processing, Requires verbal cues General Comments: very pleasant, follows directions consistently, some slower response time but overall [facility]     General Comments       Exercises     Shoulder Instructions      Home Living Family/patient expects to be discharged to:: Private residence Living Arrangements: Spouse/significant other Available Help at Discharge: Family;Available 24 hours/day Type of Home: House Home Access: Level entry;Stairs to enter Entrance Stairs-Number of Steps: 2 steps through front door, 1 step through back door   Home Layout: One level;Other (Comment) (2 steps from kitchen to [employer])     [employer] Shower/Tub: Hospital doctor Toilet: Handicapped height Bathroom Accessibility: Yes   Home Equipment: [place] - single Barista (2 wheels);Rollator (4 wheels);Grab bars - tub/shower;Shower seat          Prior Functioning/Environment Prior Level of Function : Needs assist             Mobility Comments: using RW since CVA and working with HHPT ADLs Comments: Was independent with ADLs and light IADLs prior to recent series  of hosptial admissions. Wife assisting some with ADLs now and pt working with [facility]        OT Problem List: Decreased strength;Decreased activity tolerance;Impaired balance (sitting and/or standing)      OT Treatment/Interventions: Self-care/ADL training;Therapeutic exercise;Energy conservation;DME and/or AE instruction;Therapeutic activities;Patient/family education    OT Goals(Current goals can be found in the care plan section) Acute Rehab OT Goals Patient Stated Goal: return to independence, prevent further hospitalizations OT Goal Formulation: With patient Time For Goal Achievement: 11/24/21 Potential to Achieve Goals: Good ADL Goals Pt Will Perform Lower Body Bathing: with modified independence;sit to/from stand Pt Will Perform Lower Body Dressing: with modified independence;sitting/lateral leans;sit to/from stand Pt Will Transfer to Toilet: with modified independence;ambulating Additional ADL Goal #1: Pt to increase standing activity tolerance > 7 min during ADL/mobility  OT Frequency: Min 2X/week    Co-evaluation              AM-PAC OT "6 Clicks" Daily Activity     Outcome Measure Help from another person eating meals?: A Little Help from another person taking care of personal grooming?: A Little Help from another person toileting, which includes using toliet, bedpan, or urinal?: A Little Help from another person bathing (including washing, rinsing, drying)?: A Little Help from another person to put on and taking off regular upper body clothing?: A Little Help from  another person to put on and taking off regular lower body clothing?: A Lot 6 Click Score: 17   End of Session Equipment Utilized During Treatment: Rolling walker (2 wheels);Gait belt Nurse Communication: Mobility status  Activity Tolerance: Patient tolerated treatment well Patient left: in bed;with call bell/phone within reach;Other (comment) (sitting EOB w/ breakfast tray)  OT Visit Diagnosis:  Unsteadiness on feet (R26.81);Muscle weakness (generalized) (M62.81)                Time: 9234-1443 OT Time Calculation (min): 20 min Charges:  OT General Charges $OT Visit: 1 Visit OT Evaluation $OT Eval Low Complexity: 1 Low  Malachy Chamber, OTR/L Acute Rehab Services Office: [phone]   Layla Maw 11/10/2021, 9:48 AM

## 2021-11-11 LAB — CBC
HCT: 42.8 % (ref 39.0–52.0)
Hemoglobin: 13.3 g/dL (ref 13.0–17.0)
MCH: 27.3 pg (ref 26.0–34.0)
MCHC: 31.1 g/dL (ref 30.0–36.0)
MCV: 87.7 fL (ref 80.0–100.0)
Platelets: 230 10*3/uL (ref 150–400)
RBC: 4.88 MIL/uL (ref 4.22–5.81)
RDW: 15.5 % (ref 11.5–15.5)
WBC: 4.3 10*3/uL (ref 4.0–10.5)
nRBC: 0 % (ref 0.0–0.2)

## 2021-11-11 LAB — BASIC METABOLIC PANEL
Anion gap: 7 (ref 5–15)
BUN: 40 mg/dL — ABNORMAL HIGH (ref 8–23)
CO2: 27 mmol/L (ref 22–32)
Calcium: 9.9 mg/dL (ref 8.9–10.3)
Chloride: 105 mmol/L (ref 98–111)
Creatinine, Ser: 1.51 mg/dL — ABNORMAL HIGH (ref 0.61–1.24)
GFR, Estimated: 43 mL/min — ABNORMAL LOW (ref >60)
Glucose, Bld: 140 mg/dL — ABNORMAL HIGH (ref 70–99)
Potassium: 5.2 mmol/L — ABNORMAL HIGH (ref 3.5–5.1)
Sodium: 139 mmol/L (ref 135–145)

## 2021-11-11 LAB — GLUCOSE, CAPILLARY
Glucose-Capillary: 156 mg/dL — ABNORMAL HIGH (ref 70–99)
Glucose-Capillary: 141 mg/dL — ABNORMAL HIGH (ref 70–99)

## 2021-11-11 LAB — MAGNESIUM: Magnesium: 2 mg/dL (ref 1.7–2.4)

## 2021-11-11 MED ORDER — SPIRONOLACTONE 25 MG PO TABS: 12.5000 mg | ORAL_TABLET | Freq: Every day | ORAL | 0 refills | Status: DC

## 2021-11-11 MED ORDER — FUROSEMIDE 40 MG PO TABS: 40.0000 mg | ORAL_TABLET | Freq: Every day | ORAL | 0 refills | Status: DC

## 2021-11-11 NOTE — Discharge Summary (Signed)
Physician Discharge Summary  Jordan Hoffman [id number]:[id number] DOB: 1928/11/07 DOA: 11/09/2021  PCP: [facility]  Admit date: 11/09/2021 Discharge date: 11/11/2021  Admitted From: Home  Discharge disposition: Home with home health   Recommendations for Outpatient Follow-Up:   Follow up with your primary care provider in one week.  Check CBC, BMP, magnesium in 3 to 5 days. Follow-up with your cardiologist in 1 week.  Delene Loll has been discontinued at this time due to hypotension and AKI and hyperkalemia.  Reassess in the next visit.   Discharge Diagnosis:   Principal Problem:   AKI (acute kidney injury) ([place]) Active Problems:   Stage 3a chronic kidney disease (CKD) (HCC)   CVD (cerebrovascular disease)   ILD (interstitial lung disease) (HCC)   Atrial fibrillation (HCC)   Chronic HFrEF (heart failure with reduced ejection fraction) (HCC)   COPD (chronic obstructive pulmonary disease) (HCC)   Hypertension   Type 2 diabetes mellitus (HCC)   Hyperlipidemia   OSA (obstructive sleep apnea)   Discharge Condition: Improved.  Diet recommendation: Low sodium, heart healthy.  Carbohydrate-modified.    Wound care: None.  Code status: Full.   History of Present Illness:   Jordan Hoffman is a 88 years old male with past medical history of COPD, type 2 diabetes, hypertension, CKD stage IIIa, atrial fibrillation on metoprolol and Eliquis with recent history of stroke in August after cholecystectomy, recent CHF admission in September presented to the hospital with weakness for 3 days with difficulty coordinating, fatigue and tiredness.  In the ED, initial vitals were notable for hypotension with blood pressure of 77/58.  CBC was within normal limits but BMP showed creatinine elevated at 2.2 potassium at 5.2 and sodium at 134.  Urinalysis was negative.  Lactic acid was 1.94 followed by 2.3.  Blood cultures were sent from the ED and was patient was admitted hospital for further  evaluation and treatment.  Hospital Course:   Following conditions were addressed during hospitalization as listed below,  AKI (acute kidney injury on CKD stage IIIa. Likely secondary to hypotension and overdiuresis from recent CHF admission with use of Lasix, Aldactone and Entresto..  Patient received IV fluids during hospitalization.  At this time renal function has improved but will continue to hold Lasix and spironolactone for the next 2 days and Entresto until seen by cardiology in the next visit.  Delene Loll was the newest medication on board..  Would recommend checking CBC, BMP in 3 to 5 days.  History of CVD (cerebrovascular disease) Presenting with generalized weakness and hypotension.  There was some question of increased left-sided weakness for last 3 days so repeat MRI of the brain was performed with no acute intracranial abnormality and there was expected evolution of posterior left MCA territory infarct in August. PT, OT has seen the patient and recommended home health on discharge.   ILD (interstitial lung disease) ([place]) Chest x-ray showing possible ILD.  Mild PAH in 2D echocardiogram.  On room air.  Chronic HFrEF (heart failure with reduced ejection fraction) (HCC) We will resume spironolactone and Lasix after couple of days.  Hold/discontinue Entresto until seen by cardiologist.  Patient appears to be compensated at this time.   Atrial fibrillation (HCC) Cont eliquis and metoprolol.  Rate controlled.   COPD (chronic obstructive pulmonary disease) (HCC) Continue home nebs.   Hypertension We will resume beta-blockers on discharge.  Hold Aldactone and Lasix until 11/14/2021.  Delene Loll has been discontinued for now.   Type 2 diabetes mellitus ([place])  Diabetic diet   Hyperlipidemia Not on statins due to allergy   OSA (obstructive sleep apnea) Continue CPAP at nighttime.   Generalized weakness deconditioning.  Physical therapy, OT recommends home health on  discharge.  Disposition.  At this time, patient is stable for disposition.  Spoke with the patient's spouse on the phone and updated her about the clinical condition of the patient, plan for disposition..  Medical Consultants:   None.  Procedures:    None Subjective:   Today, patient was seen and examined at bedside.  Denies any shortness of breath, chest pain, dizziness, lightheadedness feels okay and wishes to go home.  Discharge Exam:   Vitals:   11/11/21 0748 11/11/21 0949  BP:  108/75  Pulse:  80  Resp:    Temp:  (!) 97.3 F (36.3 C)  SpO2: 98% 100%   Vitals:   11/11/21 0002 11/11/21 0601 11/11/21 0748 11/11/21 0949  BP: 108/73 129/76  108/75  Pulse: 72 60  80  Resp: 18 18    Temp: (!) 97.5 F (36.4 C) (!) 97.4 F (36.3 C)  (!) 97.3 F (36.3 C)  TempSrc: Oral Oral  Oral  SpO2: 99% 99% 98% 100%  Weight:      Height:       General: Alert awake, not in obvious distress elderly male, HENT: pupils equally reacting to light,  No scleral pallor or icterus noted. Oral mucosa is moist.  Chest: Diminished breath sounds noted CVS: S1 &S2 heard. No murmur.  Irregular rhythm. Abdomen: Soft, nontender, nondistended.  Bowel sounds are heard.   Extremities: No cyanosis, clubbing or edema.  Peripheral pulses are palpable. Psych: Alert, awake and oriented, normal mood CNS:  No cranial nerve deficits.  Power equal in all extremities.   Skin: Warm and dry.  No rashes noted.  The results of significant diagnostics from this hospitalization (including imaging, microbiology, ancillary and laboratory) are listed below for reference.     Diagnostic Studies:   MR BRAIN WO CONTRAST  Result Date: 11/10/2021 CLINICAL DATA:  88 year old male with weakness. EXAM: MRI HEAD WITHOUT CONTRAST TECHNIQUE: Multiplanar, multiecho pulse sequences of the brain and surrounding structures were obtained without intravenous contrast. COMPARISON:  Head CT 11/09/2021. [facility] Brain MRI 08/29/2021 FINDINGS: Brain: Expected evolution of posterior left MCA territory infarcts since August with developing perirolandic encephalomalacia (series 11, image 19). Largely resolved diffusion abnormality there. Faint associated petechial hemorrhage. No new diffusion restriction. No midline shift, mass effect, evidence of mass lesion, ventriculomegaly, extra-axial collection or acute intracranial hemorrhage. Cervicomedullary junction and pituitary are within normal limits. Patchy and scattered cerebral white matter T2 and FLAIR hyperintensity outside of the posterior left MCA territory appears stable. Chronic microhemorrhage in the posterior right frontal lobe cortex or subcortical white matter is stable on series 14, image 41. Mild for age T2 heterogeneity in the bilateral basal ganglia is stable. Thalami, brainstem, and cerebellum remain normal for age. Vascular: Major intracranial vascular flow voids are stable since August. Skull and upper cervical spine: Bulky severe degenerative ligamentous hypertrophy about the odontoid process which appears partially eroded at its ventral base, stable from August. Normal underlying bone marrow signal. Mild chronic stenosis at the pituitary C1 cervicomedullary junction. Sinuses/Orbits: Chronic left maxillary sinusitis. Other paranasal sinuses are well aerated. Postoperative changes to the orbits are stable. Other: Mastoids remain clear. Grossly normal visible internal auditory structures. Negative visible scalp and face. IMPRESSION: 1. No acute intracranial abnormality identified. 2. Expected  evolution of a posterior Left MCA territory infarct in August. Underlying mild for age chronic small vessel disease. 3. Chronic severe ligamentous hypertrophy about the odontoid with mild chronic stenosis at the cervicomedullary junction. 4. Chronic left maxillary sinusitis. Electronically Signed   By: Genevie Ann M.D.   On: 11/10/2021 06:38   CT Head Wo  Contrast  Result Date: 11/09/2021 CLINICAL DATA:  Weakness x3 days. EXAM: CT HEAD WITHOUT CONTRAST TECHNIQUE: Contiguous axial images were obtained from the base of the skull through the vertex without intravenous contrast. RADIATION DOSE REDUCTION: This exam was performed according to the departmental dose-optimization program which includes automated exposure control, adjustment of the mA and/or kV according to patient size and/or use of iterative reconstruction technique. COMPARISON:  September 25, 2021 FINDINGS: Brain: There is mild to moderate severity cerebral atrophy with widening of the extra-axial spaces and ventricular dilatation. There are areas of decreased attenuation within the white matter tracts of the supratentorial brain, consistent with microvascular disease changes. A chronic left posterior parietal infarct is seen. Vascular: There is moderate severity calcification of the bilateral cavernous carotid arteries. Skull: Normal. Negative for fracture or focal lesion. Sinuses/Orbits: There is marked severity left maxillary sinus and mild bilateral ethmoid sinus mucosal thickening. Marked severity chronic thickening of the walls of the left maxillary sinus is also seen. Other: None. IMPRESSION: 1. No acute intracranial abnormality. 2. Chronic left posterior parietal infarct. 3. Generalized cerebral atrophy and microvascular disease changes of the supratentorial brain. Electronically Signed   By: Virgina Norfolk M.D.   On: 11/09/2021 21:18   DG Chest Portable 1 View  Result Date: 11/09/2021 CLINICAL DATA:  Weakness for 3 days. EXAM: PORTABLE CHEST 1 VIEW COMPARISON:  10/17/2021 FINDINGS: Shallow inspiration. Cardiac enlargement. Interstitial changes in the lungs, similar to prior study, possibly chronic. Linear atelectasis or scarring in the lung bases. No focal consolidation. No pleural effusions. No pneumothorax. Mediastinal contours appear intact. Calcified and tortuous aorta. Degenerative  changes in the spine and shoulders. IMPRESSION: No change in appearance of the chest since previous study. Cardiac enlargement. Diffuse interstitial pattern to the lungs with linear fibrosis or atelectasis in the lung bases. Electronically Signed   By: Lucienne Capers M.D.   On: 11/09/2021 20:20     Labs:   Basic Metabolic Panel: Recent Labs  Lab 11/09/21 1913 11/10/21 0424 11/11/21 0548  NA 134* 136 139  K 5.2* 4.7 5.2*  CL 97* 99 105  CO2 25 24 27   GLUCOSE 152* 170* 140*  BUN 59* 53* 40*  CREATININE 2.25* 1.84* 1.51*  CALCIUM 10.1 10.1 9.9  MG  --   --  2.0   GFR Estimated Creatinine Clearance: 36.7 mL/min (A) (by C-G formula based on SCr of 1.51 mg/dL (H)). Liver Function Tests: Recent Labs  Lab 11/09/21 1913  AST 16  ALT 12  ALKPHOS 100  BILITOT 0.7  PROT 7.3  ALBUMIN 3.4*   No results for input(s): "LIPASE", "AMYLASE" in the last 168 hours. No results for input(s): "AMMONIA" in the last 168 hours. Coagulation profile No results for input(s): "INR", "PROTIME" in the last 168 hours.  CBC: Recent Labs  Lab 11/09/21 1913 11/11/21 0548  WBC 5.4 4.3  NEUTROABS 2.9  --   HGB 14.0 13.3  HCT 45.2 42.8  MCV 89.3 87.7  PLT 266 230   Cardiac Enzymes: No results for input(s): "CKTOTAL", "CKMB", "CKMBINDEX", "TROPONINI" in the last 168 hours. BNP: Invalid input(s): "POCBNP" CBG: Recent Labs  Lab 11/10/21 0925  11/10/21 1310 11/10/21 1700 11/11/21 0740 11/11/21 1132  GLUCAP 132* 175* 148* 156* 141*   D-Dimer No results for input(s): "DDIMER" in the last 72 hours. Hgb A1c Recent Labs    11/10/21 0423  HGBA1C 7.0*   Lipid Profile No results for input(s): "CHOL", "HDL", "LDLCALC", "TRIG", "CHOLHDL", "LDLDIRECT" in the last 72 hours. Thyroid function studies No results for input(s): "TSH", "T4TOTAL", "T3FREE", "THYROIDAB" in the last 72 hours.  Invalid input(s): "FREET3" Anemia work up No results for input(s): "VITAMINB12", "FOLATE", "FERRITIN",  "TIBC", "IRON", "RETICCTPCT" in the last 72 hours. Microbiology Recent Results (from the past 240 hour(s))  Blood culture (routine x 2)     Status: None (Preliminary result)   Collection Time: 11/09/21  8:11 PM   Specimen: BLOOD  Result Value Ref Range Status   Specimen Description BLOOD RIGHT ANTECUBITAL  Final   Special Requests   Final    BOTTLES DRAWN AEROBIC AND ANAEROBIC Blood Culture results may not be optimal due to an excessive volume of blood received in culture bottles   Culture   Final    NO GROWTH 2 DAYS Performed at [facility], [place] [address] [place], [place] [address]    Report Status PENDING  Incomplete  Blood culture (routine x 2)     Status: None (Preliminary result)   Collection Time: 11/09/21  8:38 PM   Specimen: BLOOD RIGHT WRIST  Result Value Ref Range Status   Specimen Description BLOOD RIGHT WRIST  Final   Special Requests   Final    BOTTLES DRAWN AEROBIC AND ANAEROBIC Blood Culture results may not be optimal due to an inadequate volume of blood received in culture bottles   Culture   Final    NO GROWTH 2 DAYS Performed at [facility], [place] [address], [place], [place] [address]    Report Status PENDING  Incomplete     Discharge Instructions:   Discharge Instructions     Diet - low sodium heart healthy   Complete by: As directed    Discharge instructions   Complete by: As directed    Follow-up with your primary care provider in 1 to 2 weeks.    Follow-up with cardiology in 1 week.Check blood work at that time.  Do not take Entresto until seen by cardiology.  Do not take spironolactone, furosemide until 11/14/2021.  Seek medical attention for worsening symptoms.   Heart Failure patients record your daily weight using the same scale at the same time of day   Complete by: As directed    Increase activity slowly   Complete by: As directed       Allergies as of 11/11/2021       Reactions   Lisinopril Cough      Penicillins Other  (See Comments)   Did it involve swelling of the face/tongue/throat, SOB, or low BP? Yes Did it involve sudden or severe rash/hives, skin peeling, or any reaction on the inside of your mouth or nose? No Did you need to seek medical attention at a hospital or doctor's office? No When did it last happen? Unk  If all above answers are "NO", may proceed with cephalosporin use.   *pt tolerated ancef on 08/08/16   Pravastatin Hives      Sulfa Antibiotics Itching, Other (See Comments)   Itching, watery eyes   Atorvastatin Other (See Comments)   Muscle pain   Codeine Hives      Flunisolide Hives   epistaxis Other  reaction(s): Bleeding from nose, Other (See Comments) Hives  Hives    Niacin And Related    Hives    Niaspan [niacin] Hives, Other (See Comments)   Flushing, also   Simvastatin Hives   weakness Other reaction(s): Weakness present   Statins Hives   weakness Other reaction(s): Other (See Comments), Unknown Hives  weakness Hives  Hives  Hives    Ciprofloxacin Rash   Hives  Other reaction(s): Other (See Comments), Other (See Comments) Hives  Hives  Hives    Erythromycin Rash   Hives  Other reaction(s): Other (See Comments), Other (See Comments) Hives  Hives  Hives    Ranitidine Rash   Hives  Other reaction(s): Other (See Comments), Other (See Comments) Hives  Hives  Hives         Medication List     STOP taking these medications    sacubitril-valsartan 24-26 MG Commonly known as: ENTRESTO       TAKE these medications    acetaminophen 325 MG tablet Commonly known as: TYLENOL Take 650 mg by mouth every 6 (six) hours as needed for mild pain or moderate pain.   albuterol (2.5 MG/3ML) 0.083% nebulizer solution Commonly known as: PROVENTIL Take 2.5 mg by nebulization every 4 (four) hours as needed for wheezing or shortness of breath.   albuterol 108 (90 Base) MCG/ACT inhaler Commonly known as: VENTOLIN HFA Inhale 2 puffs into the lungs every 4  (four) hours as needed for wheezing or shortness of breath.   Alvesco 160 MCG/ACT inhaler Generic drug: ciclesonide Inhale 1 puff into the lungs 2 (two) times daily.   apixaban 2.5 MG Tabs tablet Commonly known as: ELIQUIS Take 1 tablet (2.5 mg total) by mouth 2 (two) times daily.   carboxymethylcellulose 0.5 % Soln Commonly known as: REFRESH PLUS Place 1 drop into both eyes as needed (for dry eyes).   cholecalciferol 25 MCG (1000 UNIT) tablet Commonly known as: VITAMIN D3 Take 1,000 Units by mouth See admin instructions. 1 by mouth two times a week on Saturday and Sunday for supplement.   diclofenac Sodium 1 % Gel Commonly known as: VOLTAREN Apply 2 g topically 4 (four) times daily as needed (shoulder pain).   docusate sodium 100 MG capsule Commonly known as: COLACE Take 1 capsule (100 mg total) by mouth 2 (two) times daily as needed for mild constipation.   fluticasone 50 MCG/ACT nasal spray Commonly known as: FLONASE Place 2 sprays into both nostrils daily.   furosemide 40 MG tablet Commonly known as: LASIX Take 1 tablet (40 mg total) by mouth daily. Start taking on: November 14, 2021 What changed: These instructions start on November 14, 2021. If you are unsure what to do until then, ask your doctor or other care provider.   gabapentin 300 MG capsule Commonly known as: NEURONTIN Take 300 mg by mouth 3 (three) times daily.   guaiFENesin 600 MG 12 hr tablet Commonly known as: MUCINEX Take 600 mg by mouth 2 (two) times daily.   latanoprost 0.005 % ophthalmic solution Commonly known as: XALATAN Place 1 drop into both eyes at bedtime.   loratadine 10 MG tablet Commonly known as: CLARITIN Take 10 mg by mouth daily.   Metoprolol Tartrate 37.5 MG Tabs Take 1 tablet by mouth 2 (two) times daily.   omeprazole 20 MG capsule Commonly known as: PRILOSEC Take 20 mg by mouth daily.   Oyster Shell 500 MG Tabs Take 500 mg by mouth 2 (two) times daily.   polyethylene  glycol 17 g packet Commonly known as: MIRALAX / GLYCOLAX Take 17 g by mouth daily as needed for moderate constipation.   PSYLLIUM HUSK PO Take 1 capsule by mouth as needed (for constipation).   sodium chloride 0.65 % Soln nasal spray Commonly known as: OCEAN Place 2 sprays into both nostrils 2 (two) times daily.   spironolactone 25 MG tablet Commonly known as: Aldactone Take 0.5 tablets (12.5 mg total) by mouth daily. Start taking on: November 14, 2021 What changed: These instructions start on November 14, 2021. If you are unsure what to do until then, ask your doctor or other care provider.   Stiolto Respimat 2.5-2.5 MCG/ACT Aers Generic drug: Tiotropium Bromide-Olodaterol Inhale 2 puffs into the lungs daily.        Follow-up Information     [facility] Follow up.   Why: to continue home health services established prior to admission. continue using contact information you were prior to hospital admission, it has not changed Contact information: [place] [address] [phone]                  Time coordinating discharge: 39 minutes  Signed:  Garik Diamant  [facility] 11/11/2021, 12:31 PM

## 2021-11-11 NOTE — TOC Initial Note (Signed)
Transition of Care [facility]) - Initial/Assessment Note    Patient Details  Name: Jordan Hoffman MRN: [id number] Date of Birth: 1928/10/21  Transition of Care [facility]) CM/SW Contact:    Carles Collet, RN Phone Number: 11/11/2021, 10:45 AM  Clinical Narrative:                  Spoke w patient's wife over the phone. She states that plans will be to return home at DC and resume [facility] services with [facility]. Notified [facility] of admission, will need resumption orders for PT OT RN.  Patient has needed DME at [facility] per wife. She checks his BP at hme and has been in contact w [facility] about it being low. She is very satisfied with their response and care received.  [facility]. [facility] notified [facility]. Patient is 60% service connected  PCP Dr Mina Marble Social Worker [place] [phone] ext [place] or [facility]  Expected Discharge Plan: [place] Barriers to Discharge: Continued Medical Work up   Patient Goals and CMS Choice Patient states their goals for this hospitalization and ongoing recovery are:: returm home CMS Medicare.gov Compare Post Acute Care list provided to:: Other (Comment Required) Choice offered to / list presented to : Spouse  Expected Discharge Plan and Services Expected Discharge Plan: [place]   Discharge Planning Services: CM Consult Post Acute Care Choice: Home Health, Durable Medical Equipment Living arrangements for the past 2 months: Single Family Home                           HH Arranged: RN, PT, OT [facility] Agency: Other - See comment Date HH Agency Contacted: 11/11/21 Time HH Agency Contacted: 63 Representative spoke with at [place]: [facility] [place], Candi Leash  Prior Living Arrangements/Services Living arrangements for the past 2 months: [place] with:: Spouse   Do you feel safe going back to the place where you live?: Yes          Current home services:  DME    Activities of Daily Living Home Assistive Devices/Equipment: Grab bars in shower, Shower chair without back, Environmental consultant (specify type) ADL Screening (condition at time of admission) Patient's cognitive ability adequate to safely complete daily activities?: Yes Is the patient deaf or have difficulty hearing?: No Does the patient have difficulty seeing, even when wearing glasses/contacts?: No Does the patient have difficulty concentrating, remembering, or making decisions?: No Patient able to express need for assistance with ADLs?: Yes Does the patient have difficulty dressing or bathing?: No Independently performs ADLs?: Yes (appropriate for developmental age) Does the patient have difficulty walking or climbing stairs?: Yes Weakness of Legs: Both Weakness of Arms/Hands: None  Permission Sought/Granted                  Emotional Assessment              Admission diagnosis:  Dehydration [E86.0] AKI (acute kidney injury) ([place]) [N17.9] Patient Active Problem List   Diagnosis Date Noted   ILD (interstitial lung disease) ([place]) 11/09/2021   CVD (cerebrovascular disease) 11/09/2021   Stage 3a chronic kidney disease (CKD) ([place]) 10/14/2021   Elevated troponin 10/14/2021   Chronic HFrEF (heart failure with reduced ejection fraction) ([place]) 10/13/2021   Malnutrition of moderate degree 09/27/2021   Acute CHF (congestive heart failure) ([place]) 09/25/2021   Acute respiratory failure  with hypoxia ([place]) 09/25/2021   Acute HFrEF (heart failure with reduced ejection fraction) (HCC)    Elevated troponin level not due myocardial infarction    Nonobstructive atherosclerosis of coronary artery    COVID-19 virus infection    Bacterial pneumonia 07/28/2020   Hypoxia 07/28/2020   Paroxysmal atrial fibrillation ([place]) 04/09/2020   AKI (acute kidney injury) ([place]) 04/09/2020   Acute renal failure superimposed on stage 3 chronic kidney disease, unspecified acute renal failure type, unspecified  whether stage 3a or 3b CKD ([place]) 04/08/2020   Near syncope 12/11/2015   Atrial fibrillation ([place]) 12/11/2015   COPD (chronic obstructive pulmonary disease) ([place]) 12/11/2015   OSA (obstructive sleep apnea) 12/11/2015   Hypertension 12/11/2015   Type 2 diabetes mellitus ([place]) 12/11/2015   Hyperlipidemia 12/11/2015   Lumbosacral spondylosis without myelopathy 10/11/2015   Osteoarthritis 06/21/2015   PCP:  Clinic, [place], [place] [address] Phone: [phone] Fax: [phone]  [facility] [place], [place] [address] [place] [address] Phone: [phone] Fax: [phone]  [place], [place] - [address] [place] [address] Phone: [phone] Fax: [place], [place] [address] [place] [address] Phone: [phone] Fax: [phone]  [facility] [address] [place] [address] Phone: [phone] Fax: [phone]     Social Determinants of Health (SDOH) Interventions    Readmission Risk Interventions     No data to display

## 2021-11-11 NOTE — Progress Notes (Signed)
DISCHARGE NOTE HOME [name] to be discharged Home per MD order. Discussed prescriptions and follow up appointments with the patient. Prescriptions given to patient; medication list explained in detail. Patient verbalized understanding.  Skin clean, dry and intact without evidence of skin break down, no evidence of skin tears noted. IV catheter discontinued intact. Site without signs and symptoms of complications. Dressing and pressure applied. Pt denies pain at the site currently. No complaints noted.  Patient free of lines, drains, and wounds.   An After Visit Summary (AVS) was printed and given to the patient. Patient escorted via wheelchair, and discharged home via private auto.  Vira Agar, RN

## 2021-11-11 NOTE — Evaluation (Signed)
Physical Therapy Evaluation Patient Details Name: Jordan Hoffman MRN: [id number] DOB: [birth year] Today's Date: 11/11/2021  History of Present Illness  Pt is a 88 y/o male who presented after increased R sided weakness when working with [facility]. MRI brain negative for acute abnormalities, noted with expected evolution of L MCA from 8/23. Admitted for AKI in setting of over diuresis from recent CHF admissions. PMH: CKD, CHF, ILD, CVD, a fib, COPD, HTn, DM2, HLD, OSA on CPAP  Clinical Impression  Patient admitted with the above. PTA, patient lives with wife and was using RW for mobility and participating with HHPT. Patient presents with weakness, impaired balance, and decreased activity tolerance. Patient required supervision for sit to stand and min guard for hallway ambulation with RW. Patient will benefit from skilled PT services during acute stay to address listed deficits. Recommend return home and resume HHPT at discharge to maximize functional independence.        Recommendations for follow up therapy are one component of a multi-disciplinary discharge planning process, led by the attending physician.  Recommendations may be updated based on patient status, additional functional criteria and insurance authorization.  Follow Up Recommendations Home health PT      Assistance Recommended at Discharge Frequent or constant Supervision/Assistance  Patient can return home with the following       Equipment Recommendations None recommended by PT  Recommendations for Other Services       Functional Status Assessment Patient has had a recent decline in their functional status and demonstrates the ability to make significant improvements in function in a reasonable and predictable amount of time.     Precautions / Restrictions Precautions Precautions: Fall Restrictions Weight Bearing Restrictions: No      Mobility  Bed Mobility Overal bed mobility: Modified Independent                   Transfers Overall transfer level: Needs assistance Equipment used: Rolling Tonyia Marschall (2 wheels) Transfers: Sit to/from Stand Sit to Stand: Supervision           General transfer comment: increased time to complete    Ambulation/Gait Ambulation/Gait assistance: Min guard Gait Distance (Feet): 200 Feet Assistive device: Rolling Sahirah Rudell (2 wheels) Gait Pattern/deviations: Step-through pattern, Decreased stride length, Trunk flexed Gait velocity: decreased     General Gait Details: min guard for safety. No LOB noted.  Stairs            Wheelchair Mobility    Modified Rankin (Stroke Patients Only)       Balance Overall balance assessment: Needs assistance Sitting-balance support: No upper extremity supported, Feet supported Sitting balance-Leahy Scale: Fair     Standing balance support: No upper extremity supported, Bilateral upper extremity supported, During functional activity Standing balance-Leahy Scale: Fair                               Pertinent Vitals/Pain Pain Assessment Pain Assessment: No/denies pain    Home Living Family/patient expects to be discharged to:: Private residence Living Arrangements: Spouse/significant other Available Help at Discharge: Family;Available 24 hours/day Type of Home: House Home Access: Level entry;Stairs to enter   Entrance Stairs-Number of Steps: 2 steps through front door, 1 step through back door   Home Layout: One level;Other (Comment) (2 steps from kitchen to den) Home Equipment: Kasandra Knudsen - single Barista (2 wheels);Rollator (4 wheels);Grab bars - tub/shower;Shower seat      Prior Function Prior  Level of Function : Needs assist             Mobility Comments: using RW since CVA and working with HHPT ADLs Comments: Was independent with ADLs and light IADLs prior to recent series of hosptial admissions. Wife assisting some with ADLs now and pt working with [facility]         Extremity/Trunk Assessment   Upper Extremity Assessment Upper Extremity Assessment: Defer to OT evaluation    Lower Extremity Assessment Lower Extremity Assessment: Generalized weakness    Cervical / Trunk Assessment Cervical / Trunk Assessment: Normal  Communication   Communication: No difficulties  Cognition Arousal/Alertness: Awake/alert Behavior During Therapy: WFL for tasks assessed/performed Overall Cognitive Status: No family/caregiver present to determine baseline cognitive functioning                                 General Comments: seems WFL based on mobility tasks but no family present        General Comments      Exercises     Assessment/Plan    PT Assessment Patient needs continued PT services  PT Problem List Decreased strength;Decreased activity tolerance;Decreased balance;Decreased mobility       PT Treatment Interventions DME instruction;Gait training;Stair training;Functional mobility training;Therapeutic activities;Therapeutic exercise;Balance training;Patient/family education    PT Goals (Current goals can be found in the Care Plan section)  Acute Rehab PT Goals Patient Stated Goal: to go home PT Goal Formulation: With patient Time For Goal Achievement: 11/25/21 Potential to Achieve Goals: Good    Frequency Min 3X/week     Co-evaluation               AM-PAC PT "6 Clicks" Mobility  Outcome Measure Help needed turning from your back to your side while in a flat bed without using bedrails?: None Help needed moving from lying on your back to sitting on the side of a flat bed without using bedrails?: None Help needed moving to and from a bed to a chair (including a wheelchair)?: A Little Help needed standing up from a chair using your arms (e.g., wheelchair or bedside chair)?: A Little Help needed to walk in hospital room?: A Little Help needed climbing 3-5 steps with a railing? : A Lot 6 Click Score: 19    End of  Session Equipment Utilized During Treatment: Gait belt Activity Tolerance: Patient tolerated treatment well Patient left: in bed;with call bell/phone within reach (seated EOB) Nurse Communication: Mobility status PT Visit Diagnosis: Unsteadiness on feet (R26.81);Muscle weakness (generalized) (M62.81);Other abnormalities of gait and mobility (R26.89)    Time: 3536-1443 PT Time Calculation (min) (ACUTE ONLY): 24 min   Charges:   PT Evaluation $PT Eval Low Complexity: 1 Low PT Treatments $Therapeutic Activity: 8-22 mins        Yvonna Brun A. Gilford Rile PT, DPT Acute Rehabilitation Services Office [phone]   Linna Hoff 11/11/2021, 1:16 PM

## 2021-11-14 LAB — CULTURE, BLOOD (ROUTINE X 2)
Culture: NO GROWTH
Culture: NO GROWTH

## 2022-09-13 ENCOUNTER — Emergency Department (HOSPITAL_COMMUNITY): Payer: No Typology Code available for payment source

## 2022-09-13 ENCOUNTER — Other Ambulatory Visit: Payer: Self-pay

## 2022-09-13 ENCOUNTER — Inpatient Hospital Stay (HOSPITAL_COMMUNITY)
Admission: EM | Admit: 2022-09-13 | Discharge: 2022-09-18 | DRG: 871 | Disposition: A | Discharge status: Home / Self Care | Payer: No Typology Code available for payment source | Attending: Internal Medicine | Admitting: Internal Medicine

## 2022-09-13 DIAGNOSIS — J440 Chronic obstructive pulmonary disease with acute lower respiratory infection: Secondary | ICD-10-CM | POA: Diagnosis present

## 2022-09-13 DIAGNOSIS — J189 Pneumonia, unspecified organism: Secondary | ICD-10-CM | POA: Diagnosis present

## 2022-09-13 DIAGNOSIS — N390 Urinary tract infection, site not specified: Secondary | ICD-10-CM | POA: Diagnosis present

## 2022-09-13 DIAGNOSIS — Z7901 Long term (current) use of anticoagulants: Secondary | ICD-10-CM

## 2022-09-13 DIAGNOSIS — Z1152 Encounter for screening for COVID-19: Secondary | ICD-10-CM | POA: Diagnosis not present

## 2022-09-13 DIAGNOSIS — G4733 Obstructive sleep apnea (adult) (pediatric): Secondary | ICD-10-CM | POA: Diagnosis present

## 2022-09-13 DIAGNOSIS — I10 Essential (primary) hypertension: Secondary | ICD-10-CM | POA: Diagnosis not present

## 2022-09-13 DIAGNOSIS — R509 Fever, unspecified: Secondary | ICD-10-CM | POA: Diagnosis not present

## 2022-09-13 DIAGNOSIS — A419 Sepsis, unspecified organism: Secondary | ICD-10-CM | POA: Diagnosis present

## 2022-09-13 DIAGNOSIS — I5022 Chronic systolic (congestive) heart failure: Secondary | ICD-10-CM | POA: Diagnosis present

## 2022-09-13 DIAGNOSIS — I2489 Other forms of acute ischemic heart disease: Secondary | ICD-10-CM | POA: Diagnosis not present

## 2022-09-13 DIAGNOSIS — Z880 Allergy status to penicillin: Secondary | ICD-10-CM

## 2022-09-13 DIAGNOSIS — N400 Enlarged prostate without lower urinary tract symptoms: Secondary | ICD-10-CM | POA: Diagnosis present

## 2022-09-13 DIAGNOSIS — I2510 Atherosclerotic heart disease of native coronary artery without angina pectoris: Secondary | ICD-10-CM | POA: Diagnosis present

## 2022-09-13 DIAGNOSIS — I110 Hypertensive heart disease with heart failure: Secondary | ICD-10-CM | POA: Diagnosis present

## 2022-09-13 DIAGNOSIS — I4819 Other persistent atrial fibrillation: Secondary | ICD-10-CM | POA: Diagnosis present

## 2022-09-13 DIAGNOSIS — I472 Ventricular tachycardia, unspecified: Secondary | ICD-10-CM | POA: Diagnosis not present

## 2022-09-13 DIAGNOSIS — Z882 Allergy status to sulfonamides status: Secondary | ICD-10-CM

## 2022-09-13 DIAGNOSIS — R0902 Hypoxemia: Secondary | ICD-10-CM | POA: Diagnosis present

## 2022-09-13 DIAGNOSIS — Z87891 Personal history of nicotine dependence: Secondary | ICD-10-CM

## 2022-09-13 DIAGNOSIS — Z8701 Personal history of pneumonia (recurrent): Secondary | ICD-10-CM

## 2022-09-13 DIAGNOSIS — Z7951 Long term (current) use of inhaled steroids: Secondary | ICD-10-CM

## 2022-09-13 DIAGNOSIS — D649 Anemia, unspecified: Secondary | ICD-10-CM | POA: Diagnosis present

## 2022-09-13 DIAGNOSIS — B961 Klebsiella pneumoniae [K. pneumoniae] as the cause of diseases classified elsewhere: Secondary | ICD-10-CM | POA: Diagnosis present

## 2022-09-13 DIAGNOSIS — I21A1 Myocardial infarction type 2: Secondary | ICD-10-CM | POA: Diagnosis present

## 2022-09-13 DIAGNOSIS — I4891 Unspecified atrial fibrillation: Secondary | ICD-10-CM | POA: Diagnosis present

## 2022-09-13 DIAGNOSIS — E7800 Pure hypercholesterolemia, unspecified: Secondary | ICD-10-CM | POA: Diagnosis present

## 2022-09-13 DIAGNOSIS — I214 Non-ST elevation (NSTEMI) myocardial infarction: Secondary | ICD-10-CM

## 2022-09-13 DIAGNOSIS — Z888 Allergy status to other drugs, medicaments and biological substances status: Secondary | ICD-10-CM

## 2022-09-13 DIAGNOSIS — E119 Type 2 diabetes mellitus without complications: Secondary | ICD-10-CM | POA: Diagnosis present

## 2022-09-13 DIAGNOSIS — N179 Acute kidney failure, unspecified: Secondary | ICD-10-CM | POA: Diagnosis present

## 2022-09-13 DIAGNOSIS — I5021 Acute systolic (congestive) heart failure: Secondary | ICD-10-CM | POA: Diagnosis not present

## 2022-09-13 DIAGNOSIS — E876 Hypokalemia: Secondary | ICD-10-CM | POA: Diagnosis present

## 2022-09-13 DIAGNOSIS — I429 Cardiomyopathy, unspecified: Secondary | ICD-10-CM | POA: Diagnosis present

## 2022-09-13 DIAGNOSIS — Z9079 Acquired absence of other genital organ(s): Secondary | ICD-10-CM

## 2022-09-13 DIAGNOSIS — R7989 Other specified abnormal findings of blood chemistry: Secondary | ICD-10-CM | POA: Diagnosis not present

## 2022-09-13 DIAGNOSIS — Z881 Allergy status to other antibiotic agents status: Secondary | ICD-10-CM

## 2022-09-13 DIAGNOSIS — Z96642 Presence of left artificial hip joint: Secondary | ICD-10-CM | POA: Diagnosis present

## 2022-09-13 DIAGNOSIS — Z96651 Presence of right artificial knee joint: Secondary | ICD-10-CM | POA: Diagnosis present

## 2022-09-13 DIAGNOSIS — Z841 Family history of disorders of kidney and ureter: Secondary | ICD-10-CM

## 2022-09-13 DIAGNOSIS — Z79899 Other long term (current) drug therapy: Secondary | ICD-10-CM

## 2022-09-13 DIAGNOSIS — Z885 Allergy status to narcotic agent status: Secondary | ICD-10-CM

## 2022-09-13 LAB — URINALYSIS, W/ REFLEX TO CULTURE (INFECTION SUSPECTED)
Bilirubin Urine: NEGATIVE
Glucose, UA: NEGATIVE mg/dL
Hgb urine dipstick: NEGATIVE
Ketones, ur: NEGATIVE mg/dL
Nitrite: POSITIVE — AB
Protein, ur: NEGATIVE mg/dL
Specific Gravity, Urine: 1.023 (ref 1.005–1.030)
WBC, UA: >50 WBC/hpf (ref 0–5)
pH: 5 (ref 5.0–8.0)

## 2022-09-13 LAB — COMPREHENSIVE METABOLIC PANEL
ALT: 7 U/L (ref 0–44)
AST: 12 U/L — ABNORMAL LOW (ref 15–41)
Albumin: 1.9 g/dL — ABNORMAL LOW (ref 3.5–5.0)
Alkaline Phosphatase: 43 U/L (ref 38–126)
Anion gap: 4 — ABNORMAL LOW (ref 5–15)
BUN: 25 mg/dL — ABNORMAL HIGH (ref 8–23)
CO2: 14 mmol/L — ABNORMAL LOW (ref 22–32)
Calcium: 5.3 mg/dL — CL (ref 8.9–10.3)
Chloride: 127 mmol/L — ABNORMAL HIGH (ref 98–111)
Creatinine, Ser: 0.86 mg/dL (ref 0.61–1.24)
GFR, Estimated: >60 mL/min (ref >60)
Glucose, Bld: 78 mg/dL (ref 70–99)
Potassium: 2.2 mmol/L — CL (ref 3.5–5.1)
Sodium: 145 mmol/L (ref 135–145)
Total Bilirubin: 0.7 mg/dL (ref 0.3–1.2)
Total Protein: 3.6 g/dL — ABNORMAL LOW (ref 6.5–8.1)

## 2022-09-13 LAB — CBC WITH DIFFERENTIAL/PLATELET
Abs Immature Granulocytes: 0.03 10*3/uL (ref 0.00–0.07)
Basophils Absolute: 0 10*3/uL (ref 0.0–0.1)
Basophils Relative: 0 %
Eosinophils Absolute: 0 10*3/uL (ref 0.0–0.5)
Eosinophils Relative: 0 %
HCT: 33.2 % — ABNORMAL LOW (ref 39.0–52.0)
Hemoglobin: 10.3 g/dL — ABNORMAL LOW (ref 13.0–17.0)
Immature Granulocytes: 0 %
Lymphocytes Relative: 5 %
Lymphs Abs: 0.5 10*3/uL — ABNORMAL LOW (ref 0.7–4.0)
MCH: 27.8 pg (ref 26.0–34.0)
MCHC: 31 g/dL (ref 30.0–36.0)
MCV: 89.5 fL (ref 80.0–100.0)
Monocytes Absolute: 0.6 10*3/uL (ref 0.1–1.0)
Monocytes Relative: 6 %
Neutro Abs: 9.1 10*3/uL — ABNORMAL HIGH (ref 1.7–7.7)
Neutrophils Relative %: 89 %
Platelets: 178 10*3/uL (ref 150–400)
RBC: 3.71 MIL/uL — ABNORMAL LOW (ref 4.22–5.81)
RDW: 16.5 % — ABNORMAL HIGH (ref 11.5–15.5)
WBC: 10.2 10*3/uL (ref 4.0–10.5)
nRBC: 0 % (ref 0.0–0.2)

## 2022-09-13 LAB — RESP PANEL BY RT-PCR (RSV, FLU A&B, COVID)  RVPGX2
Influenza A by PCR: NEGATIVE
Influenza B by PCR: NEGATIVE
Resp Syncytial Virus by PCR: NEGATIVE
SARS Coronavirus 2 by RT PCR: NEGATIVE

## 2022-09-13 LAB — I-STAT CG4 LACTIC ACID, ED
Lactic Acid, Venous: 1.1 mmol/L (ref 0.5–1.9)
Lactic Acid, Venous: 1.4 mmol/L (ref 0.5–1.9)

## 2022-09-13 LAB — PROTIME-INR
INR: 1.4 — ABNORMAL HIGH (ref 0.8–1.2)
Prothrombin Time: 17.3 seconds — ABNORMAL HIGH (ref 11.4–15.2)

## 2022-09-13 LAB — APTT: aPTT: 34 seconds (ref 24–36)

## 2022-09-13 MED ORDER — POLYETHYLENE GLYCOL 3350 17 G PO PACK: 17.0000 g | PACK | Freq: Every day | ORAL | Status: DC | PRN

## 2022-09-13 MED ORDER — SODIUM CHLORIDE 0.9 % IV SOLN
100.0000 mg | Freq: Once | INTRAVENOUS | Status: AC
  Administered 2022-09-13: 100 mg via INTRAVENOUS
  Filled 2022-09-13: qty 100

## 2022-09-13 MED ORDER — SODIUM CHLORIDE 0.9 % IV SOLN
100.0000 mg | Freq: Two times a day (BID) | INTRAVENOUS | Status: DC
  Administered 2022-09-14 – 2022-09-18 (×9): 100 mg via INTRAVENOUS
  Filled 2022-09-13 (×9): qty 100

## 2022-09-13 MED ORDER — MAGNESIUM SULFATE IN D5W 1-5 GM/100ML-% IV SOLN
1.0000 g | Freq: Once | INTRAVENOUS | Status: AC
  Administered 2022-09-13: 1 g via INTRAVENOUS
  Filled 2022-09-13: qty 100

## 2022-09-13 MED ORDER — APIXABAN 2.5 MG PO TABS
2.5000 mg | ORAL_TABLET | Freq: Two times a day (BID) | ORAL | Status: DC
  Administered 2022-09-14 (×2): 2.5 mg via ORAL
  Filled 2022-09-13 (×2): qty 1

## 2022-09-13 MED ORDER — SODIUM CHLORIDE 0.9 % IV SOLN
2.0000 g | INTRAVENOUS | Status: DC
  Administered 2022-09-13 – 2022-09-17 (×5): 2 g via INTRAVENOUS
  Filled 2022-09-13 (×5): qty 20

## 2022-09-13 MED ORDER — SODIUM CHLORIDE 0.9% FLUSH
3.0000 mL | Freq: Two times a day (BID) | INTRAVENOUS | Status: DC
  Administered 2022-09-13 – 2022-09-18 (×9): 3 mL via INTRAVENOUS

## 2022-09-13 MED ORDER — METRONIDAZOLE 500 MG/100ML IV SOLN
500.0000 mg | Freq: Two times a day (BID) | INTRAVENOUS | Status: DC
  Administered 2022-09-13 – 2022-09-18 (×10): 500 mg via INTRAVENOUS
  Filled 2022-09-13 (×10): qty 100

## 2022-09-13 MED ORDER — POTASSIUM CHLORIDE 10 MEQ/100ML IV SOLN
10.0000 meq | INTRAVENOUS | Status: AC
  Administered 2022-09-13 (×3): 10 meq via INTRAVENOUS
  Filled 2022-09-13 (×3): qty 100

## 2022-09-13 MED ORDER — LACTATED RINGERS IV BOLUS
500.0000 mL | Freq: Once | INTRAVENOUS | Status: AC
  Administered 2022-09-13: 500 mL via INTRAVENOUS

## 2022-09-13 MED ORDER — ACETAMINOPHEN 650 MG RE SUPP: 650.0000 mg | Freq: Four times a day (QID) | RECTAL | Status: DC | PRN

## 2022-09-13 MED ORDER — IOHEXOL 350 MG/ML SOLN
100.0000 mL | Freq: Once | INTRAVENOUS | Status: AC | PRN
  Administered 2022-09-13: 100 mL via INTRAVENOUS

## 2022-09-13 MED ORDER — ACETAMINOPHEN 325 MG PO TABS
650.0000 mg | ORAL_TABLET | Freq: Once | ORAL | Status: AC
  Administered 2022-09-13: 650 mg via ORAL
  Filled 2022-09-13: qty 2

## 2022-09-13 MED ORDER — ACETAMINOPHEN 325 MG PO TABS
650.0000 mg | ORAL_TABLET | Freq: Four times a day (QID) | ORAL | Status: DC | PRN
  Administered 2022-09-14 – 2022-09-18 (×9): 650 mg via ORAL
  Filled 2022-09-13 (×9): qty 2

## 2022-09-13 MED ORDER — POTASSIUM CHLORIDE 10 MEQ/100ML IV SOLN
INTRAVENOUS | Status: AC
  Administered 2022-09-13: 10 meq via INTRAVENOUS
  Filled 2022-09-13: qty 100

## 2022-09-13 MED ORDER — SODIUM CHLORIDE (PF) 0.9 % IJ SOLN
INTRAMUSCULAR | Status: AC
  Filled 2022-09-13: qty 50

## 2022-09-13 NOTE — ED Provider Notes (Signed)
Care of patient received from prior provider at 5:04 PM, please see their note for complete H/P and care plan.  Received handoff per ED course.  Clinical Course as of 09/13/22 1704  Wed Sep 13, 2022  1413 Echo 09/26/21: "1. Left ventricular ejection fraction, by estimation, is 30 to 35%. The  left ventricle has moderately decreased function. The left ventricle  demonstrates global hypokinesis. There is moderate left ventricular  hypertrophy. Left ventricular diastolic  "  Per my chart review.  [TY]  1532 WBC: 10.2 No WBC to suggest systemic infection [TY]  1532 Lactic Acid, Venous: 1.1 Re-assuring.  [TY]  1654 Stable 89 YOM with a chief complaint of sepsis. Respirations in 29 Hypoxic to 80s on 2LNC. CTA pending. Decreased fluid dose due to CHF.  [CC]    Clinical Course User Index [CC] Glyn Ade, MD [TY] Coral Spikes, DO   CRITICAL CARE Performed by: Glyn Ade  ?  Total critical care time: 45 minutes  Critical care time was exclusive of separately billable procedures and treating other patients.  Critical care was necessary to treat or prevent imminent or life-threatening deterioration.  Critical care was time spent personally by me on the following activities: development of treatment plan with patient and/or surrogate as well as nursing, discussions with consultants, evaluation of patient's response to treatment, examination of patient, obtaining history from patient or surrogate, ordering and performing treatments and interventions, ordering and review of laboratory studies, ordering and review of radiographic studies, pulse oximetry and re-evaluation of patient's condition.  Reassessment: CTA demonstrates right lower lobar consolidation.  Blood pressure downtrending so patient treated with fluids.  Started on sepsis protocol treatment but decreased fluid dose due to history of CHF and concern for volume overload. Responded appropriately to current  therapies.  Consulted hospitalist for admission treatment of sepsis of likely pulmonary etiology.     Glyn Ade, MD 09/13/22 2010

## 2022-09-13 NOTE — Assessment & Plan Note (Signed)
C.w. apixaban . Rate controoled

## 2022-09-13 NOTE — Assessment & Plan Note (Signed)
You are as high as 103.1 F today associated with need for 2 L/min of supplementary oxygen and subjective shortness of breath.  Finding of consolidation on chest x-ray.  Diagnosis of pneumonia.  This seems to be a recurrent event therefore we will request speech and swallow evaluation to see if patient is having silent aspiration.  S/p doxy in ER. Will tret with unasyn + doxy due to concern of r/o aspiration.

## 2022-09-13 NOTE — H&P (Addendum)
History and Physical    Patient: Jordan Hoffman SWF:[id number] DOB: 01/30/1928 DOA: 09/13/2022 DOS: the patient was seen and examined on 09/13/2022 PCP: [facility]  Patient coming from: Home  Chief Complaint:  Chief Complaint  Patient presents with   Fever   Shortness of Breath   HPI: BERGER STAVER is a 89 y.o. male with medical history significant of patient has a history of having 3 episodes of pneumonia over the last 1 year..  Patient was in his usual state of health till about 2 days ago when he reports an insidious onset of cough with scant expectoration associated with sensation of shortness of breath and fevers as high as 103 F.  There is no report of chest pain or loss of consciousness or leg swelling or abdominal pain or nausea vomiting or diarrhea.  Patient shortness of breath was worsened by exertion such as when he would try to walk in the house with his cane.  Patient has also had poor appetite and prostration.  Wife accompanied the patient to the hospital today due to nonresolving symptoms.  Patient is found to requiring 2 L/min of supplementary oxygen in the ER.  Workup is as noted below. Review of Systems: As mentioned in the history of present illness. All other systems reviewed and are negative. Past Medical History:  Diagnosis Date   Atrial fibrillation (HCC)    Back pain    COPD (chronic obstructive pulmonary disease) (HCC)    Diabetes mellitus without complication (HCC)    High cholesterol    Hypertension    Knee pain    Past Surgical History:  Procedure Laterality Date   BACK SURGERY     HIP SURGERY     JOINT REPLACEMENT Left    left hip replacement   MOUTH SURGERY     REPLACEMENT TOTAL KNEE Right    TRANSURETHRAL RESECTION OF PROSTATE     Social History:  reports that he has quit smoking. He has never used smokeless tobacco. He reports that he does not drink alcohol and does not use drugs.  Allergies  Allergen Reactions   Lisinopril Cough         Penicillins Other (See Comments)    Did it involve swelling of the face/tongue/throat, SOB, or low BP? Yes Did it involve sudden or severe rash/hives, skin peeling, or any reaction on the inside of your mouth or nose? No Did you need to seek medical attention at a hospital or doctor's office? No When did it last happen? Unk  If all above answers are "NO", may proceed with cephalosporin use.   *pt tolerated ancef on 08/08/16    Pravastatin Hives        Sulfa Antibiotics Itching and Other (See Comments)    Itching, watery eyes   Atorvastatin Other (See Comments)    Muscle pain    Codeine Hives        Flunisolide Hives    epistaxis Other reaction(s): Bleeding from nose, Other (See Comments) Hives  Hives     Niacin And Related     Hives    Niaspan [Niacin] Hives and Other (See Comments)    Flushing, also   Simvastatin Hives    weakness Other reaction(s): Weakness present   Statins Hives    weakness Other reaction(s): Other (See Comments), Unknown Hives  weakness Hives  Hives  Hives     Ciprofloxacin Rash    Hives  Other reaction(s): Other (See Comments), Other (See Comments) Hives  Hives  Hives     Erythromycin Rash    Hives  Other reaction(s): Other (See Comments), Other (See Comments) Hives  Hives  Hives     Ranitidine Rash    Hives  Other reaction(s): Other (See Comments), Other (See Comments) Hives  Hives  Hives      Family History  Problem Relation Age of Onset   Lung cancer Mother        Never smoked   Lung cancer Father    Stroke Sister    Lung cancer Brother    Kidney disease Brother    Heart attack Neg Hx     Prior to Admission medications   Medication Sig Start Date End Date Taking? Authorizing Provider  acetaminophen (TYLENOL) 325 MG tablet Take 650 mg by mouth every 6 (six) hours as needed for mild pain or moderate pain.    [provider]  albuterol (PROVENTIL) (2.5 MG/3ML) 0.083% nebulizer solution Take 2.5 mg by  nebulization every 4 (four) hours as needed for wheezing or shortness of breath.    [provider]  albuterol (VENTOLIN HFA) 108 (90 Base) MCG/ACT inhaler Inhale 2 puffs into the lungs every 4 (four) hours as needed for wheezing or shortness of breath.    [provider]  ALVESCO 160 MCG/ACT inhaler Inhale 1 puff into the lungs 2 (two) times daily. 09/07/21   [provider]  apixaban (ELIQUIS) 2.5 MG TABS tablet Take 1 tablet (2.5 mg total) by mouth 2 (two) times daily. 07/31/20   Glade Lloyd, MD  carboxymethylcellulose (REFRESH PLUS) 0.5 % SOLN Place 1 drop into both eyes as needed (for dry eyes). 08/04/19   [provider]  cholecalciferol (VITAMIN D) 25 MCG (1000 UNIT) tablet Take 1,000 Units by mouth See admin instructions. 1 by mouth two times a week on Saturday and Sunday for supplement.    [provider]  diclofenac Sodium (VOLTAREN) 1 % GEL Apply 2 g topically 4 (four) times daily as needed (shoulder pain).    [provider]  docusate sodium (COLACE) 100 MG capsule Take 1 capsule (100 mg total) by mouth 2 (two) times daily as needed for mild constipation. 09/28/21   Leroy Sea, MD  fluticasone (FLONASE) 50 MCG/ACT nasal spray Place 2 sprays into both nostrils daily.    [provider]  furosemide (LASIX) 40 MG tablet Take 1 tablet (40 mg total) by mouth daily. 11/14/21   Pokhrel, Rebekah Chesterfield, MD  gabapentin (NEURONTIN) 300 MG capsule Take 300 mg by mouth 3 (three) times daily.    [provider]  guaiFENesin (MUCINEX) 600 MG 12 hr tablet Take 600 mg by mouth 2 (two) times daily.    [provider]  latanoprost (XALATAN) 0.005 % ophthalmic solution Place 1 drop into both eyes at bedtime.    [provider]  loratadine (CLARITIN) 10 MG tablet Take 10 mg by mouth daily.    [provider]  Metoprolol Tartrate 37.5 MG TABS Take 1 tablet by mouth 2 (two) times daily. 09/06/21   [provider]  omeprazole (PRILOSEC) 20 MG capsule Take 20 mg by mouth daily.    [provider]  Oyster Shell 500 MG TABS Take 500 mg by mouth 2 (two) times daily.    [provider]  polyethylene glycol (MIRALAX / GLYCOLAX) 17 g packet Take 17 g by mouth daily as needed for moderate constipation. 09/28/21   Leroy Sea, MD  PSYLLIUM HUSK PO Take 1  capsule by mouth as needed (for constipation).    [provider]  sodium chloride (OCEAN) 0.65 % SOLN nasal spray Place 2 sprays into both nostrils 2 (two) times daily.    [provider]  spironolactone (ALDACTONE) 25 MG tablet Take 0.5 tablets (12.5 mg total) by mouth daily. 11/14/21 11/14/22  Pokhrel, Rebekah Chesterfield, MD  STIOLTO RESPIMAT 2.5-2.5 MCG/ACT AERS Inhale 2 puffs into the lungs daily. 09/06/21   [provider]    Physical Exam: Vitals:   09/13/22 1600 09/13/22 1700 09/13/22 1830 09/13/22 2020  BP: 101/66 94/63 96/62  126/86  Pulse: 88 88 78 72  Resp: (!) 27 (!) 23 (!) 24 19  Temp:    98.3 F (36.8 C)  TempSrc:    Oral  SpO2: 99% 100% 100% 100%  Weight:      Height:       General: Fatigued appearing gentleman, does not appear to be in any distress On 2 L/min of supplementary oxygen Gives coherent account of his symptoms.  Although helped along by his wife. Respiratory exam: Right basilar crackle left basilar fine crackles posteriorly Cardiovascular exam S1-S2 normal Abdomen all quadrants soft nontender Extremities warm without edema no focal motor deficit Data Reviewed:  Labs on Admission:  Results for orders placed or performed during the hospital encounter of 09/13/22 (from the past 24 hour(s))  Resp panel by RT-PCR (RSV, Flu A&B, Covid) Anterior Nasal Swab     Status: None   Collection Time: 09/13/22  1:39 PM   Specimen: Anterior Nasal Swab  Result Value Ref Range   SARS Coronavirus 2 by RT PCR NEGATIVE NEGATIVE   Influenza A by PCR NEGATIVE NEGATIVE   Influenza B by PCR  NEGATIVE NEGATIVE   Resp Syncytial Virus by PCR NEGATIVE NEGATIVE  Comprehensive metabolic panel     Status: Abnormal   Collection Time: 09/13/22  2:35 PM  Result Value Ref Range   Sodium 145 135 - 145 mmol/L   Potassium 2.2 (LL) 3.5 - 5.1 mmol/L   Chloride 127 (H) 98 - 111 mmol/L   CO2 14 (L) 22 - 32 mmol/L   Glucose, Bld 78 70 - 99 mg/dL   BUN 25 (H) 8 - 23 mg/dL   Creatinine, Ser 8.29 0.61 - 1.24 mg/dL   Calcium 5.3 (LL) 8.9 - 10.3 mg/dL   Total Protein 3.6 (L) 6.5 - 8.1 g/dL   Albumin 1.9 (L) 3.5 - 5.0 g/dL   AST 12 (L) 15 - 41 U/L   ALT 7 0 - 44 U/L   Alkaline Phosphatase 43 38 - 126 U/L   Total Bilirubin 0.7 0.3 - 1.2 mg/dL   GFR, Estimated >56 >21 mL/min   Anion gap 4 (L) 5 - 15  CBC with Differential     Status: Abnormal   Collection Time: 09/13/22  2:35 PM  Result Value Ref Range   WBC 10.2 4.0 - 10.5 K/uL   RBC 3.71 (L) 4.22 - 5.81 MIL/uL   Hemoglobin 10.3 (L) 13.0 - 17.0 g/dL   HCT 30.8 (L) 65.7 - 84.6 %   MCV 89.5 80.0 - 100.0 fL   MCH 27.8 26.0 - 34.0 pg   MCHC 31.0 30.0 - 36.0 g/dL   RDW 96.2 (H) 95.2 - 84.1 %   Platelets 178 150 - 400 K/uL   nRBC 0.0 0.0 - 0.2 %   Neutrophils Relative % 89 %   Neutro Abs 9.1 (H) 1.7 - 7.7 K/uL   Lymphocytes Relative 5 %  Lymphs Abs 0.5 (L) 0.7 - 4.0 K/uL   Monocytes Relative 6 %   Monocytes Absolute 0.6 0.1 - 1.0 K/uL   Eosinophils Relative 0 %   Eosinophils Absolute 0.0 0.0 - 0.5 K/uL   Basophils Relative 0 %   Basophils Absolute 0.0 0.0 - 0.1 K/uL   Immature Granulocytes 0 %   Abs Immature Granulocytes 0.03 0.00 - 0.07 K/uL  Protime-INR     Status: Abnormal   Collection Time: 09/13/22  2:35 PM  Result Value Ref Range   Prothrombin Time 17.3 (H) 11.4 - 15.2 seconds   INR 1.4 (H) 0.8 - 1.2  APTT     Status: None   Collection Time: 09/13/22  2:35 PM  Result Value Ref Range   aPTT 34 24 - 36 seconds  I-Stat Lactic Acid, ED     Status: None   Collection Time: 09/13/22  2:46 PM  Result Value Ref Range   Lactic  Acid, Venous 1.1 0.5 - 1.9 mmol/L  I-Stat Lactic Acid, ED     Status: None   Collection Time: 09/13/22  6:22 PM  Result Value Ref Range   Lactic Acid, Venous 1.4 0.5 - 1.9 mmol/L   Basic Metabolic Panel: Recent Labs  Lab 09/13/22 1435  NA 145  K 2.2*  CL 127*  CO2 14*  GLUCOSE 78  BUN 25*  CREATININE 0.86  CALCIUM 5.3*   Liver Function Tests: Recent Labs  Lab 09/13/22 1435  AST 12*  ALT 7  ALKPHOS 43  BILITOT 0.7  PROT 3.6*  ALBUMIN 1.9*   No results for input(s): "LIPASE", "AMYLASE" in the last 168 hours. No results for input(s): "AMMONIA" in the last 168 hours. CBC: Recent Labs  Lab 09/13/22 1435  WBC 10.2  NEUTROABS 9.1*  HGB 10.3*  HCT 33.2*  MCV 89.5  PLT 178   Cardiac Enzymes: No results for input(s): "CKTOTAL", "CKMB", "CKMBINDEX", "TROPONINIHS" in the last 168 hours.  BNP (last 3 results) No results for input(s): "PROBNP" in the last 8760 hours. CBG: No results for input(s): "GLUCAP" in the last 168 hours.  Radiological Exams on Admission:  CT Angio Chest PE W and/or Wo Contrast  Result Date: 09/13/2022 CLINICAL DATA:  Fever, shortness of breath and congestion. EXAM: CT ANGIOGRAPHY CHEST WITH CONTRAST TECHNIQUE: Multidetector CT imaging of the chest was performed using the standard protocol during bolus administration of intravenous contrast. Multiplanar CT image reconstructions and MIPs were obtained to evaluate the vascular anatomy. RADIATION DOSE REDUCTION: This exam was performed according to the departmental dose-optimization program which includes automated exposure control, adjustment of the mA and/or kV according to patient size and/or use of iterative reconstruction technique. CONTRAST:  OMNIPAQUE IOHEXOL 350 MG/ML SOLN COMPARISON:  Chest x-ray 09/13/2022 earlier. Older x-rays as well. No prior chest CT FINDINGS: Cardiovascular: Enlarged heart particularly the right atrium. Trace pericardial fluid. The thoracic aorta has a normal course  and caliber with scattered vascular calcifications. Coronary artery calcifications are seen there is breathing motion seen throughout the examination. This limits evaluation for pulmonary emboli, nondiagnostic for small and peripheral emboli. No segmental or larger pulmonary embolism identified. There is some enlargement of the central pulmonary arteries. Please correlate for any evidence of pulmonary artery hypertension. Mediastinum/Nodes: No specific abnormal lymph node enlargement identified in the axillary regions. Enlarged right hilar lymph node identified on series 4, image 66 measuring 2.9 by 1.8 cm. There are some small mediastinal nodes as well. This includes a right-sided precarinal node measuring  16 x 29 mm. A few other smaller nodes elsewhere. Patulous fluid-filled esophagus. Lungs/Pleura: Breathing motion. Area of consolidative opacity seen at the right lung base. Mild more bandlike opacity left lung base. Tiny pleural effusions. Apical pleural thickening. Centrilobular paraseptal lung changes identified. Air cysts in the right lower lobe measuring 2.7 cm. This may have some luminal fluid Upper Abdomen: Adrenal glands are preserved in the upper abdomen. Previous cholecystectomy. Musculoskeletal: Moderate degenerative changes along the spine. Scattered degenerative changes. Review of the MIP images confirms the above findings. IMPRESSION: Breathing motion. No segmental or larger pulmonary embolism. There is some enlargement of the pulmonary arteries. Please correlate for evidence of pulmonary artery hypertension. Enlarged heart particularly the right atrium. Consolidative opacity in the right lung base. More bandlike opacity left lung base. Tiny effusions. Infiltrative process is possible. Recommend follow-up. Few enlarged right hilar and mediastinal lymph nodes. These also can be assessed on follow-up. Aortic Atherosclerosis (ICD10-I70.0) and Emphysema (ICD10-J43.9). Electronically Signed   By: Karen Kays M.D.   On: 09/13/2022 17:49   DG Chest Port 1 View  Result Date: 09/13/2022 CLINICAL DATA:  Questionable sepsis.  Shortness of breath EXAM: PORTABLE CHEST 1 VIEW COMPARISON:  Chest x-ray 11/09/2021 FINDINGS: Enlarged cardiopericardial silhouette. Interstitial changes once again identified. No consolidation, pneumothorax or effusion. Slight elevation of the left hemidiaphragm. Slight left basilar atelectasis. Overlapping cardiac leads. Degenerative changes of the spine and shoulders. IMPRESSION: Enlarged cardiopericardial silhouette. Slight elevation left hemidiaphragm. Slight left basilar atelectasis. Extensive interstitial changes, similar to previous. Please correlate with the history. Electronically Signed   By: Karen Kays M.D.   On: 09/13/2022 15:00    EKG: Independently reviewed. afib   Assessment and Plan: Febrile illness You are as high as 103.1 F today associated with need for 2 L/min of supplementary oxygen and subjective shortness of breath.  Finding of consolidation on chest x-ray.  Diagnosis of pneumonia.  This seems to be a recurrent event therefore we will request speech and swallow evaluation to see if patient is having silent aspiration.  S/p doxy in ER. Will tret with unasyn + doxy due to concern of r/o aspiration.  Hypokalemia Severe, associated with low bicarb, severe appearing hypocalcemia.  I will confirm this with a stat BMP, patient has received 4 KCl runs of 10 mEq each already.  Maintain patient on telemetry till then.  Patient seems to be asymptomatic from this regard.   Home med rec pending.   Advance Care Planning:   Code Status: Prior patient is full code, he has remotely been DNR, however this was rescinded.  Again patient is full code  Consults: speech and swallow eval  Family Communication: wife at bedside. All questions answered.  Severity of Illness: The appropriate patient status for this patient is INPATIENT. Inpatient status is judged to be  reasonable and necessary in order to provide the required intensity of service to ensure the patient's safety. The patient's presenting symptoms, physical exam findings, and initial radiographic and laboratory data in the context of their chronic comorbidities is felt to place them at high risk for further clinical deterioration. Furthermore, it is not anticipated that the patient will be medically stable for discharge from the hospital within 2 midnights of admission.   * I certify that at the point of admission it is my clinical judgment that the patient will require inpatient hospital care spanning beyond 2 midnights from the point of admission due to high intensity of service, high risk for further deterioration and  high frequency of surveillance required.*  Author: Nolberto Hanlon, MD 09/13/2022 8:23 PM  For on call review [email or web address].

## 2022-09-13 NOTE — ED Provider Notes (Signed)
[place] EMERGENCY DEPARTMENT AT [facility] Provider Note   CSN: [id number] Arrival date & time: 09/13/22  1311     History  Chief Complaint  Patient presents with   Fever   Shortness of Breath    ISIAC MACHIN is a 89 y.o. male.  Is a 89 year old male presenting emergency department from home with chief complaint of fever and chills and generalized malaise.  Reports having some chest congestion and nonproductive cough.  Denies chest pain, no abdominal pain no nausea no vomiting no diarrhea.  No other overt sources of infection.   Fever Shortness of Breath Associated symptoms: fever        Home Medications Prior to Admission medications   Medication Sig Start Date End Date Taking? Authorizing Provider  acetaminophen (TYLENOL) 325 MG tablet Take 650 mg by mouth every 6 (six) hours as needed for mild pain or moderate pain.    [provider]  albuterol (PROVENTIL) (2.5 MG/3ML) 0.083% nebulizer solution Take 2.5 mg by nebulization every 4 (four) hours as needed for wheezing or shortness of breath.    [provider]  albuterol (VENTOLIN HFA) 108 (90 Base) MCG/ACT inhaler Inhale 2 puffs into the lungs every 4 (four) hours as needed for wheezing or shortness of breath.    [provider]  ALVESCO 160 MCG/ACT inhaler Inhale 1 puff into the lungs 2 (two) times daily. 09/07/21   [provider]  apixaban (ELIQUIS) 2.5 MG TABS tablet Take 1 tablet (2.5 mg total) by mouth 2 (two) times daily. 07/31/20   Glade Lloyd, MD  carboxymethylcellulose (REFRESH PLUS) 0.5 % SOLN Place 1 drop into both eyes as needed (for dry eyes). 08/04/19   [provider]  cholecalciferol (VITAMIN D) 25 MCG (1000 UNIT) tablet Take 1,000 Units by mouth See admin instructions. 1 by mouth two times a week on Saturday and Sunday for supplement.    [provider]  diclofenac Sodium (VOLTAREN) 1 % GEL Apply 2 g topically 4 (four) times daily as needed  (shoulder pain).    [provider]  docusate sodium (COLACE) 100 MG capsule Take 1 capsule (100 mg total) by mouth 2 (two) times daily as needed for mild constipation. 09/28/21   Leroy Sea, MD  fluticasone (FLONASE) 50 MCG/ACT nasal spray Place 2 sprays into both nostrils daily.    [provider]  furosemide (LASIX) 40 MG tablet Take 1 tablet (40 mg total) by mouth daily. 11/14/21   Pokhrel, Rebekah Chesterfield, MD  gabapentin (NEURONTIN) 300 MG capsule Take 300 mg by mouth 3 (three) times daily.    [provider]  guaiFENesin (MUCINEX) 600 MG 12 hr tablet Take 600 mg by mouth 2 (two) times daily.    [provider]  latanoprost (XALATAN) 0.005 % ophthalmic solution Place 1 drop into both eyes at bedtime.    [provider]  loratadine (CLARITIN) 10 MG tablet Take 10 mg by mouth daily.    [provider]  Metoprolol Tartrate 37.5 MG TABS Take 1 tablet by mouth 2 (two) times daily. 09/06/21   [provider]  omeprazole (PRILOSEC) 20 MG capsule Take 20 mg by mouth daily.    [provider]  Oyster Shell 500 MG TABS Take 500 mg by mouth 2 (two) times daily.    [provider]  polyethylene glycol (MIRALAX / GLYCOLAX) 17 g packet Take 17 g by mouth daily as needed for moderate constipation. 09/28/21   Leroy Sea,  MD  PSYLLIUM HUSK PO Take 1 capsule by mouth as needed (for constipation).    [provider]  sodium chloride (OCEAN) 0.65 % SOLN nasal spray Place 2 sprays into both nostrils 2 (two) times daily.    [provider]  spironolactone (ALDACTONE) 25 MG tablet Take 0.5 tablets (12.5 mg total) by mouth daily. 11/14/21 11/14/22  Pokhrel, Rebekah Chesterfield, MD  STIOLTO RESPIMAT 2.5-2.5 MCG/ACT AERS Inhale 2 puffs into the lungs daily. 09/06/21   [provider]      Allergies    Lisinopril, Penicillins, Pravastatin, Sulfa antibiotics, Atorvastatin, Codeine, Flunisolide, Niacin and related, Niaspan  [niacin], Simvastatin, Statins, Ciprofloxacin, Erythromycin, and Ranitidine    Review of Systems   Review of Systems  Constitutional:  Positive for fever.  Respiratory:  Positive for shortness of breath.     Physical Exam Updated Vital Signs BP 101/66   Pulse 88   Temp (!) 103.1 F (39.5 C) (Oral)   Resp (!) 27   Ht 6\' 4"  (1.93 m)   Wt 81 kg   SpO2 99%   BMI 21.74 kg/m  Physical Exam Vitals and nursing note reviewed.  Constitutional:      General: He is not in acute distress.    Appearance: He is not ill-appearing.  HENT:     Head: Normocephalic and atraumatic.  Cardiovascular:     Rate and Rhythm: Normal rate and regular rhythm.  Pulmonary:     Effort: Pulmonary effort is normal. No tachypnea.     Comments: Does not appear to be in respiratory distress.  Coarse breath sounds throughout.  Chest:     Chest wall: No tenderness.  Abdominal:     Palpations: Abdomen is soft.     Tenderness: There is no abdominal tenderness.  Musculoskeletal:     Cervical back: Normal range of motion.     Right lower leg: No edema.     Left lower leg: No edema.  Skin:    Capillary Refill: Capillary refill takes less than 2 seconds.  Neurological:     Mental Status: He is alert and oriented to person, place, and time.  Psychiatric:        Mood and Affect: Mood normal.        Behavior: Behavior normal.     ED Results / Procedures / Treatments   Labs (all labs ordered are listed, but only abnormal results are displayed) Labs Reviewed  COMPREHENSIVE METABOLIC PANEL - Abnormal; Notable for the following components:      Result Value   Potassium 2.2 (*)    Chloride 127 (*)    CO2 14 (*)    BUN 25 (*)    Calcium 5.3 (*)    Total Protein 3.6 (*)    Albumin 1.9 (*)    AST 12 (*)    Anion gap 4 (*)    All other components within normal limits  CBC WITH DIFFERENTIAL/PLATELET - Abnormal; Notable for the following components:   RBC 3.71 (*)    Hemoglobin 10.3 (*)    HCT 33.2 (*)     RDW 16.5 (*)    Neutro Abs 9.1 (*)    Lymphs Abs 0.5 (*)    All other components within normal limits  PROTIME-INR - Abnormal; Notable for the following components:   Prothrombin Time 17.3 (*)    INR 1.4 (*)    All other components within normal limits  RESP PANEL BY RT-PCR (RSV, FLU A&B, COVID)  RVPGX2  CULTURE, BLOOD (  ROUTINE X 2)  CULTURE, BLOOD (ROUTINE X 2)  APTT  URINALYSIS, W/ REFLEX TO CULTURE (INFECTION SUSPECTED)  I-STAT CG4 LACTIC ACID, ED  I-STAT CG4 LACTIC ACID, ED    EKG None  Radiology DG Chest Port 1 View  Result Date: 09/13/2022 CLINICAL DATA:  Questionable sepsis.  Shortness of breath EXAM: PORTABLE CHEST 1 VIEW COMPARISON:  Chest x-ray 11/09/2021 FINDINGS: Enlarged cardiopericardial silhouette. Interstitial changes once again identified. No consolidation, pneumothorax or effusion. Slight elevation of the left hemidiaphragm. Slight left basilar atelectasis. Overlapping cardiac leads. Degenerative changes of the spine and shoulders. IMPRESSION: Enlarged cardiopericardial silhouette. Slight elevation left hemidiaphragm. Slight left basilar atelectasis. Extensive interstitial changes, similar to previous. Please correlate with the history. Electronically Signed   By: Karen Kays M.D.   On: 09/13/2022 15:00    Procedures Procedures    Medications Ordered in ED Medications  potassium chloride 10 mEq in 100 mL IVPB (has no administration in time range)  magnesium sulfate IVPB 1 g 100 mL (has no administration in time range)  lactated ringers bolus 500 mL (has no administration in time range)  doxycycline (VIBRAMYCIN) 100 mg in sodium chloride 0.9 % 250 mL IVPB (has no administration in time range)  acetaminophen (TYLENOL) tablet 650 mg (650 mg Oral Given 09/13/22 1445)    ED Course/ Medical Decision Making/ A&P Clinical Course as of 09/13/22 1711  Wed Sep 13, 2022  1413 Echo 09/26/21: "1. Left ventricular ejection fraction, by estimation, is 30 to 35%. The  left  ventricle has moderately decreased function. The left ventricle  demonstrates global hypokinesis. There is moderate left ventricular  hypertrophy. Left ventricular diastolic  "  Per my chart review.  [TY]  1532 WBC: 10.2 No WBC to suggest systemic infection [TY]  1532 Lactic Acid, Venous: 1.1 Re-assuring.  [TY]  1654 Stable 89 YOM with a chief complaint of sepsis. Respirations in 29 Hypoxic to 80s on 2LNC. CTA pending. Decreased fluid dose due to CHF.  [CC]    Clinical Course User Index [CC] Glyn Ade, MD [TY] Coral Spikes, DO                                 Medical Decision Making Is a well-appearing 89 year old male present emergency department with viral syndrome type complaints.  He is febrile, but nontachycardic hemodynamic stable.  Reportedly was hypoxic with EMS with a oxygen saturation in the upper 80s.  Wife who is in the room stated that patient similarly had dark-colored lips.  Patient improved on 2 L nasal cannula.  Does have a history of CHF and COPD, he is not on home oxygen.  Per chart review his last EF was 30%, therefore fluids initially held to avoid overloading patient.  Chest x-ray with interstitial changes, but no overt pneumonia.  His symptoms were thought to have been possibly been secondary to viral etiology, however flu/COVID-negative.  His other sepsis markers largely reassuring with no leukocytosis, no elevated lactate.  However with no other overt source of infection with history of recurrent pneumonia and subsequently developing some tachypnea antibiotics added.  He does have significant electrolyte disturbances with a potassium of 2.2 and a calcium of 5.3.  Given his hypoxia, CTA ordered to evaluate for PE and further evaluate for pneumonia.  Care signed out to afternoon team see ED course for further MDM and disposition; anticipate admission   Amount and/or Complexity of Data Reviewed Labs: ordered.  Decision-making details documented in ED  Course. Radiology: ordered. ECG/medicine tests: ordered.  Risk OTC drugs. Prescription drug management. Decision regarding hospitalization.         Final Clinical Impression(s) / ED Diagnoses Final diagnoses:  None    Rx / DC Orders ED Discharge Orders     None         Coral Spikes, DO 09/13/22 1711

## 2022-09-13 NOTE — ED Triage Notes (Signed)
Pt BIBA from home. C/O SOB, and fever for past several days.   Hx of afib. HR 80-100 w/EMS  Given 200 mL NS   18 LAC  88 on RA- placed on 2L [place], up to 97

## 2022-09-13 NOTE — Assessment & Plan Note (Signed)
Severe, associated with low bicarb, severe appearing hypocalcemia.  I will confirm this with a stat BMP, patient has received 4 KCl runs of 10 mEq each already.  Maintain patient on telemetry till then.  Patient seems to be asymptomatic from this regard.

## 2022-09-14 ENCOUNTER — Other Ambulatory Visit (HOSPITAL_COMMUNITY): Payer: No Typology Code available for payment source

## 2022-09-14 DIAGNOSIS — J189 Pneumonia, unspecified organism: Secondary | ICD-10-CM

## 2022-09-14 DIAGNOSIS — I429 Cardiomyopathy, unspecified: Secondary | ICD-10-CM

## 2022-09-14 DIAGNOSIS — I214 Non-ST elevation (NSTEMI) myocardial infarction: Secondary | ICD-10-CM

## 2022-09-14 DIAGNOSIS — I10 Essential (primary) hypertension: Secondary | ICD-10-CM

## 2022-09-14 LAB — CBC
HCT: 30.9 % — ABNORMAL LOW (ref 39.0–52.0)
Hemoglobin: 9.6 g/dL — ABNORMAL LOW (ref 13.0–17.0)
MCH: 27.9 pg (ref 26.0–34.0)
MCHC: 31.1 g/dL (ref 30.0–36.0)
MCV: 89.8 fL (ref 80.0–100.0)
Platelets: 151 10*3/uL (ref 150–400)
RBC: 3.44 MIL/uL — ABNORMAL LOW (ref 4.22–5.81)
RDW: 16.6 % — ABNORMAL HIGH (ref 11.5–15.5)
WBC: 14 10*3/uL — ABNORMAL HIGH (ref 4.0–10.5)
nRBC: 0 % (ref 0.0–0.2)

## 2022-09-14 LAB — COMPREHENSIVE METABOLIC PANEL
ALT: 12 U/L (ref 0–44)
AST: 22 U/L (ref 15–41)
Albumin: 3.1 g/dL — ABNORMAL LOW (ref 3.5–5.0)
Alkaline Phosphatase: 74 U/L (ref 38–126)
Anion gap: 7 (ref 5–15)
BUN: 39 mg/dL — ABNORMAL HIGH (ref 8–23)
CO2: 21 mmol/L — ABNORMAL LOW (ref 22–32)
Calcium: 8.9 mg/dL (ref 8.9–10.3)
Chloride: 107 mmol/L (ref 98–111)
Creatinine, Ser: 1.56 mg/dL — ABNORMAL HIGH (ref 0.61–1.24)
GFR, Estimated: 41 mL/min — ABNORMAL LOW (ref >60)
Glucose, Bld: 118 mg/dL — ABNORMAL HIGH (ref 70–99)
Potassium: 4.3 mmol/L (ref 3.5–5.1)
Sodium: 135 mmol/L (ref 135–145)
Total Bilirubin: 1.3 mg/dL — ABNORMAL HIGH (ref 0.3–1.2)
Total Protein: 6.3 g/dL — ABNORMAL LOW (ref 6.5–8.1)

## 2022-09-14 LAB — APTT: aPTT: 38 s — ABNORMAL HIGH (ref 24–36)

## 2022-09-14 LAB — TROPONIN I (HIGH SENSITIVITY)
Troponin I (High Sensitivity): 1623 ng/L (ref <18)
Troponin I (High Sensitivity): 1484 ng/L (ref <18)
Troponin I (High Sensitivity): 1123 ng/L (ref <18)
Troponin I (High Sensitivity): 1153 ng/L (ref <18)

## 2022-09-14 LAB — PROTIME-INR
INR: 1.5 — ABNORMAL HIGH (ref 0.8–1.2)
Prothrombin Time: 18.5 s — ABNORMAL HIGH (ref 11.4–15.2)

## 2022-09-14 LAB — BRAIN NATRIURETIC PEPTIDE: B Natriuretic Peptide: 519 pg/mL — ABNORMAL HIGH (ref 0.0–100.0)

## 2022-09-14 LAB — MAGNESIUM: Magnesium: 2 mg/dL (ref 1.7–2.4)

## 2022-09-14 MED ORDER — GABAPENTIN 100 MG PO CAPS
100.0000 mg | ORAL_CAPSULE | Freq: Three times a day (TID) | ORAL | Status: DC | PRN
  Administered 2022-09-14 – 2022-09-17 (×2): 100 mg via ORAL
  Filled 2022-09-14 (×2): qty 1

## 2022-09-14 MED ORDER — APIXABAN 2.5 MG PO TABS: 2.5000 mg | ORAL_TABLET | Freq: Two times a day (BID) | ORAL | Status: DC

## 2022-09-14 MED ORDER — FUROSEMIDE 20 MG PO TABS
20.0000 mg | ORAL_TABLET | Freq: Every day | ORAL | Status: DC
  Administered 2022-09-14 – 2022-09-18 (×5): 20 mg via ORAL
  Filled 2022-09-14 (×5): qty 1

## 2022-09-14 MED ORDER — CALCIUM CARBONATE ANTACID 500 MG PO CHEW
1.0000 | CHEWABLE_TABLET | Freq: Two times a day (BID) | ORAL | Status: DC
  Administered 2022-09-14 – 2022-09-18 (×8): 200 mg via ORAL
  Filled 2022-09-14 (×8): qty 1

## 2022-09-14 MED ORDER — ORAL CARE MOUTH RINSE: 15.0000 mL | OROMUCOSAL | Status: DC | PRN

## 2022-09-14 MED ORDER — SODIUM CHLORIDE 0.9 % IV SOLN: INTRAVENOUS | Status: DC | PRN

## 2022-09-14 MED ORDER — HYDRALAZINE HCL 25 MG PO TABS
25.0000 mg | ORAL_TABLET | Freq: Three times a day (TID) | ORAL | Status: DC
  Administered 2022-09-14 – 2022-09-18 (×12): 25 mg via ORAL
  Filled 2022-09-14 (×12): qty 1

## 2022-09-14 MED ORDER — GUAIFENESIN 100 MG/5ML PO LIQD
5.0000 mL | ORAL | Status: DC | PRN
  Administered 2022-09-15 – 2022-09-17 (×4): 5 mL via ORAL
  Filled 2022-09-14 (×5): qty 10

## 2022-09-14 MED ORDER — ISOSORBIDE MONONITRATE ER 30 MG PO TB24
30.0000 mg | ORAL_TABLET | Freq: Every day | ORAL | Status: DC
  Administered 2022-09-14 – 2022-09-18 (×5): 30 mg via ORAL
  Filled 2022-09-14 (×5): qty 1

## 2022-09-14 MED ORDER — LATANOPROST 0.005 % OP SOLN
1.0000 [drp] | Freq: Every day | OPHTHALMIC | Status: DC
  Administered 2022-09-14 – 2022-09-17 (×4): 1 [drp] via OPHTHALMIC
  Filled 2022-09-14: qty 2.5

## 2022-09-14 MED ORDER — ALBUTEROL SULFATE (2.5 MG/3ML) 0.083% IN NEBU: 3.0000 mL | INHALATION_SOLUTION | RESPIRATORY_TRACT | Status: DC | PRN

## 2022-09-14 MED ORDER — HEPARIN (PORCINE) 25000 UT/250ML-% IV SOLN
1500.0000 [IU]/h | INTRAVENOUS | Status: DC
  Administered 2022-09-14: 950 [IU]/h via INTRAVENOUS
  Administered 2022-09-15: 1350 [IU]/h via INTRAVENOUS
  Filled 2022-09-14 (×2): qty 250

## 2022-09-14 MED ORDER — TAMSULOSIN HCL 0.4 MG PO CAPS
0.4000 mg | ORAL_CAPSULE | Freq: Every evening | ORAL | Status: DC
  Administered 2022-09-14 – 2022-09-17 (×4): 0.4 mg via ORAL
  Filled 2022-09-14 (×4): qty 1

## 2022-09-14 MED ORDER — LORATADINE 10 MG PO TABS
10.0000 mg | ORAL_TABLET | Freq: Every day | ORAL | Status: DC
  Administered 2022-09-14 – 2022-09-18 (×5): 10 mg via ORAL
  Filled 2022-09-14 (×5): qty 1

## 2022-09-14 MED ORDER — GABAPENTIN 100 MG PO CAPS: 100.0000 mg | ORAL_CAPSULE | Freq: Every day | ORAL | Status: DC | PRN

## 2022-09-14 MED ORDER — GABAPENTIN 100 MG PO CAPS: 100.0000 mg | ORAL_CAPSULE | ORAL | Status: DC

## 2022-09-14 MED ORDER — METOPROLOL SUCCINATE ER 50 MG PO TB24
25.0000 mg | ORAL_TABLET | Freq: Every day | ORAL | Status: DC
  Administered 2022-09-14 – 2022-09-18 (×5): 25 mg via ORAL
  Filled 2022-09-14 (×5): qty 1

## 2022-09-14 NOTE — Progress Notes (Signed)
PROGRESS NOTE    Jordan Hoffman  ION:[id number] DOB: 03-24-1928 DOA: 09/13/2022 PCP: [facility]   Brief Narrative:    Jordan Hoffman is a 89 y.o. male with medical history significant of patient has a history of having 3 episodes of pneumonia over the last 1 year..  Patient was in his usual state of health till about 2 days ago when he reports an insidious onset of cough with scant expectoration associated with sensation of shortness of breath and fevers as high as 103 F.  There is no report of chest pain or loss of consciousness or leg swelling or abdominal pain or nausea vomiting or diarrhea.  Patient shortness of breath was worsened by exertion such as when he would try to walk in the house with his cane.   Patient was admitted with pneumonia and associated fever and is also noted to have troponin elevation.  Cardiology evaluation pending and 2D echocardiogram ordered.  Assessment & Plan:   Principal Problem:   Community acquired pneumonia Active Problems:   Atrial fibrillation (HCC)   Hypokalemia   Febrile illness  Assessment and Plan:  Fever in the setting of community-acquired pneumonia -Temperature as high as 103.1 Fahrenheit on admission -Continue current antibiotics with doxycycline, Rocephin, and Flagyl due to concerns for aspiration and multiple drug allergies -Continue close monitoring -SLP evaluation  Elevated troponin Check 2D echocardiogram and repeat appears to be downward trending No complaints of chest pain or heart failure symptoms-appreciate cardiology evaluation  History of atrial fibrillation Continue Eliquis for anticoagulation Continue metoprolol with heart rate adequately controlled on telemetry Check 2D echocardiogram as noted above  Hypertension Currently well-controlled Continue home medications  BPH Continue tamsulosin    DVT prophylaxis:Eliquis Code Status: Full Family Communication: Spouse at bedside 8/29 Disposition Plan:   Status is: Inpatient Remains inpatient appropriate because: Need for IV medications   Consultants:  Cardiology  Procedures:  None  Antimicrobials:  Anti-infectives (From admission, onward)    Start     Dose/Rate Route Frequency Ordered Stop   09/14/22 1000  doxycycline (VIBRAMYCIN) 100 mg in sodium chloride 0.9 % 250 mL IVPB        100 mg 125 mL/hr over 120 Minutes Intravenous Every 12 hours 09/13/22 2033     09/13/22 2100  cefTRIAXone (ROCEPHIN) 2 g in sodium chloride 0.9 % 100 mL IVPB        2 g 200 mL/hr over 30 Minutes Intravenous Every 24 hours 09/13/22 2048     09/13/22 2100  metroNIDAZOLE (FLAGYL) IVPB 500 mg        500 mg 100 mL/hr over 60 Minutes Intravenous Every 12 hours 09/13/22 2048     09/13/22 1730  doxycycline (VIBRAMYCIN) 100 mg in sodium chloride 0.9 % 250 mL IVPB        100 mg 125 mL/hr over 120 Minutes Intravenous  Once 09/13/22 1659 09/13/22 2015      Subjective: Patient seen and evaluated today with no new acute complaints or concerns. No acute concerns or events noted overnight.  Objective: Vitals:   09/14/22 0245 09/14/22 0400 09/14/22 0543 09/14/22 0700  BP:  122/60 109/78 137/78  Pulse: 77 90 83 80  Resp: 20 (!) 29 14 (!) 29  Temp:   99.3 F (37.4 C)   TempSrc:   Oral   SpO2: 100% 98% 98% 95%  Weight:      Height:        Intake/Output Summary (Last 24 hours) at 09/14/2022 0756  Last data filed at 09/14/2022 0151 Gross per 24 hour  Intake 503 ml  Output --  Net 503 ml   Filed Weights   09/13/22 1330  Weight: 81 kg    Examination:  General exam: Appears calm and comfortable  Respiratory system: Clear to auscultation. Respiratory effort normal. Cardiovascular system: S1 & S2 heard, irregular rate and rhythm, heart rate controlled Gastrointestinal system: Abdomen is soft Central nervous system: Alert and awake Extremities: No edema Skin: No significant lesions noted Psychiatry: Flat affect.    Data Reviewed: I have personally  reviewed following labs and imaging studies  CBC: Recent Labs  Lab 09/13/22 1435 09/14/22 0500  WBC 10.2 14.0*  NEUTROABS 9.1*  --   HGB 10.3* 9.6*  HCT 33.2* 30.9*  MCV 89.5 89.8  PLT 178 151   Basic Metabolic Panel: Recent Labs  Lab 09/13/22 1435 09/14/22 0500  NA 145 135  K 2.2* 4.3  CL 127* 107  CO2 14* 21*  GLUCOSE 78 118*  BUN 25* 39*  CREATININE 0.86 1.56*  CALCIUM 5.3* 8.9  MG  --  2.0   GFR: Estimated Creatinine Clearance: 34.6 mL/min (A) (by C-G formula based on SCr of 1.56 mg/dL (H)). Liver Function Tests: Recent Labs  Lab 09/13/22 1435 09/14/22 0500  AST 12* 22  ALT 7 12  ALKPHOS 43 74  BILITOT 0.7 1.3*  PROT 3.6* 6.3*  ALBUMIN 1.9* 3.1*   No results for input(s): "LIPASE", "AMYLASE" in the last 168 hours. No results for input(s): "AMMONIA" in the last 168 hours. Coagulation Profile: Recent Labs  Lab 09/13/22 1435 09/14/22 0500  INR 1.4* 1.5*   Cardiac Enzymes: No results for input(s): "CKTOTAL", "CKMB", "CKMBINDEX", "TROPONINI" in the last 168 hours. BNP (last 3 results) No results for input(s): "PROBNP" in the last 8760 hours. HbA1C: No results for input(s): "HGBA1C" in the last 72 hours. CBG: No results for input(s): "GLUCAP" in the last 168 hours. Lipid Profile: No results for input(s): "CHOL", "HDL", "LDLCALC", "TRIG", "CHOLHDL", "LDLDIRECT" in the last 72 hours. Thyroid Function Tests: No results for input(s): "TSH", "T4TOTAL", "FREET4", "T3FREE", "THYROIDAB" in the last 72 hours. Anemia Panel: No results for input(s): "VITAMINB12", "FOLATE", "FERRITIN", "TIBC", "IRON", "RETICCTPCT" in the last 72 hours. Sepsis Labs: Recent Labs  Lab 09/13/22 1446 09/13/22 1822  LATICACIDVEN 1.1 1.4    Recent Results (from the past 240 hour(s))  Resp panel by RT-PCR (RSV, Flu A&B, Covid) Anterior Nasal Swab     Status: None   Collection Time: 09/13/22  1:39 PM   Specimen: Anterior Nasal Swab  Result Value Ref Range Status   SARS  Coronavirus 2 by RT PCR NEGATIVE NEGATIVE Final    Comment: (NOTE) SARS-CoV-2 target nucleic acids are NOT DETECTED.  The SARS-CoV-2 RNA is generally detectable in upper respiratory specimens during the acute phase of infection. The lowest concentration of SARS-CoV-2 viral copies this assay can detect is 138 copies/mL. A negative result does not preclude SARS-Cov-2 infection and should not be used as the sole basis for treatment or other patient management decisions. A negative result may occur with  improper specimen collection/handling, submission of specimen other than nasopharyngeal swab, presence of viral mutation(s) within the areas targeted by this assay, and inadequate number of viral copies(<138 copies/mL). A negative result must be combined with clinical observations, patient history, and epidemiological information. The expected result is Negative.  Fact Sheet for Patients:  BloggerCourse.com  Fact Sheet for Healthcare Providers:  SeriousBroker.it  This test is no  t yet approved or cleared by the [place] and  has been authorized for detection and/or diagnosis of SARS-CoV-2 by FDA under an Emergency Use Authorization (EUA). This EUA will remain  in effect (meaning this test can be used) for the duration of the COVID-19 declaration under Section 564(b)(1) of the Act, 21 U.S.C.section 360bbb-3(b)(1), unless the authorization is terminated  or revoked sooner.       Influenza A by PCR NEGATIVE NEGATIVE Final   Influenza B by PCR NEGATIVE NEGATIVE Final    Comment: (NOTE) The Xpert Xpress SARS-CoV-2/FLU/RSV plus assay is intended as an aid in the diagnosis of influenza from Nasopharyngeal swab specimens and should not be used as a sole basis for treatment. Nasal washings and aspirates are unacceptable for Xpert Xpress SARS-CoV-2/FLU/RSV testing.  Fact Sheet for  Patients: BloggerCourse.com  Fact Sheet for Healthcare Providers: SeriousBroker.it  This test is not yet approved or cleared by the [place] FDA and has been authorized for detection and/or diagnosis of SARS-CoV-2 by FDA under an Emergency Use Authorization (EUA). This EUA will remain in effect (meaning this test can be used) for the duration of the COVID-19 declaration under Section 564(b)(1) of the Act, 21 U.S.C. section 360bbb-3(b)(1), unless the authorization is terminated or revoked.     Resp Syncytial Virus by PCR NEGATIVE NEGATIVE Final    Comment: (NOTE) Fact Sheet for Patients: BloggerCourse.com  Fact Sheet for Healthcare Providers: SeriousBroker.it  This test is not yet approved or cleared by the [place] FDA and has been authorized for detection and/or diagnosis of SARS-CoV-2 by FDA under an Emergency Use Authorization (EUA). This EUA will remain in effect (meaning this test can be used) for the duration of the COVID-19 declaration under Section 564(b)(1) of the Act, 21 U.S.C. section 360bbb-3(b)(1), unless the authorization is terminated or revoked.  Performed at [facility], [address], [place], [place] [address]          Radiology Studies: CT Angio Chest PE W and/or Wo Contrast  Result Date: 09/13/2022 CLINICAL DATA:  Fever, shortness of breath and congestion. EXAM: CT ANGIOGRAPHY CHEST WITH CONTRAST TECHNIQUE: Multidetector CT imaging of the chest was performed using the standard protocol during bolus administration of intravenous contrast. Multiplanar CT image reconstructions and MIPs were obtained to evaluate the vascular anatomy. RADIATION DOSE REDUCTION: This exam was performed according to the departmental dose-optimization program which includes automated exposure control, adjustment of the mA and/or kV according to patient  size and/or use of iterative reconstruction technique. CONTRAST:  OMNIPAQUE IOHEXOL 350 MG/ML SOLN COMPARISON:  Chest x-ray 09/13/2022 earlier. Older x-rays as well. No prior chest CT FINDINGS: Cardiovascular: Enlarged heart particularly the right atrium. Trace pericardial fluid. The thoracic aorta has a normal course and caliber with scattered vascular calcifications. Coronary artery calcifications are seen there is breathing motion seen throughout the examination. This limits evaluation for pulmonary emboli, nondiagnostic for small and peripheral emboli. No segmental or larger pulmonary embolism identified. There is some enlargement of the central pulmonary arteries. Please correlate for any evidence of pulmonary artery hypertension. Mediastinum/Nodes: No specific abnormal lymph node enlargement identified in the axillary regions. Enlarged right hilar lymph node identified on series 4, image 66 measuring 2.9 by 1.8 cm. There are some small mediastinal nodes as well. This includes a right-sided precarinal node measuring 16 x 29 mm. A few other smaller nodes elsewhere. Patulous fluid-filled esophagus. Lungs/Pleura: Breathing motion. Area of consolidative opacity seen at the right lung base. Mild  more bandlike opacity left lung base. Tiny pleural effusions. Apical pleural thickening. Centrilobular paraseptal lung changes identified. Air cysts in the right lower lobe measuring 2.7 cm. This may have some luminal fluid Upper Abdomen: Adrenal glands are preserved in the upper abdomen. Previous cholecystectomy. Musculoskeletal: Moderate degenerative changes along the spine. Scattered degenerative changes. Review of the MIP images confirms the above findings. IMPRESSION: Breathing motion. No segmental or larger pulmonary embolism. There is some enlargement of the pulmonary arteries. Please correlate for evidence of pulmonary artery hypertension. Enlarged heart particularly the right atrium. Consolidative opacity in  the right lung base. More bandlike opacity left lung base. Tiny effusions. Infiltrative process is possible. Recommend follow-up. Few enlarged right hilar and mediastinal lymph nodes. These also can be assessed on follow-up. Aortic Atherosclerosis (ICD10-I70.0) and Emphysema (ICD10-J43.9). Electronically Signed   By: Karen Kays M.D.   On: 09/13/2022 17:49   DG Chest Port 1 View  Result Date: 09/13/2022 CLINICAL DATA:  Questionable sepsis.  Shortness of breath EXAM: PORTABLE CHEST 1 VIEW COMPARISON:  Chest x-ray 11/09/2021 FINDINGS: Enlarged cardiopericardial silhouette. Interstitial changes once again identified. No consolidation, pneumothorax or effusion. Slight elevation of the left hemidiaphragm. Slight left basilar atelectasis. Overlapping cardiac leads. Degenerative changes of the spine and shoulders. IMPRESSION: Enlarged cardiopericardial silhouette. Slight elevation left hemidiaphragm. Slight left basilar atelectasis. Extensive interstitial changes, similar to previous. Please correlate with the history. Electronically Signed   By: Karen Kays M.D.   On: 09/13/2022 15:00        Scheduled Meds:  apixaban  2.5 mg Oral BID   sodium chloride flush  3 mL Intravenous Q12H   Continuous Infusions:  cefTRIAXone (ROCEPHIN)  IV Stopped (09/13/22 2205)   doxycycline (VIBRAMYCIN) IV     metronidazole Stopped (09/14/22 0100)     LOS: 1 day    Time spent: 35 minutes    Taiki Buckwalter Hoover Brunette, DO [facility]  If 7PM-7AM, please contact night-coverage [email or web address] 09/14/2022, 7:56 AM

## 2022-09-14 NOTE — Progress Notes (Signed)
CARDIOLOGY CONSULT NOTE  Patient ID: Jordan Hoffman MRN: [id number] DOB/AGE: [birth year] 89 y.o.  Admit date: 09/13/2022 Attending physician: Erick Blinks, DO Primary Physician:  [facility] (Dr. Regino Schultze per patient) [facility]  Inpatient Cardiologist: Tessa Lerner, DO, [facility]  Reason of consultation: Elevated troponin  Referring physician: Erick Blinks, DO  Chief complaint: fevers   HPI:  Jordan Hoffman is a 89 y.o. African-American male who presents with a chief complaint of "fevers." His past medical history and cardiovascular risk factors include:  Persistent atrial fibrillation (last DCCV 12/16/2015), HFrEF, COPD, history of syncope, nonobstructive CAD, type 2 diabetes, hypertension, OSA on CPAP.  Patient presents to the hospital with a chief complaint of fevers, chills, productive cough.  During his workup in the ER they checked high sensitive troponins routinely which came back elevated and therefore cardiology was consulted for further assistance.  He denies anginal chest pain but he does have shortness of breath at rest and also with exertion which is chronic and stable.  The intensity frequency and duration of dyspnea has not changed.  He also denies orthopnea, PND, lower extremity swelling.  His wife is present at bedside and his grandson who is a Teacher, early years/pre was present over the phone.  Both confirm that he has not been experiencing chest pain in the recent past leading up to hospitalization.  In fact he may have had an ischemic evaluation as outpatient after his last hospitalization in September 2023.  ALLERGIES: Allergies  Allergen Reactions   Lisinopril Cough   Penicillins Hives, Rash and Other (See Comments)    Patient and his wife reported he broke out in bad rash. Last exposed to penicillins over 21yrs ago. Family declined rechallenging pencillins on 09/14/22.    Sulfa Antibiotics Rash and Other (See Comments)   Pravastatin Hives    Atorvastatin Other (See Comments)    Muscle pain   Codeine Hives   Flunisolide Hives and Other (See Comments)    Epistaxis, bleeding from nose   Loratadine Hives   Niacin Hives and Other (See Comments)    Flushing   Niacin And Related     Hives    Simvastatin Hives    weakness Other reaction(s): Weakness present   Statins Hives and Other (See Comments)    Weakness   Ciprofloxacin Rash   Erythromycin Rash   Ranitidine Rash    PAST MEDICAL HISTORY: Past Medical History:  Diagnosis Date   Atrial fibrillation (HCC)    Back pain    COPD (chronic obstructive pulmonary disease) (HCC)    Diabetes mellitus without complication (HCC)    High cholesterol    Hypertension    Knee pain     PAST SURGICAL HISTORY: Past Surgical History:  Procedure Laterality Date   BACK SURGERY     HIP SURGERY     JOINT REPLACEMENT Left    left hip replacement   MOUTH SURGERY     REPLACEMENT TOTAL KNEE Right    TRANSURETHRAL RESECTION OF PROSTATE      FAMILY HISTORY: The patient's family history includes Kidney disease in his brother; Lung cancer in his brother, father, and mother; Stroke in his sister.   SOCIAL HISTORY:  The patient  reports that he has quit smoking. He has never used smokeless tobacco. He reports that he does not drink alcohol and does not use drugs.  MEDICATIONS: Current Outpatient Medications  Medication Instructions   acetaminophen (TYLENOL) 325-650 mg, Oral, Every 8 hours  albuterol (PROVENTIL) 2.5 mg, Nebulization, Every 4 hours PRN   albuterol (VENTOLIN HFA) 108 (90 Base) MCG/ACT inhaler 2 puffs, Inhalation, Every 4 hours PRN   ALVESCO 160 MCG/ACT inhaler 1 puff, Inhalation, 2 times daily   apixaban (ELIQUIS) 2.5 mg, Oral, 2 times daily   calcium carbonate (TUMS - DOSED IN MG ELEMENTAL CALCIUM) 500 MG chewable tablet 1 tablet, Oral, 2 times daily   carboxymethylcellulose (REFRESH PLUS) 0.5 % SOLN 2 drops, Both Eyes, Nightly   cholecalciferol (VITAMIN D3) 1,000  Units, Oral, Daily   diclofenac Sodium (VOLTAREN) 2 g, Topical, 4 times daily PRN   docusate sodium (COLACE) 100 mg, Oral, 2 times daily PRN   fluticasone (FLONASE) 50 MCG/ACT nasal spray 2 sprays, Each Nare, Daily PRN   furosemide (LASIX) 20 mg, Oral, Daily   gabapentin (NEURONTIN) 100 mg, Oral, See admin instructions, Take 100mg  by mouth twice a day and may take an extra 100mg  midday if needed.    guaiFENesin (ROBITUSSIN) 100 MG/5ML liquid 5 mLs, Oral, Every 4 hours PRN   hydrALAZINE (APRESOLINE) 25 mg, Oral, Every 8 hours   isosorbide mononitrate (IMDUR) 30 mg, Oral, Daily   latanoprost (XALATAN) 0.005 % ophthalmic solution 1 drop, Both Eyes, Daily at bedtime   loratadine (CLARITIN) 10 mg, Oral, Daily   metoprolol succinate (TOPROL-XL) 25 mg, Oral, Daily, **Hold for HR >60   tamsulosin (FLOMAX) 0.4 mg, Oral, Every evening   terbinafine (LAMISIL) 1 % cream 1 Application, Topical, 2 times daily    REVIEW OF SYSTEMS: Review of Systems  Constitutional: Positive for chills, fever and night sweats.  Cardiovascular:  Positive for dyspnea on exertion (Chronic and stable). Negative for chest pain, claudication, irregular heartbeat, leg swelling, near-syncope, orthopnea, palpitations, paroxysmal nocturnal dyspnea and syncope.  Respiratory:  Positive for cough, shortness of breath (Chronic and stable) and sputum production.   Hematologic/Lymphatic: Negative for bleeding problem.  Musculoskeletal:  Negative for muscle cramps and myalgias.  Neurological:  Negative for dizziness and light-headedness.  All other systems reviewed and are negative.   PHYSICAL EXAMINATION: PHYSICAL EXAM: Temp:  [98.3 F (36.8 C)-99.6 F (37.6 C)] 99.6 F (37.6 C) (08/29 1558) Pulse Rate:  [62-98] 81 (08/29 1558) Resp:  [14-29] 18 (08/29 1100) BP: (109-141)/(60-97) 137/72 (08/29 1558) SpO2:  [94 %-100 %] 97 % (08/29 1558)  Intake/Output:  Intake/Output Summary (Last 24 hours) at 09/14/2022 1930 Last data  filed at 09/14/2022 1927 Gross per 24 hour  Intake 1181.07 ml  Output --  Net 1181.07 ml     Net IO Since Admission: 1,381.07 mL [09/14/22 1930]  Weights:     09/13/2022    1:30 PM 11/10/2021    1:01 PM 11/09/2021    7:00 PM  Last 3 Weights  Weight (lbs) 178 lb 9.2 oz 179 lb 7.3 oz 173 lb  Weight (kg) 81 kg 81.4 kg 78.472 kg     Physical Exam  Constitutional: No distress. He appears chronically ill.  hemodynamically stable.   Neck: No JVD present.  Cardiovascular: Normal rate, S1 normal and S2 normal. An irregularly irregular rhythm present. Exam reveals no gallop, no S3 and no S4.  No murmur heard. Pulmonary/Chest: Effort normal and breath sounds normal. No stridor. He has no wheezes. He has no rales.  Abdominal: Soft. Bowel sounds are normal. He exhibits no distension. There is no abdominal tenderness.  Musculoskeletal:        General: No edema.     Cervical back: Neck supple.  Neurological: He is alert  and oriented to person, place, and time. He has intact cranial nerves (2-12).  Skin: Skin is warm and moist.    LAB RESULTS: Chemistry Recent Labs  Lab 09/13/22 1435 09/14/22 0500  NA 145 135  K 2.2* 4.3  CL 127* 107  CO2 14* 21*  GLUCOSE 78 118*  BUN 25* 39*  CREATININE 0.86 1.56*  CALCIUM 5.3* 8.9  PROT 3.6* 6.3*  ALBUMIN 1.9* 3.1*  AST 12* 22  ALT 7 12  ALKPHOS 43 74  BILITOT 0.7 1.3*  GFRNONAA >60 41*  ANIONGAP 4* 7    Hematology Recent Labs  Lab 09/13/22 1435 09/14/22 0500  WBC 10.2 14.0*  RBC 3.71* 3.44*  HGB 10.3* 9.6*  HCT 33.2* 30.9*  MCV 89.5 89.8  MCH 27.8 27.9  MCHC 31.0 31.1  RDW 16.5* 16.6*  PLT 178 151   High Sensitivity Troponin:   Recent Labs  Lab 09/14/22 0500 09/14/22 0700 09/14/22 1228 09/14/22 1407  TROPONINIHS 1,623* 1,484* 1,123* 1,153*     Cardiac EnzymesNo results for input(s): "TROPONINI" in the last 168 hours. No results for input(s): "TROPIPOC" in the last 168 hours.  BNP Recent Labs  Lab 09/14/22 0500   BNP 519.0*    DDimer No results for input(s): "DDIMER" in the last 168 hours.  Hemoglobin A1c:  Lab Results  Component Value Date   HGBA1C 7.0 (H) 11/10/2021   MPG 154.2 11/10/2021   TSH  Recent Labs    09/26/21 0729  TSH 0.513   Lipid Panel No results found for: "CHOL", "HDL", "LDLCALC", "LDLDIRECT", "TRIG", "CHOLHDL" Drugs of Abuse  No results found for: "LABOPIA", "COCAINSCRNUR", "LABBENZ", "AMPHETMU", "THCU", "LABBARB"    RADIOLOGY: CARE EVERYWHERE Chest x-ray 08/26/2021: Mild to moderate pulmonary edema with trace effusions and bibasilar atelectasis. Unchanged cardiomegaly.   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 No evidence of new infarction or hemorrhage. No significant change compared to prior same day examination.   Carotid duplex 08/30/2021 [facility]: Right Findings No significant atherosclerosis or stenosis of the right common carotid artery. The Doppler flow velocities within the right external carotid artery are within normal limits. 1-39% stenosis of the right internal carotid artery/bifurcation. Plaque is calcified . Antegrade flow is noted in the right vertebral artery.   Left Findings No significant atherosclerosis or stenosis of the left common carotid artery. The Doppler flow velocities within the left external carotid artery are within normal limits. 1-39% stenosis of the left internal carotid artery. Plaque is calcified . Antegrade flow is noted in the left vertebral artery.   MRI head without contrast 08/29/2021: Motion degraded.   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   Chronic microvascular ischemic changes.   No proximal intracranial vessel occlusion.   CARDIAC DATABASE: PER CARE EVERYWHERE:  Echo 08/28/2021 Per report: LVEF 30-35%, severe global hypokinesis, mild LAE,  moderate RAE, normal right ventricular size and function, moderate MR, mild to moderate TR, IVC severely dilated, no pericardial effusion   PYP study 05/06/2021: H/CL ratio of 1.3. Planar Grade 1. SPECT Grade 2. Findings are overall equivocal for TTR amyloidosis.    ECHO: 01/19/2020 Interpretation Summary Compared to prior study, there is no significant change.  Normal LV size. Moderate LVH. LVEF>55%. Normal RV size and function. Mild RA enlargement. Mild TR. Mild MR.The estimated RVSP= Trivial PR. No AR. Borderline aortic root enlargement. No pericardial effusion.  Echocardiogram May  2024 at [facility] available in Care Everywhere: LVEF 35-40%, mild MR.  No pericardial effusion   Cardiac MRI 11/09/2020: 1. Normal-sized left ventricle with severe left ventricular systolic dysfunction with a calculated ejection fraction of 29%.    2. Mild left ventricular hypertrophy with areas of mid myocardial delayed enhancement as described above which could be related to sequelae of prior myocarditis or sarcoidosis.    3. Mild to moderately dilated right ventricle with normal right ventricular systolic function.    4. Mildly dilated left atrium and moderately dilated right atrium.    5. Mild tricuspid regurgitation.  Cardiac Cath (08/18/15) 1. Diffuse dense calcific plaque ascending aorta without aneurysmal changes  or dissection  2. Right dominant coronary system with non-obstructive 40% proximal LAD,  otherwise normal coronaries   Exercise Stress Test (07/2015) 1. Positive study (1.32mm) during Stage II Bruce protocol. No chronotropic  incompetence. 2. Normal heart rate and BP response to exercise. Of note, in early recovery,  pt developed narrow complex tachycardia consistent with SVT (16 beats, HR  170s) that was asymptomatic. 3. Exercise terminated due to fatigue. 4. Study done to assess for chronotropic incompetence.   CARDIAC DATABASE: EKG: 09/13/2022: Atrial  fibrillation, 102 bpm, left anterior fascicular block, inferior lateral ST depressions, without underlying injury pattern.  Similar findings on tracing 09/25/2021.  09/14/2022: Atrial fibrillation, 83 bpm, left anterior fascicular block, nonspecific ST-T changes.  Echocardiogram: Pending  Scheduled Meds:  calcium carbonate  1 tablet Oral BID   furosemide  20 mg Oral Daily   hydrALAZINE  25 mg Oral Q8H   isosorbide mononitrate  30 mg Oral Daily   latanoprost  1 drop Both Eyes QHS   loratadine  10 mg Oral Daily   metoprolol succinate  25 mg Oral Daily   sodium chloride flush  3 mL Intravenous Q12H   tamsulosin  0.4 mg Oral QPM    Continuous Infusions:  sodium chloride 10 mL/hr at 09/14/22 1800   cefTRIAXone (ROCEPHIN)  IV Stopped (09/13/22 2205)   doxycycline (VIBRAMYCIN) IV Stopped (09/14/22 1320)   heparin     metronidazole Stopped (09/14/22 1029)    PRN Meds: sodium chloride, acetaminophen **OR** acetaminophen, albuterol, gabapentin, guaiFENesin, mouth rinse, polyethylene glycol  IMPRESSION & RECOMMENDATIONS: Jordan Hoffman is a 89 y.o. African-American male whose past medical history and cardiovascular risk factors include: Persistent atrial fibrillation (last DCCV 12/16/2015), HFrEF, COPD, history of syncope, nonobstructive CAD, type 2 diabetes, hypertension, OSA on CPAP.  Impression:  NSTEMI likely secondary to supply demand ischemia Acute febrile illness Community-acquired pneumonia Acute kidney injury Atrial fibrillation, persistent Chronic HFrEF, stage C, NYHA class II. Cardiomyopathy. Nonobstructive CAD Diabetes mellitus type 2 Hypertension  Plan:  Patient presents today for acute febrile illness with subjective fevers, chills, productive cough currently being treated for community-acquired pneumonia.  Despite being asymptomatic high sensitive troponins were checked and incidentally noted to be elevated.  Initial high sensitive troponin 1623 which was also the peak.   Troponins are trending down.  Clinically denies anginal chest pain and he is not in overt heart failure.  I suspect the troponin leak is secondary to demand ischemia in the setting of acute febrile illness in the setting of  underlying cardiomyopathy, HFrEF, A-fib, nonobstructive disease, and acute kidney injury.  Given the degree of the troponin leak and EKG changes would recommend treating him medically for NSTEMI.  Will start IV heparin drip for 48 hours per ACS protocol.  Plan echocardiogram to reevaluate LVEF and to see  if he has concerning regional wall motion abnormalities.  Recommend restarting his home cardiac medications. Hold eliquis as he is on IV heparin.   Continue telemetry.  Since he is asymptomatic and troponins have trended down would recommend holding repeat troponin checks unless there is change in clinical status.  He is very well-established with a cardiologist at the [place] according to the patient, wife, and grandson who is a Teacher, early years/pre.  Will try to obtain records to see when his last ischemic evaluation was.  However, recommended outpatient cardiology follow-up postdischarge for reevaluation.  Further recommendations to follow.   Total encounter time 66 minutes. *Total Encounter Time as defined by the Centers for Medicare and Medicaid Services includes, in addition to the face-to-face time of a patient visit (documented in the note above) non-face-to-face time: obtaining and reviewing outside history, ordering and reviewing medications, tests or procedures, care coordination (communications with other health care professionals or caregivers) and documentation in the medical record.  Patient's questions and concerns were addressed to his satisfaction. He voices understanding of the instructions provided during this encounter.   This note was created using a voice recognition software as a result there may be grammatical errors inadvertently enclosed that do not reflect  the nature of this encounter. Every attempt is made to correct such errors.  Delilah Shan [facility]  Pager:  [phone] Office: [phone] 09/14/2022, 7:30 PM

## 2022-09-14 NOTE — ED Notes (Signed)
Patient given breakfast tray.

## 2022-09-14 NOTE — ED Notes (Signed)
ED TO INPATIENT HANDOFF REPORT  Name/Age/Gender Jordan Hoffman 89 y.o. male  Code Status    Code Status Orders  (From admission, onward)           Start     Ordered   09/13/22 2037  Full code  Continuous       Question:  By:  Answer:  Consent: discussion documented in EHR   09/13/22 2037           Code Status History     Date Active Date Inactive Code Status Order ID Comments User Context   11/09/2021 2210 11/11/2021 1912 DNR [id number]  Hillary Bow, DO ED   10/17/2021 0729 10/18/2021 1556 DNR [id number]  Zannie Cove, MD Inpatient   10/14/2021 1553 10/17/2021 0728 Full Code [id number]  Orland Mustard, MD Inpatient   09/25/2021 1350 09/25/2021 1355 Full Code [id number]  Merlene Laughter, DO ED   09/25/2021 1343 09/25/2021 1350 Full Code [id number]  Jeanella Craze, NP ED   07/28/2020 1609 07/31/2020 1630 Full Code [id number]  Marlow Baars, MD ED   04/09/2020 0223 04/10/2020 2143 Full Code [id number]  Chotiner, Claudean Severance, MD Inpatient   12/11/2015 1706 12/12/2015 1537 Full Code [id number]  Fuller Plan, MD Inpatient       Home/SNF/Other Home  Chief Complaint Community acquired pneumonia [J18.9]  Level of Care/Admitting Diagnosis ED Disposition     ED Disposition  Admit   Condition  --   Comment  Hospital Area: [facility] [[id number]]  Level of Care: Telemetry [5]  Admit to tele based on following criteria: Other see comments  Comments: hypokalemia  May admit patient to [facility] or [facility] if equivalent level of care is available:: No  Covid Evaluation: Confirmed COVID Negative  Diagnosis: Community acquired pneumonia [706237]  Admitting Physician: Nolberto Hanlon [[id number]]  Attending Physician: Nolberto Hanlon [[id number]]  Certification:: I certify this patient will need inpatient services for at least 2 midnights          Medical History Past Medical History:  Diagnosis Date   Atrial fibrillation (HCC)    Back pain    COPD  (chronic obstructive pulmonary disease) (HCC)    Diabetes mellitus without complication (HCC)    High cholesterol    Hypertension    Knee pain     Allergies Allergies  Allergen Reactions   Lisinopril Cough    Other Reaction(s): Other (See Comments)  Cough , Cough   Penicillins Other (See Comments), Hives and Itching    Did it involve swelling of the face/tongue/throat, SOB, or low BP? Yes  Did it involve sudden or severe rash/hives, skin peeling, or any reaction on the inside of your mouth or nose? No  Did you need to seek medical attention at a hospital or doctor's office? No  When did it last happen? Unk   If all above answers are "NO", may proceed with cephalosporin use.     *pt tolerated ancef on 08/08/16  Has patient had a PCN reaction causing immediate rash, facial/tongue/throat swelling, SOB or lightheadedness with hypotension: YES, Has patient had a PCN reaction causing severe rash involving mucus membranes or skin necrosis: NO, Has patient had a PCN reaction that required hospitalization NO, Has patient had a PCN reaction occurring within the last 10 years: NO, If all of the above answers are "NO", then may proceed with Cephalosporin use., Has patient had a PCN reaction causing immediate rash, facial   Sulfa Antibiotics Itching and Other (  See Comments)    Itching, watery eyes  Other Reaction(s): Other (See Comments)  Hives , Other Reaction: Other reaction   Pravastatin Hives    Other Reaction(s): Other (See Comments)  Hives , Hives   Atorvastatin Other (See Comments)    Muscle pain  Other Reaction(s): Other (See Comments)  Muscle pain , Muscle pain   Codeine Hives    Other Reaction(s): Other (See Comments)  Hives , Hives   Flunisolide Hives    epistaxis  Other reaction(s): Bleeding from nose, Other (See Comments)  Hives   Hives  Other Reaction(s): Other (See Comments)  Hives , Hives   Loratadine     Hives   Other reaction(s): Other (See  Comments), Other (See Comments)  Hives  Other Reaction(s): Other (See Comments)  Hives , Hives   Niacin Hives and Other (See Comments)    Flushing, also  Other Reaction(s): Other (See Comments)  Hives   Niacin And Related     Hives    Simvastatin Hives    weakness Other reaction(s): Weakness present   Statins Hives    weakness  Other reaction(s): Other (See Comments), Unknown  Hives   Hives  Other Reaction(s): Other (See Comments)  Hives , Hives , Hives   Ciprofloxacin Rash    Hives   Other reaction(s): Other (See Comments), Other (See Comments)  Hives  Other Reaction(s): Other (See Comments)  Hives , Hives   Erythromycin Rash    Hives   Other reaction(s): Other (See Comments), Other (See Comments)  Hives  Other Reaction(s): Other (See Comments)  Hives , Hives   Ranitidine Rash    Hives   Other reaction(s): Other (See Comments), Other (See Comments)  Hives  Other Reaction(s): Other (See Comments)  Hives , Hives    IV Location/Drains/Wounds Patient Lines/Drains/Airways Status     Active Line/Drains/Airways     Name Placement date Placement time Site Days   Peripheral IV 09/13/22 18 G Anterior;Distal;Left;Upper Arm 09/13/22  1325  Arm  1   Peripheral IV 09/13/22 20 G Posterior;Proximal;Right Forearm 09/13/22  1755  Forearm  1   External Urinary Catheter 11/10/21  1850  --  308   Incision (Closed) 09/25/21 Umbilicus Medial;Lower 09/25/21  1710  -- 354            Labs/Imaging Results for orders placed or performed during the hospital encounter of 09/13/22 (from the past 48 hour(s))  Resp panel by RT-PCR (RSV, Flu A&B, Covid) Anterior Nasal Swab     Status: None   Collection Time: 09/13/22  1:39 PM   Specimen: Anterior Nasal Swab  Result Value Ref Range   SARS Coronavirus 2 by RT PCR NEGATIVE NEGATIVE    Comment: (NOTE) SARS-CoV-2 target nucleic acids are NOT DETECTED.  The SARS-CoV-2 RNA is generally detectable in upper  respiratory specimens during the acute phase of infection. The lowest concentration of SARS-CoV-2 viral copies this assay can detect is 138 copies/mL. A negative result does not preclude SARS-Cov-2 infection and should not be used as the sole basis for treatment or other patient management decisions. A negative result may occur with  improper specimen collection/handling, submission of specimen other than nasopharyngeal swab, presence of viral mutation(s) within the areas targeted by this assay, and inadequate number of viral copies(<138 copies/mL). A negative result must be combined with clinical observations, patient history, and epidemiological information. The expected result is Negative.  Fact Sheet for Patients:  BloggerCourse.com  Fact Sheet for Healthcare Providers:  SeriousBroker.it  This test is no t yet approved or cleared by the [place] and  has been authorized for detection and/or diagnosis of SARS-CoV-2 by FDA under an Emergency Use Authorization (EUA). This EUA will remain  in effect (meaning this test can be used) for the duration of the COVID-19 declaration under Section 564(b)(1) of the Act, 21 U.S.C.section 360bbb-3(b)(1), unless the authorization is terminated  or revoked sooner.       Influenza A by PCR NEGATIVE NEGATIVE   Influenza B by PCR NEGATIVE NEGATIVE    Comment: (NOTE) The Xpert Xpress SARS-CoV-2/FLU/RSV plus assay is intended as an aid in the diagnosis of influenza from Nasopharyngeal swab specimens and should not be used as a sole basis for treatment. Nasal washings and aspirates are unacceptable for Xpert Xpress SARS-CoV-2/FLU/RSV testing.  Fact Sheet for Patients: BloggerCourse.com  Fact Sheet for Healthcare Providers: SeriousBroker.it  This test is not yet approved or cleared by the [place] FDA and has been authorized for  detection and/or diagnosis of SARS-CoV-2 by FDA under an Emergency Use Authorization (EUA). This EUA will remain in effect (meaning this test can be used) for the duration of the COVID-19 declaration under Section 564(b)(1) of the Act, 21 U.S.C. section 360bbb-3(b)(1), unless the authorization is terminated or revoked.     Resp Syncytial Virus by PCR NEGATIVE NEGATIVE    Comment: (NOTE) Fact Sheet for Patients: BloggerCourse.com  Fact Sheet for Healthcare Providers: SeriousBroker.it  This test is not yet approved or cleared by the [place] FDA and has been authorized for detection and/or diagnosis of SARS-CoV-2 by FDA under an Emergency Use Authorization (EUA). This EUA will remain in effect (meaning this test can be used) for the duration of the COVID-19 declaration under Section 564(b)(1) of the Act, 21 U.S.C. section 360bbb-3(b)(1), unless the authorization is terminated or revoked.  Performed at [facility], [address] [place], [place] [address]   Comprehensive metabolic panel     Status: Abnormal   Collection Time: 09/13/22  2:35 PM  Result Value Ref Range   Sodium 145 135 - 145 mmol/L   Potassium 2.2 (LL) 3.5 - 5.1 mmol/L    Comment: CRITICAL RESULT CALLED TO, READ BACK BY AND VERIFIED WITH I.CORTES, RN AT 1650 ON 08.28.24 BY N.THOMPSON    Chloride 127 (H) 98 - 111 mmol/L   CO2 14 (L) 22 - 32 mmol/L   Glucose, Bld 78 70 - 99 mg/dL    Comment: Glucose reference range applies only to samples taken after fasting for at least 8 hours.   BUN 25 (H) 8 - 23 mg/dL   Creatinine, Ser 9.14 0.61 - 1.24 mg/dL   Calcium 5.3 (LL) 8.9 - 10.3 mg/dL    Comment: CRITICAL RESULT CALLED TO, READ BACK BY AND VERIFIED WITH I.CORTES, RN AT 1650 ON 08.28.24 BY N.THOMPSON    Total Protein 3.6 (L) 6.5 - 8.1 g/dL   Albumin 1.9 (L) 3.5 - 5.0 g/dL   AST 12 (L) 15 - 41 U/L   ALT 7 0 - 44 U/L   Alkaline Phosphatase  43 38 - 126 U/L   Total Bilirubin 0.7 0.3 - 1.2 mg/dL   GFR, Estimated >78 >29 mL/min    Comment: (NOTE) Calculated using the CKD-EPI Creatinine Equation (2021)    Anion gap 4 (L) 5 - 15    Comment: Performed at [facility], [address] [place], [place] [address]  CBC with Differential     Status:  Abnormal   Collection Time: 09/13/22  2:35 PM  Result Value Ref Range   WBC 10.2 4.0 - 10.5 K/uL   RBC 3.71 (L) 4.22 - 5.81 MIL/uL   Hemoglobin 10.3 (L) 13.0 - 17.0 g/dL   HCT 16.1 (L) 09.6 - 04.5 %   MCV 89.5 80.0 - 100.0 fL   MCH 27.8 26.0 - 34.0 pg   MCHC 31.0 30.0 - 36.0 g/dL   RDW 40.9 (H) 81.1 - 91.4 %   Platelets 178 150 - 400 K/uL   nRBC 0.0 0.0 - 0.2 %   Neutrophils Relative % 89 %   Neutro Abs 9.1 (H) 1.7 - 7.7 K/uL   Lymphocytes Relative 5 %   Lymphs Abs 0.5 (L) 0.7 - 4.0 K/uL   Monocytes Relative 6 %   Monocytes Absolute 0.6 0.1 - 1.0 K/uL   Eosinophils Relative 0 %   Eosinophils Absolute 0.0 0.0 - 0.5 K/uL   Basophils Relative 0 %   Basophils Absolute 0.0 0.0 - 0.1 K/uL   Immature Granulocytes 0 %   Abs Immature Granulocytes 0.03 0.00 - 0.07 K/uL    Comment: Performed at [facility], [address], [place], [place] [address]  Protime-INR     Status: Abnormal   Collection Time: 09/13/22  2:35 PM  Result Value Ref Range   Prothrombin Time 17.3 (H) 11.4 - 15.2 seconds   INR 1.4 (H) 0.8 - 1.2    Comment: (NOTE) INR goal varies based on device and disease states. Performed at [facility], [address], [place], [place] [address]   APTT     Status: None   Collection Time: 09/13/22  2:35 PM  Result Value Ref Range   aPTT 34 24 - 36 seconds    Comment: Performed at [facility], [address] [place], [place] [address]  I-Stat Lactic Acid, ED     Status: None   Collection Time: 09/13/22  2:46 PM  Result Value Ref Range   Lactic Acid, Venous 1.1 0.5 - 1.9 mmol/L  I-Stat Lactic  Acid, ED     Status: None   Collection Time: 09/13/22  6:22 PM  Result Value Ref Range   Lactic Acid, Venous 1.4 0.5 - 1.9 mmol/L  Urinalysis, w/ Reflex to Culture (Infection Suspected) -Urine, Clean Catch     Status: Abnormal   Collection Time: 09/13/22  8:29 PM  Result Value Ref Range   Specimen Source URINE, CLEAN CATCH    Color, Urine YELLOW YELLOW   APPearance HAZY (A) CLEAR   Specific Gravity, Urine 1.023 1.005 - 1.030   pH 5.0 5.0 - 8.0   Glucose, UA NEGATIVE NEGATIVE mg/dL   Hgb urine dipstick NEGATIVE NEGATIVE   Bilirubin Urine NEGATIVE NEGATIVE   Ketones, ur NEGATIVE NEGATIVE mg/dL   Protein, ur NEGATIVE NEGATIVE mg/dL   Nitrite POSITIVE (A) NEGATIVE   Leukocytes,Ua LARGE (A) NEGATIVE   RBC / HPF 0-5 0 - 5 RBC/hpf   WBC, UA >50 0 - 5 WBC/hpf    Comment:        Reflex urine culture not performed if WBC <=10, OR if Squamous epithelial cells >5. If Squamous epithelial cells >5 suggest recollection.    Bacteria, UA FEW (A) NONE SEEN   Squamous Epithelial / HPF 0-5 0 - 5 /HPF   Mucus PRESENT    Budding Yeast PRESENT     Comment: Performed at [facility], [address] [place],  [place] [address]  APTT     Status: Abnormal   Collection Time: 09/14/22  5:00 AM  Result Value Ref Range   aPTT 38 (H) 24 - 36 seconds    Comment:        IF BASELINE aPTT IS ELEVATED, SUGGEST PATIENT RISK ASSESSMENT BE USED TO DETERMINE APPROPRIATE ANTICOAGULANT THERAPY. Performed at [facility], [address] [place], [place] [address]   Protime-INR     Status: Abnormal   Collection Time: 09/14/22  5:00 AM  Result Value Ref Range   Prothrombin Time 18.5 (H) 11.4 - 15.2 seconds   INR 1.5 (H) 0.8 - 1.2    Comment: (NOTE) INR goal varies based on device and disease states. Performed at [facility], [address], [place], [place] [address]   CBC     Status: Abnormal   Collection Time: 09/14/22  5:00 AM  Result Value Ref  Range   WBC 14.0 (H) 4.0 - 10.5 K/uL   RBC 3.44 (L) 4.22 - 5.81 MIL/uL   Hemoglobin 9.6 (L) 13.0 - 17.0 g/dL   HCT 69.6 (L) 29.5 - 28.4 %   MCV 89.8 80.0 - 100.0 fL   MCH 27.9 26.0 - 34.0 pg   MCHC 31.1 30.0 - 36.0 g/dL   RDW 13.2 (H) 44.0 - 10.2 %   Platelets 151 150 - 400 K/uL   nRBC 0.0 0.0 - 0.2 %    Comment: Performed at [facility], [address] [place], [place] [address]  Comprehensive metabolic panel     Status: Abnormal   Collection Time: 09/14/22  5:00 AM  Result Value Ref Range   Sodium 135 135 - 145 mmol/L    Comment: DELTA CHECK NOTED   Potassium 4.3 3.5 - 5.1 mmol/L    Comment: DELTA CHECK NOTED   Chloride 107 98 - 111 mmol/L   CO2 21 (L) 22 - 32 mmol/L   Glucose, Bld 118 (H) 70 - 99 mg/dL    Comment: Glucose reference range applies only to samples taken after fasting for at least 8 hours.   BUN 39 (H) 8 - 23 mg/dL   Creatinine, Ser 6.44 (H) 0.61 - 1.24 mg/dL   Calcium 8.9 8.9 - 03.4 mg/dL    Comment: DELTA CHECK NOTED   Total Protein 6.3 (L) 6.5 - 8.1 g/dL   Albumin 3.1 (L) 3.5 - 5.0 g/dL   AST 22 15 - 41 U/L   ALT 12 0 - 44 U/L   Alkaline Phosphatase 74 38 - 126 U/L   Total Bilirubin 1.3 (H) 0.3 - 1.2 mg/dL   GFR, Estimated 41 (L) >60 mL/min    Comment: (NOTE) Calculated using the CKD-EPI Creatinine Equation (2021)    Anion gap 7 5 - 15    Comment: Performed at [facility], [address] [place], [place] [address]  Magnesium     Status: None   Collection Time: 09/14/22  5:00 AM  Result Value Ref Range   Magnesium 2.0 1.7 - 2.4 mg/dL    Comment: Performed at [facility], [address], [place], [place] [address]  Troponin I (High Sensitivity)     Status: Abnormal   Collection Time: 09/14/22  5:00 AM  Result Value Ref Range   Troponin I (High Sensitivity) 1,623 (HH) <18 ng/L    Comment: CRITICAL RESULT CALLED TO, READ BACK BY AND VERIFIED WITH Gillian Shields RN @ [phone] ON 09/14/2022 BY  ABDULHALIM,M REPEATED TO VERIFY (NOTE) Elevated high sensitivity troponin I (hsTnI) values and significant  changes across serial measurements may suggest ACS but many other  chronic and acute conditions are known to elevate hsTnI results.  Refer to the "Links" section for chest pain algorithms and additional  guidance. Performed at [facility], [address], [place], [place] [address]   Troponin I (High Sensitivity)     Status: Abnormal   Collection Time: 09/14/22  7:00 AM  Result Value Ref Range   Troponin I (High Sensitivity) 1,484 (HH) <18 ng/L    Comment: CRITICAL RESULT CALLED TO, READ BACK BY AND VERIFIED WITH Meggan Dhaliwal RN @ [phone] ON 09/14/22 CAL DELTA CHECK NOTED (NOTE) Elevated high sensitivity troponin I (hsTnI) values and significant  changes across serial measurements may suggest ACS but many other  chronic and acute conditions are known to elevate hsTnI results.  Refer to the "Links" section for chest pain algorithms and additional  guidance. Performed at [facility], [address] [place], [place] [address]    CT Angio Chest PE W and/or Wo Contrast  Result Date: 09/13/2022 CLINICAL DATA:  Fever, shortness of breath and congestion. EXAM: CT ANGIOGRAPHY CHEST WITH CONTRAST TECHNIQUE: Multidetector CT imaging of the chest was performed using the standard protocol during bolus administration of intravenous contrast. Multiplanar CT image reconstructions and MIPs were obtained to evaluate the vascular anatomy. RADIATION DOSE REDUCTION: This exam was performed according to the departmental dose-optimization program which includes automated exposure control, adjustment of the mA and/or kV according to patient size and/or use of iterative reconstruction technique. CONTRAST:  OMNIPAQUE IOHEXOL 350 MG/ML SOLN COMPARISON:  Chest x-ray 09/13/2022 earlier. Older x-rays as well. No prior chest CT FINDINGS: Cardiovascular: Enlarged  heart particularly the right atrium. Trace pericardial fluid. The thoracic aorta has a normal course and caliber with scattered vascular calcifications. Coronary artery calcifications are seen there is breathing motion seen throughout the examination. This limits evaluation for pulmonary emboli, nondiagnostic for small and peripheral emboli. No segmental or larger pulmonary embolism identified. There is some enlargement of the central pulmonary arteries. Please correlate for any evidence of pulmonary artery hypertension. Mediastinum/Nodes: No specific abnormal lymph node enlargement identified in the axillary regions. Enlarged right hilar lymph node identified on series 4, image 66 measuring 2.9 by 1.8 cm. There are some small mediastinal nodes as well. This includes a right-sided precarinal node measuring 16 x 29 mm. A few other smaller nodes elsewhere. Patulous fluid-filled esophagus. Lungs/Pleura: Breathing motion. Area of consolidative opacity seen at the right lung base. Mild more bandlike opacity left lung base. Tiny pleural effusions. Apical pleural thickening. Centrilobular paraseptal lung changes identified. Air cysts in the right lower lobe measuring 2.7 cm. This may have some luminal fluid Upper Abdomen: Adrenal glands are preserved in the upper abdomen. Previous cholecystectomy. Musculoskeletal: Moderate degenerative changes along the spine. Scattered degenerative changes. Review of the MIP images confirms the above findings. IMPRESSION: Breathing motion. No segmental or larger pulmonary embolism. There is some enlargement of the pulmonary arteries. Please correlate for evidence of pulmonary artery hypertension. Enlarged heart particularly the right atrium. Consolidative opacity in the right lung base. More bandlike opacity left lung base. Tiny effusions. Infiltrative process is possible. Recommend follow-up. Few enlarged right hilar and mediastinal lymph nodes. These also can be assessed on follow-up.  Aortic Atherosclerosis (ICD10-I70.0) and Emphysema (ICD10-J43.9). Electronically Signed   By: Karen Kays M.D.   On: 09/13/2022 17:49   DG  Chest Port 1 View  Result Date: 09/13/2022 CLINICAL DATA:  Questionable sepsis.  Shortness of breath EXAM: PORTABLE CHEST 1 VIEW COMPARISON:  Chest x-ray 11/09/2021 FINDINGS: Enlarged cardiopericardial silhouette. Interstitial changes once again identified. No consolidation, pneumothorax or effusion. Slight elevation of the left hemidiaphragm. Slight left basilar atelectasis. Overlapping cardiac leads. Degenerative changes of the spine and shoulders. IMPRESSION: Enlarged cardiopericardial silhouette. Slight elevation left hemidiaphragm. Slight left basilar atelectasis. Extensive interstitial changes, similar to previous. Please correlate with the history. Electronically Signed   By: Karen Kays M.D.   On: 09/13/2022 15:00    Pending Labs Unresulted Labs (From admission, onward)     Start     Ordered   09/14/22 0940  Brain natriuretic peptide  Add-on,   AD        09/14/22 0939   09/13/22 2030  Parathyroid hormone, intact (no Ca)  ONCE - URGENT,   URGENT        09/13/22 2029   09/13/22 2029  Calcium, ionized  ONCE - STAT,   STAT        09/13/22 2029   09/13/22 2029  Urine Culture  Once,   R        09/13/22 2029   09/13/22 1347  Blood Culture (routine x 2)  (Undifferentiated presentation (screening labs and basic nursing orders))  BLOOD CULTURE X 2,   STAT      09/13/22 1347            Vitals/Pain Today's Vitals   09/14/22 0543 09/14/22 0700 09/14/22 0800 09/14/22 0900  BP: 109/78 137/78 125/82 123/86  Pulse: 83 80 86 88  Resp: 14 (!) 24 (!) 22 18  Temp: 99.3 F (37.4 C)     TempSrc: Oral     SpO2: 98% 95% 94% 96%  Weight:      Height:      PainSc:        Isolation Precautions No active isolations  Medications Medications  doxycycline (VIBRAMYCIN) 100 mg in sodium chloride 0.9 % 250 mL IVPB (has no administration in time range)   apixaban (ELIQUIS) tablet 2.5 mg (2.5 mg Oral Given 09/14/22 0920)  acetaminophen (TYLENOL) tablet 650 mg (has no administration in time range)    Or  acetaminophen (TYLENOL) suppository 650 mg (has no administration in time range)  polyethylene glycol (MIRALAX / GLYCOLAX) packet 17 g (has no administration in time range)  sodium chloride flush (NS) 0.9 % injection 3 mL (3 mLs Intravenous Given 09/13/22 2149)  cefTRIAXone (ROCEPHIN) 2 g in sodium chloride 0.9 % 100 mL IVPB (0 g Intravenous Stopped 09/13/22 2205)  metroNIDAZOLE (FLAGYL) IVPB 500 mg (500 mg Intravenous New Bag/Given 09/14/22 0922)  acetaminophen (TYLENOL) tablet 650 mg (650 mg Oral Given 09/13/22 1445)  potassium chloride 10 mEq in 100 mL IVPB (0 mEq Intravenous Stopped 09/14/22 0151)  magnesium sulfate IVPB 1 g 100 mL (0 g Intravenous Stopped 09/13/22 1901)  lactated ringers bolus 500 mL (0 mLs Intravenous Stopped 09/13/22 2027)  doxycycline (VIBRAMYCIN) 100 mg in sodium chloride 0.9 % 250 mL IVPB (0 mg Intravenous Stopped 09/13/22 2015)  iohexol (OMNIPAQUE) 350 MG/ML injection 100 mL (100 mLs Intravenous Contrast Given 09/13/22 1716)    Mobility walks with device  Patient A&Ox4, on RA.

## 2022-09-14 NOTE — Progress Notes (Addendum)
ANTICOAGULATION CONSULT NOTE - Initial Consult  Pharmacy Consult for heparin Indication: chest pain/ACS  Allergies  Allergen Reactions   Lisinopril Cough    Other Reaction(s): Other (See Comments)  Cough , Cough   Penicillins Other (See Comments), Hives and Itching    Did it involve swelling of the face/tongue/throat, SOB, or low BP? Yes  Did it involve sudden or severe rash/hives, skin peeling, or any reaction on the inside of your mouth or nose? No  Did you need to seek medical attention at a hospital or doctor's office? No  When did it last happen? Unk   If all above answers are "NO", may proceed with cephalosporin use.     *pt tolerated ancef on 08/08/16  Has patient had a PCN reaction causing immediate rash, facial/tongue/throat swelling, SOB or lightheadedness with hypotension: YES, Has patient had a PCN reaction causing severe rash involving mucus membranes or skin necrosis: NO, Has patient had a PCN reaction that required hospitalization NO, Has patient had a PCN reaction occurring within the last 10 years: NO, If all of the above answers are "NO", then may proceed with Cephalosporin use., Has patient had a PCN reaction causing immediate rash, facial   Sulfa Antibiotics Itching and Other (See Comments)    Itching, watery eyes  Other Reaction(s): Other (See Comments)  Hives , Other Reaction: Other reaction   Pravastatin Hives    Other Reaction(s): Other (See Comments)  Hives , Hives   Atorvastatin Other (See Comments)    Muscle pain  Other Reaction(s): Other (See Comments)  Muscle pain , Muscle pain   Codeine Hives    Other Reaction(s): Other (See Comments)  Hives , Hives   Flunisolide Hives    epistaxis  Other reaction(s): Bleeding from nose, Other (See Comments)  Hives   Hives  Other Reaction(s): Other (See Comments)  Hives , Hives   Loratadine     Hives   Other reaction(s): Other (See Comments), Other (See Comments)  Hives  Other Reaction(s):  Other (See Comments)  Hives , Hives   Niacin Hives and Other (See Comments)    Flushing, also  Other Reaction(s): Other (See Comments)  Hives   Niacin And Related     Hives    Simvastatin Hives    weakness Other reaction(s): Weakness present   Statins Hives    weakness  Other reaction(s): Other (See Comments), Unknown  Hives   Hives  Other Reaction(s): Other (See Comments)  Hives , Hives , Hives   Ciprofloxacin Rash    Hives   Other reaction(s): Other (See Comments), Other (See Comments)  Hives  Other Reaction(s): Other (See Comments)  Hives , Hives   Erythromycin Rash    Hives   Other reaction(s): Other (See Comments), Other (See Comments)  Hives  Other Reaction(s): Other (See Comments)  Hives , Hives   Ranitidine Rash    Hives   Other reaction(s): Other (See Comments), Other (See Comments)  Hives  Other Reaction(s): Other (See Comments)  Hives , Hives    Patient Measurements: Height: 6\' 4"  (193 cm) Weight: 81 kg (178 lb 9.2 oz) IBW/kg (Calculated) : 86.8 Heparin Dosing Weight: 81 kg  Vital Signs: Temp: 99.3 F (37.4 C) (08/29 0543) Temp Source: Oral (08/29 0543) BP: 123/86 (08/29 0900) Pulse Rate: 88 (08/29 0900)  Labs: Recent Labs    09/13/22 1435 09/14/22 0500 09/14/22 0700  HGB 10.3* 9.6*  --   HCT 33.2* 30.9*  --   PLT 178 151  --  APTT 34 38*  --   LABPROT 17.3* 18.5*  --   INR 1.4* 1.5*  --   CREATININE 0.86 1.56*  --   TROPONINIHS  --  1,623* 1,484*    Estimated Creatinine Clearance: 34.6 mL/min (A) (by C-G formula based on SCr of 1.56 mg/dL (H)).   Medical History: Past Medical History:  Diagnosis Date   Atrial fibrillation (HCC)    Back pain    COPD (chronic obstructive pulmonary disease) (HCC)    Diabetes mellitus without complication (HCC)    High cholesterol    Hypertension    Knee pain    Assessment: 89 YO male presenting with fever, chills, and shortness of breath, thought to be due to  pneumonia.Troponin 1623 >> 1484 on 8/29 AM. Concern for possible ACS, cardiology consulted. Patient takes low-dose Eliquis PTA for atrial fibrillation. Last dose of Eliquis 8/29 @0920 . Pharmacy has been consulted for heparin dosing x48 hours with plan to then transition back to Eliquis afterwards.   Today, 8/29: Hgb 9.6, plts 151--stable.  No s/sx of bleeding reported.  No bolus of heparin given recent administration of Eliquis.  Goal of Therapy:  Heparin level 0.3-0.7 units/ml aPTT 66-102 seconds Monitor platelets by anticoagulation protocol: Yes   Plan:  Start heparin gtt @950  units/hr on 8/29 @2100  Check aPTT 8 hours after heparin gtt starts--will check with 8/30 AM labs  Monitor heparin level, aPTT, CBC, and s/sx of bleeding daily Plan for heparin gtt x48 hours with plan to transition back to PTA Eliquis afterwards   Cherylin Mylar, PharmD  Clinical Pharmacist  8/29/202410:00 AM

## 2022-09-14 NOTE — Progress Notes (Signed)
   09/14/22 2322  BiPAP/CPAP/SIPAP  BiPAP/CPAP/SIPAP Pt Type Adult  Reason BIPAP/CPAP not in use Non-compliant (Patient wishes to use home machine. Family to get it tomorrow)

## 2022-09-14 NOTE — Evaluation (Signed)
Clinical/Bedside Swallow Evaluation Patient Details  Name: Jordan Hoffman MRN: [id number] Date of Birth: 1928-03-11  Today's Date: 09/14/2022 Time: SLP Start Time (ACUTE ONLY): 1350 SLP Stop Time (ACUTE ONLY): 1420 SLP Time Calculation (min) (ACUTE ONLY): 30 min  Past Medical History:  Past Medical History:  Diagnosis Date   Atrial fibrillation (HCC)    Back pain    COPD (chronic obstructive pulmonary disease) (HCC)    Diabetes mellitus without complication (HCC)    High cholesterol    Hypertension    Knee pain    Past Surgical History:  Past Surgical History:  Procedure Laterality Date   BACK SURGERY     HIP SURGERY     JOINT REPLACEMENT Left    left hip replacement   MOUTH SURGERY     REPLACEMENT TOTAL KNEE Right    TRANSURETHRAL RESECTION OF PROSTATE     HPI:  89 yo male adm to [facility] with cough, found to have pna.  Pt with PMH + for COPD, prior smoker, posterior left MCA CVA, OSA on Cpap, neuropathy. His medication list includes Neurontin, Albuterol, Prilosec, Mucinex, Claritin, Lasix, Colace.   Family present *wife and sons*.  Wife reports pt eats rapidly but denies noting issues with swallowing.  Pt has been on a reflux medication since prior "chest pain" event per his statement and he denies any issues with reflux.    Assessment / Plan / Recommendation  Clinical Impression  Normal oropharyngeal swallow ability based on clinical swallow evaluation. Swallow judged to be timely without any indication of deficits - laryngeal elevation observed and clear voice throughout. Did not conduct 3 ounce Yale as pt is on fluid restriction and had Yale in ED per pt's family.   Reviewed with pt and family purpose of Yale water challenge.  He does have h/o using a PPI  thus recommend general esophageal precautions.  No SLP follow up indicated at this time. Thanks for this consult. SLP Visit Diagnosis: Dysphagia, unspecified (R13.10)    Aspiration Risk  No limitations    Diet Recommendation      Reg/thin      Other  Recommendations   N/a   Recommendations for follow up therapy are one component of a multi-disciplinary discharge planning process, led by the attending physician.  Recommendations may be updated based on patient status, additional functional criteria and insurance authorization.  Follow up Recommendations      N/a  Assistance Recommended at Discharge    Functional Status Assessment    Frequency and Duration            Prognosis   N/a     Swallow Study   General HPI: 89 yo male adm to [facility] with cough, found to have pna.  Pt with PMH + for COPD, prior smoker, posterior left MCA CVA, OSA on Cpap, neuropathy. His medication list includes Neurontin, Albuterol, Prilosec, Mucinex, Claritin, Lasix, Colace.   Family present *wife and sons*.  Wife reports pt eats rapidly but denies noting issues with swallowing.  Pt has been on a reflux medication since prior "chest pain" event per his statement and he denies any issues with reflux. Type of Study: Bedside Swallow Evaluation Previous Swallow Assessment: none Diet Prior to this Study: Regular;Thin liquids (Level 0) Temperature Spikes Noted: No Respiratory Status: Nasal cannula History of Recent Intubation: No Behavior/Cognition: Alert;Cooperative Oral Cavity Assessment: Within Functional Limits Oral Care Completed by SLP: No Oral Cavity - Dentition: Adequate natural dentition Vision: Functional for self-feeding Self-Feeding Abilities:  Able to feed self Patient Positioning: Upright in bed Baseline Vocal Quality: Normal (family states his voice is a bit weaker than normal) Volitional Cough: Strong Volitional Swallow: Able to elicit    Oral/Motor/Sensory Function Overall Oral Motor/Sensory Function: Within functional limits   Ice Chips Ice chips: Not tested   Thin Liquid Thin Liquid: Within functional limits Presentation: Self Fed;Straw    Nectar Thick Nectar Thick Liquid: Not tested   Honey Thick Honey Thick  Liquid: Not tested   Puree Puree: Within functional limits   Solid     Solid: Within functional limits      Chales Abrahams 09/14/2022,2:39 PM Rolena Infante, MS [facility] SLP Acute Rehab Services Office [phone]

## 2022-09-14 NOTE — ED Notes (Signed)
When receiving Pt, Was reported that repeat CMP/troponin was recollected and sent for Pt. Noted that repeat CMP/troponin had not been run. Labs were not run and did not result following. 5am labs showed critical elevated troponin. Anthoney Harada NP paged.

## 2022-09-15 ENCOUNTER — Encounter (HOSPITAL_COMMUNITY): Payer: Self-pay | Admitting: Internal Medicine

## 2022-09-15 ENCOUNTER — Inpatient Hospital Stay (HOSPITAL_COMMUNITY): Payer: No Typology Code available for payment source

## 2022-09-15 DIAGNOSIS — A419 Sepsis, unspecified organism: Secondary | ICD-10-CM

## 2022-09-15 DIAGNOSIS — I5021 Acute systolic (congestive) heart failure: Secondary | ICD-10-CM | POA: Diagnosis not present

## 2022-09-15 DIAGNOSIS — R7989 Other specified abnormal findings of blood chemistry: Secondary | ICD-10-CM | POA: Diagnosis not present

## 2022-09-15 DIAGNOSIS — J189 Pneumonia, unspecified organism: Secondary | ICD-10-CM | POA: Diagnosis not present

## 2022-09-15 LAB — ECHOCARDIOGRAM COMPLETE
AR max vel: 2.33 cm2
AV Area VTI: 2.15 cm2
AV Area mean vel: 2.22 cm2
AV Mean grad: 5 mmHg
AV Peak grad: 8.6 mmHg
Ao pk vel: 1.47 m/s
Area-P 1/2: 4.57 cm2
Calc EF: 32.3 %
Height: 76 in
S' Lateral: 3.4 cm
Single Plane A2C EF: 29 %
Single Plane A4C EF: 36.2 %
Weight: 2857.16 [oz_av]

## 2022-09-15 LAB — BASIC METABOLIC PANEL
Anion gap: 6 (ref 5–15)
BUN: 32 mg/dL — ABNORMAL HIGH (ref 8–23)
CO2: 20 mmol/L — ABNORMAL LOW (ref 22–32)
Calcium: 8.9 mg/dL (ref 8.9–10.3)
Chloride: 111 mmol/L (ref 98–111)
Creatinine, Ser: 1.52 mg/dL — ABNORMAL HIGH (ref 0.61–1.24)
GFR, Estimated: 43 mL/min — ABNORMAL LOW (ref >60)
Glucose, Bld: 121 mg/dL — ABNORMAL HIGH (ref 70–99)
Potassium: 4.2 mmol/L (ref 3.5–5.1)
Sodium: 137 mmol/L (ref 135–145)

## 2022-09-15 LAB — URINE CULTURE: Culture: >=100000 — AB

## 2022-09-15 LAB — CALCIUM, IONIZED: Calcium, Ionized, Serum: 5.3 mg/dL (ref 4.5–5.6)

## 2022-09-15 LAB — MAGNESIUM: Magnesium: 1.9 mg/dL (ref 1.7–2.4)

## 2022-09-15 LAB — CBC
HCT: 30.3 % — ABNORMAL LOW (ref 39.0–52.0)
Hemoglobin: 9.3 g/dL — ABNORMAL LOW (ref 13.0–17.0)
MCH: 27.9 pg (ref 26.0–34.0)
MCHC: 30.7 g/dL (ref 30.0–36.0)
MCV: 91 fL (ref 80.0–100.0)
Platelets: 136 10*3/uL — ABNORMAL LOW (ref 150–400)
RBC: 3.33 MIL/uL — ABNORMAL LOW (ref 4.22–5.81)
RDW: 16.8 % — ABNORMAL HIGH (ref 11.5–15.5)
WBC: 10.3 10*3/uL (ref 4.0–10.5)
nRBC: 0 % (ref 0.0–0.2)

## 2022-09-15 LAB — APTT
aPTT: 42 s — ABNORMAL HIGH (ref 24–36)
aPTT: 54 seconds — ABNORMAL HIGH (ref 24–36)

## 2022-09-15 LAB — HEPARIN LEVEL (UNFRACTIONATED): Heparin Unfractionated: >1.1 [IU]/mL — ABNORMAL HIGH (ref 0.30–0.70)

## 2022-09-15 MED ORDER — BUDESONIDE 0.25 MG/2ML IN SUSP
0.2500 mg | Freq: Two times a day (BID) | RESPIRATORY_TRACT | Status: DC
  Administered 2022-09-15 – 2022-09-16 (×3): 0.25 mg via RESPIRATORY_TRACT
  Filled 2022-09-15 (×3): qty 2

## 2022-09-15 MED ORDER — HEPARIN BOLUS VIA INFUSION
1500.0000 [IU] | Freq: Once | INTRAVENOUS | Status: DC
  Filled 2022-09-15: qty 1500

## 2022-09-15 NOTE — Progress Notes (Signed)
TOC will follow for needs.    09/15/22 1005  TOC Brief Assessment  Insurance and Status Reviewed  Patient has primary care physician Yes  Home environment has been reviewed [place] with spouse  Prior level of function: Needs assitance with ADLs, has DME  Prior/Current Home Services No current home services  Social Determinants of Health Reivew SDOH reviewed no interventions necessary  Readmission risk has been reviewed Yes  Transition of care needs transition of care needs identified, TOC will continue to follow

## 2022-09-15 NOTE — Progress Notes (Addendum)
PROGRESS NOTE    Jordan Hoffman  ZOX:[id number] DOB: Jul 29, 1928 DOA: 09/13/2022 PCP: [facility]   Brief Narrative:    Jordan Hoffman is a 89 y.o. male with medical history significant of patient has a history of having 3 episodes of pneumonia over the last 1 year.  [facility] and has a pulmonologist and cardiologist who provide close outpatient care.  Patient was in his usual state of health till about 2 days ago when he reports an insidious onset of cough with scant expectoration associated with sensation of shortness of breath and fevers as high as 103 F.  There is no report of chest pain or loss of consciousness or leg swelling or abdominal pain or nausea vomiting or diarrhea.  Patient shortness of breath was worsened by exertion such as when he would try to walk in the house with his cane.   Patient was admitted with pneumonia and associated fever and is also noted to have troponin elevation.  Subjective:  Assessment and Plan:  Fever -suspected community-acquired pneumonia  Klebsiella UTI Sepsis with tachypnea & fever  -Urine culture from 8/28 reveals Klebsiella pneumonia -Blood cultures negative -Continue doxycycline, Rocephin, and Flagyl  -SLP evaluation> regular diet with thin liquids  Elevated troponin> NSTEMI Chronic systolic heart failure -Continue heparin infusion per cardiology -2D echo today shows an EF of 30 to 35% with global hypokinesis-diastolic function cannot be evaluated, moderately elevated pulmonary pressure with mildly reduced RV function dilated bilateral atria -Continue hydralazine, isosorbide mononitrate, metoprolol and Lasix  History of atrial fibrillation Continue metoprolol- on Heparin infusion   Hypokalemia - Replaced  BPH Continue tamsulosin    DVT prophylaxis:Eliquis Code Status: Full Consultants: Cardiology   Objective: Vitals:   09/15/22 0937 09/15/22 1043 09/15/22 1236 09/15/22 1334  BP:   (!) 99/52 119/74   Pulse:   82   Resp:   18   Temp:  99.7 F (37.6 C) 98.7 F (37.1 C)   TempSrc:  Oral Oral   SpO2: 97%  98%   Weight:      Height:        Intake/Output Summary (Last 24 hours) at 09/15/2022 1915 Last data filed at 09/15/2022 1856 Gross per 24 hour  Intake 2556.16 ml  Output 2000 ml  Net 556.16 ml   Filed Weights   09/13/22 1330  Weight: 81 kg    Examination: General exam: Appears comfortable  HEENT: oral mucosa moist Respiratory system: Clear to auscultation.  Cardiovascular system: S1 & S2 heard  Gastrointestinal system: Abdomen soft, non-tender, nondistended. Normal bowel sounds   Extremities: No cyanosis, clubbing or edema Psychiatry:  Mood & affect appropriate.   Data Reviewed: I have personally reviewed following labs and imaging studies  CBC: Recent Labs  Lab 09/13/22 1435 09/14/22 0500 09/15/22 0510  WBC 10.2 14.0* 10.3  NEUTROABS 9.1*  --   --   HGB 10.3* 9.6* 9.3*  HCT 33.2* 30.9* 30.3*  MCV 89.5 89.8 91.0  PLT 178 151 136*   Basic Metabolic Panel: Recent Labs  Lab 09/13/22 1435 09/14/22 0500 09/15/22 0510  NA 145 135 137  K 2.2* 4.3 4.2  CL 127* 107 111  CO2 14* 21* 20*  GLUCOSE 78 118* 121*  BUN 25* 39* 32*  CREATININE 0.86 1.56* 1.52*  CALCIUM 5.3* 8.9 8.9  MG  --  2.0 1.9   Scheduled Meds:  budesonide (PULMICORT) nebulizer solution  0.25 mg Nebulization BID   calcium carbonate  1 tablet  Oral BID   furosemide  20 mg Oral Daily   heparin  1,500 Units Intravenous Once   hydrALAZINE  25 mg Oral Q8H   isosorbide mononitrate  30 mg Oral Daily   latanoprost  1 drop Both Eyes QHS   loratadine  10 mg Oral Daily   metoprolol succinate  25 mg Oral Daily   sodium chloride flush  3 mL Intravenous Q12H   tamsulosin  0.4 mg Oral QPM   Continuous Infusions:  sodium chloride 10 mL/hr at 09/14/22 1800   cefTRIAXone (ROCEPHIN)  IV 2 g (09/14/22 2030)   doxycycline (VIBRAMYCIN) IV Stopped (09/15/22 1255)   heparin 1,350 Units/hr (09/15/22 1851)    metronidazole 500 mg (09/15/22 0925)     LOS: 2 days    Time spent: 35 minutes    Calvert Cantor, MD [facility]  If 7PM-7AM, please contact night-coverage [email or web address] 09/15/2022, 7:15 PM

## 2022-09-15 NOTE — Progress Notes (Addendum)
ANTICOAGULATION CONSULT NOTE   Pharmacy Consult for heparin Indication: chest pain/ACS  Allergies  Allergen Reactions   Lisinopril Cough   Penicillins Hives, Rash and Other (See Comments)    Patient and his wife reported he broke out in bad rash. Last exposed to penicillins over 70yrs ago. Family declined rechallenging pencillins on 09/14/22.    Sulfa Antibiotics Rash and Other (See Comments)   Pravastatin Hives   Atorvastatin Other (See Comments)    Muscle pain   Codeine Hives   Flunisolide Hives and Other (See Comments)    Epistaxis, bleeding from nose   Loratadine Hives   Niacin Hives and Other (See Comments)    Flushing   Niacin And Related     Hives    Simvastatin Hives    weakness Other reaction(s): Weakness present   Statins Hives and Other (See Comments)    Weakness   Ciprofloxacin Rash   Erythromycin Rash   Ranitidine Rash    Patient Measurements: Height: 6\' 4"  (193 cm) Weight: 81 kg (178 lb 9.2 oz) IBW/kg (Calculated) : 86.8 Heparin Dosing Weight: 81 kg  Vital Signs: Temp: 99.4 F (37.4 C) (08/30 0310) Temp Source: Oral (08/30 0310) BP: 124/72 (08/30 0600) Pulse Rate: 79 (08/30 0310)  Labs: Recent Labs    09/13/22 1435 09/13/22 1435 09/14/22 0500 09/14/22 0700 09/14/22 1228 09/14/22 1407 09/15/22 0510  HGB 10.3*  --  9.6*  --   --   --  9.3*  HCT 33.2*  --  30.9*  --   --   --  30.3*  PLT 178  --  151  --   --   --  136*  APTT 34  --  38*  --   --   --  42*  LABPROT 17.3*  --  18.5*  --   --   --   --   INR 1.4*  --  1.5*  --   --   --   --   HEPARINUNFRC  --   --   --   --   --   --  >1.10*  CREATININE 0.86  --  1.56*  --   --   --  1.52*  TROPONINIHS  --    < > 1,623* 1,484* 1,123* 1,153*  --    < > = values in this interval not displayed.    Estimated Creatinine Clearance: 35.5 mL/min (A) (by C-G formula based on SCr of 1.52 mg/dL (H)).   Medical History: Past Medical History:  Diagnosis Date   Atrial fibrillation (HCC)    Back  pain    COPD (chronic obstructive pulmonary disease) (HCC)    Diabetes mellitus without complication (HCC)    High cholesterol    Hypertension    Knee pain    Assessment: 89 YO male presenting with fever, chills, and shortness of breath, thought to be due to pneumonia.Troponin 1623 >> 1484 on 8/29 AM. Concern for possible ACS, cardiology consulted. Patient takes low-dose Eliquis PTA for atrial fibrillation. Last dose of Eliquis 8/29 @0920 . Pharmacy has been consulted for heparin dosing x48 hours with plan to then transition back to Eliquis afterwards.   09/15/22 Heparin level > 1.1 (remains falsely elevated due to recent DOAC use) aPTT = 44 sec (subtherapeutic) with heparin gtt @ 950 units/hr Hgb 9.3, plts 136k--stable.  No s/sx of bleeding reported.    Goal of Therapy:  Heparin level 0.3-0.7 units/ml aPTT 66-102 seconds Monitor platelets by anticoagulation protocol: Yes  Plan:  Give heparin 1500 unit IV bolus x 1 Increase heparin gtt to 1150 units/hr  Check aPTT 8 hours after heparin rate increased   Monitor heparin level, aPTT, CBC, and s/sx of bleeding daily Plan for heparin gtt x48 hours with plan to transition back to PTA Eliquis afterwards   Terrilee Files, PharmD 8/30/20246:25 AM

## 2022-09-15 NOTE — Progress Notes (Signed)
   09/15/22 2310  BiPAP/CPAP/SIPAP  BiPAP/CPAP/SIPAP Pt Type Adult  Reason BIPAP/CPAP not in use Non-compliant (patient wishes to use home unit.)  BiPAP/CPAP /SiPAP Vitals  Pulse Rate 84  Resp 14  SpO2 100 %  Bilateral Breath Sounds Clear;Diminished  MEWS Score/Color  MEWS Score 0  MEWS Score Color Jordan Hoffman

## 2022-09-15 NOTE — Progress Notes (Signed)
ANTICOAGULATION CONSULT NOTE   Pharmacy Consult for heparin Indication: chest pain/ACS  Allergies  Allergen Reactions   Lisinopril Cough   Penicillins Hives, Rash and Other (See Comments)    Patient and his wife reported he broke out in bad rash. Last exposed to penicillins over 30yrs ago. Family declined rechallenging pencillins on 09/14/22.    Sulfa Antibiotics Rash and Other (See Comments)   Pravastatin Hives   Atorvastatin Other (See Comments)    Muscle pain   Codeine Hives   Flunisolide Hives and Other (See Comments)    Epistaxis, bleeding from nose   Loratadine Hives   Niacin Hives and Other (See Comments)    Flushing   Niacin And Related     Hives    Simvastatin Hives    weakness Other reaction(s): Weakness present   Statins Hives and Other (See Comments)    Weakness   Ciprofloxacin Rash   Erythromycin Rash   Ranitidine Rash    Patient Measurements: Height: 6\' 4"  (193 cm) Weight: 81 kg (178 lb 9.2 oz) IBW/kg (Calculated) : 86.8 Heparin Dosing Weight: 81 kg  Vital Signs: Temp: 98.7 F (37.1 C) (08/30 1236) Temp Source: Oral (08/30 1236) BP: 119/74 (08/30 1334) Pulse Rate: 82 (08/30 1236)  Labs: Recent Labs    09/13/22 1435 09/13/22 1435 09/14/22 0500 09/14/22 0700 09/14/22 1228 09/14/22 1407 09/15/22 0510 09/15/22 1459  HGB 10.3*  --  9.6*  --   --   --  9.3*  --   HCT 33.2*  --  30.9*  --   --   --  30.3*  --   PLT 178  --  151  --   --   --  136*  --   APTT 34  --  38*  --   --   --  42* 54*  LABPROT 17.3*  --  18.5*  --   --   --   --   --   INR 1.4*  --  1.5*  --   --   --   --   --   HEPARINUNFRC  --   --   --   --   --   --  >1.10*  --   CREATININE 0.86  --  1.56*  --   --   --  1.52*  --   TROPONINIHS  --    < > 1,623* 1,484* 1,123* 1,153*  --   --    < > = values in this interval not displayed.    Estimated Creatinine Clearance: 35.5 mL/min (A) (by C-G formula based on SCr of 1.52 mg/dL (H)).   Medical History: Past Medical History:   Diagnosis Date   Atrial fibrillation (HCC)    Back pain    COPD (chronic obstructive pulmonary disease) (HCC)    Diabetes mellitus without complication (HCC)    High cholesterol    Hypertension    Knee pain    Assessment: 89 YO male presenting with fever, chills, and shortness of breath, thought to be due to pneumonia.Troponin 1623 >> 1484 on 8/29 AM. Concern for possible ACS, cardiology consulted. Patient takes low-dose Eliquis PTA for atrial fibrillation. Last dose of Eliquis 8/29 @0920 . Pharmacy has been consulted for heparin dosing x48 hours with plan to then transition back to Eliquis afterwards.   09/15/22 Heparin level > 1.1 (remains falsely elevated due to recent DOAC use) aPTT = 54 sec (subtherapeutic) with heparin gtt @ 1150 units/hr Hgb 9.3, plts 136k--stable.  No s/sx of bleeding reported.    Goal of Therapy:  Heparin level 0.3-0.7 units/ml aPTT 66-102 seconds Monitor platelets by anticoagulation protocol: Yes   Plan:  Increase heparin gtt to 1350 units/hr  Check aPTT 8 hours after heparin rate increased   Monitor heparin level, aPTT, CBC, and s/sx of bleeding daily Plan for heparin gtt x48 hours with plan to transition back to PTA Eliquis afterwards   Adalberto Cole, PharmD, BCPS 09/15/2022 8:24 PM

## 2022-09-15 NOTE — Plan of Care (Signed)
  Problem: Safety: Goal: Ability to remain free from injury will improve Outcome: Progressing   Problem: Skin Integrity: Goal: Risk for impaired skin integrity will decrease Outcome: Progressing   

## 2022-09-16 DIAGNOSIS — I10 Essential (primary) hypertension: Secondary | ICD-10-CM

## 2022-09-16 DIAGNOSIS — D649 Anemia, unspecified: Secondary | ICD-10-CM

## 2022-09-16 DIAGNOSIS — I429 Cardiomyopathy, unspecified: Secondary | ICD-10-CM

## 2022-09-16 DIAGNOSIS — I2489 Other forms of acute ischemic heart disease: Secondary | ICD-10-CM

## 2022-09-16 DIAGNOSIS — I4819 Other persistent atrial fibrillation: Secondary | ICD-10-CM

## 2022-09-16 DIAGNOSIS — I5022 Chronic systolic (congestive) heart failure: Secondary | ICD-10-CM

## 2022-09-16 DIAGNOSIS — Z7901 Long term (current) use of anticoagulants: Secondary | ICD-10-CM | POA: Diagnosis not present

## 2022-09-16 DIAGNOSIS — I214 Non-ST elevation (NSTEMI) myocardial infarction: Secondary | ICD-10-CM

## 2022-09-16 LAB — CBC
HCT: 28.4 % — ABNORMAL LOW (ref 39.0–52.0)
Hemoglobin: 8.8 g/dL — ABNORMAL LOW (ref 13.0–17.0)
MCH: 28.1 pg (ref 26.0–34.0)
MCHC: 31 g/dL (ref 30.0–36.0)
MCV: 90.7 fL (ref 80.0–100.0)
Platelets: 135 10*3/uL — ABNORMAL LOW (ref 150–400)
RBC: 3.13 MIL/uL — ABNORMAL LOW (ref 4.22–5.81)
RDW: 16.5 % — ABNORMAL HIGH (ref 11.5–15.5)
WBC: 8.7 10*3/uL (ref 4.0–10.5)
nRBC: 0 % (ref 0.0–0.2)

## 2022-09-16 LAB — BASIC METABOLIC PANEL
Anion gap: 9 (ref 5–15)
BUN: 30 mg/dL — ABNORMAL HIGH (ref 8–23)
CO2: 19 mmol/L — ABNORMAL LOW (ref 22–32)
Calcium: 8.8 mg/dL — ABNORMAL LOW (ref 8.9–10.3)
Chloride: 108 mmol/L (ref 98–111)
Creatinine, Ser: 1.4 mg/dL — ABNORMAL HIGH (ref 0.61–1.24)
GFR, Estimated: 47 mL/min — ABNORMAL LOW (ref >60)
Glucose, Bld: 108 mg/dL — ABNORMAL HIGH (ref 70–99)
Potassium: 3.9 mmol/L (ref 3.5–5.1)
Sodium: 136 mmol/L (ref 135–145)

## 2022-09-16 LAB — APTT: aPTT: 58 s — ABNORMAL HIGH (ref 24–36)

## 2022-09-16 LAB — HEPARIN LEVEL (UNFRACTIONATED): Heparin Unfractionated: 0.68 [IU]/mL (ref 0.30–0.70)

## 2022-09-16 LAB — PARATHYROID HORMONE, INTACT (NO CA): PTH: 30 pg/mL (ref 15–65)

## 2022-09-16 MED ORDER — DICLOFENAC SODIUM 1 % EX GEL: 2.0000 g | Freq: Four times a day (QID) | CUTANEOUS | Status: DC | PRN

## 2022-09-16 MED ORDER — EYE WASH OP SOLN: 1.0000 [drp] | OPHTHALMIC | Status: DC | PRN

## 2022-09-16 MED ORDER — MUSCLE RUB 10-15 % EX CREA
TOPICAL_CREAM | CUTANEOUS | Status: DC | PRN
  Administered 2022-09-16: 1 via TOPICAL
  Filled 2022-09-16: qty 85

## 2022-09-16 MED ORDER — BUDESONIDE 0.5 MG/2ML IN SUSP
1.0000 mg | Freq: Two times a day (BID) | RESPIRATORY_TRACT | Status: DC
  Administered 2022-09-16 – 2022-09-18 (×4): 1 mg via RESPIRATORY_TRACT
  Filled 2022-09-16 (×4): qty 4

## 2022-09-16 MED ORDER — DOCUSATE SODIUM 100 MG PO CAPS
100.0000 mg | ORAL_CAPSULE | Freq: Every day | ORAL | Status: DC
  Administered 2022-09-16 – 2022-09-18 (×3): 100 mg via ORAL
  Filled 2022-09-16 (×3): qty 1

## 2022-09-16 MED ORDER — CARBOXYMETHYLCELLULOSE SODIUM 0.5 % OP SOLN: 2.0000 [drp] | Freq: Three times a day (TID) | OPHTHALMIC | Status: DC | PRN

## 2022-09-16 NOTE — Progress Notes (Signed)
ANTICOAGULATION CONSULT NOTE   Pharmacy Consult for heparin Indication: chest pain/ACS  Allergies  Allergen Reactions   Lisinopril Cough   Penicillins Hives, Rash and Other (See Comments)    Patient and his wife reported he broke out in bad rash. Last exposed to penicillins over 47yrs ago. Family declined rechallenging pencillins on 09/14/22.    Sulfa Antibiotics Rash and Other (See Comments)   Pravastatin Hives   Atorvastatin Other (See Comments)    Muscle pain   Codeine Hives   Flunisolide Hives and Other (See Comments)    Epistaxis, bleeding from nose   Loratadine Hives   Niacin Hives and Other (See Comments)    Flushing   Niacin And Related     Hives    Simvastatin Hives    weakness Other reaction(s): Weakness present   Statins Hives and Other (See Comments)    Weakness   Ciprofloxacin Rash   Erythromycin Rash   Ranitidine Rash    Patient Measurements: Height: 6\' 4"  (193 cm) Weight: 81 kg (178 lb 9.2 oz) IBW/kg (Calculated) : 86.8 Heparin Dosing Weight: 81 kg  Vital Signs: Temp: 98.5 F (36.9 C) (08/30 2118) Temp Source: Oral (08/30 2118) BP: 138/76 (08/30 2118) Pulse Rate: 84 (08/30 2310)  Labs: Recent Labs    09/13/22 1435 09/13/22 1435 09/14/22 0500 09/14/22 0700 09/14/22 1228 09/14/22 1407 09/15/22 0510 09/15/22 1459 09/16/22 0100  HGB 10.3*  --  9.6*  --   --   --  9.3*  --  8.8*  HCT 33.2*  --  30.9*  --   --   --  30.3*  --  28.4*  PLT 178  --  151  --   --   --  136*  --  135*  APTT 34  --  38*  --   --   --  42* 54* 58*  LABPROT 17.3*  --  18.5*  --   --   --   --   --   --   INR 1.4*  --  1.5*  --   --   --   --   --   --   HEPARINUNFRC  --   --   --   --   --   --  >1.10*  --  0.68  CREATININE 0.86  --  1.56*  --   --   --  1.52*  --  1.40*  TROPONINIHS  --    < > 1,623* 1,484* 1,123* 1,153*  --   --   --    < > = values in this interval not displayed.    Estimated Creatinine Clearance: 38.6 mL/min (A) (by C-G formula based on SCr of  1.4 mg/dL (H)).   Medical History: Past Medical History:  Diagnosis Date   Atrial fibrillation (HCC)    Back pain    COPD (chronic obstructive pulmonary disease) (HCC)    Diabetes mellitus without complication (HCC)    High cholesterol    Hypertension    Knee pain    Assessment: 89 YO male presenting with fever, chills, and shortness of breath, thought to be due to pneumonia.Troponin 1623 >> 1484 on 8/29 AM. Concern for possible ACS, cardiology consulted. Patient takes low-dose Eliquis PTA for atrial fibrillation. Last dose of Eliquis 8/29 @0920 . Pharmacy has been consulted for heparin dosing x48 hours with plan to then transition back to Eliquis afterwards.   09/16/22 Heparin level 0.68 (trending down but remains falsely elevated due to  recent DOAC use) aPTT = 58 sec (subtherapeutic) with heparin gtt @ 1350 units/hr Hgb 8.8, plts 135k No s/sx of bleeding reported.    Goal of Therapy:  Heparin level 0.3-0.7 units/ml aPTT 66-102 seconds Monitor platelets by anticoagulation protocol: Yes   Plan:  Increase heparin gtt to 1500 units/hr  Check aPTT 8 hours after heparin rate increased   Monitor heparin level, aPTT, CBC, and s/sx of bleeding daily Plan for heparin gtt x48 hours with plan to transition back to PTA Eliquis afterwards   Terrilee Files, PharmD 09/16/2022 2:02 AM

## 2022-09-16 NOTE — Progress Notes (Signed)
Progress Note  Patient Name: Jordan Hoffman MRN: [id number] DOB: [birth year] Date of Encounter: 09/16/2022  Attending physician: Salomon Mast* Primary care provider: [facility] Primary Cardiologist: [facility]   Subjective: Jordan Hoffman is a 89 y.o. African-American male who was seen and examined at bedside  Wife at bedside  No chest pain or shortness of breath  Case discussed and reviewed with his nurse.  Objective: Vital Signs in the last 24 hours: Temp:  [98.3 F (36.8 C)-98.7 F (37.1 C)] 98.3 F (36.8 C) (08/31 0549) Pulse Rate:  [75-86] 75 (08/31 0549) Resp:  [14-19] 19 (08/31 0549) BP: (99-138)/(52-76) 127/75 (08/31 0549) SpO2:  [98 %-100 %] 98 % (08/31 0848)  Intake/Output:  Intake/Output Summary (Last 24 hours) at 09/16/2022 1209 Last data filed at 09/16/2022 1112 Gross per 24 hour  Intake 968.85 ml  Output 1850 ml  Net -881.15 ml    Net IO Since Admission: 647.23 mL [09/16/22 1209]  Weights:     09/13/2022    1:30 PM 11/10/2021    1:01 PM 11/09/2021    7:00 PM  Last 3 Weights  Weight (lbs) 178 lb 9.2 oz 179 lb 7.3 oz 173 lb  Weight (kg) 81 kg 81.4 kg 78.472 kg      Telemetry:  Overnight telemetry shows AFib rate controlled, PVCs, rare episodes of NSVT,  which I personally reviewed.   Physical examination: PHYSICAL EXAM: Vitals:   09/15/22 2118 09/15/22 2310 09/16/22 0549 09/16/22 0848  BP: 138/76  127/75   Pulse: 86 84 75   Resp:  14 19   Temp: 98.5 F (36.9 C)  98.3 F (36.8 C)   TempSrc: Oral  Oral   SpO2: 100% 100% 98% 98%  Weight:      Height:        Physical Exam  Constitutional: No distress. He appears chronically ill.  hemodynamically stable.   Neck: No JVD present.  Cardiovascular: Normal rate, S1 normal and S2 normal. An irregularly irregular rhythm present. Exam reveals no gallop, no S3 and no S4.  No murmur heard. Pulmonary/Chest: Effort normal and breath sounds normal. No stridor. He has no wheezes. He has no  rales.  Abdominal: Soft. Bowel sounds are normal. He exhibits no distension. There is no abdominal tenderness.  Musculoskeletal:        General: No edema.     Cervical back: Neck supple.  Neurological: He is alert and oriented to person, place, and time. He has intact cranial nerves (2-12).  Skin: Skin is warm and moist.    Lab Results: Chemistry Recent Labs  Lab 09/13/22 1435 09/14/22 0500 09/15/22 0510 09/16/22 0100  NA 145 135 137 136  K 2.2* 4.3 4.2 3.9  CL 127* 107 111 108  CO2 14* 21* 20* 19*  GLUCOSE 78 118* 121* 108*  BUN 25* 39* 32* 30*  CREATININE 0.86 1.56* 1.52* 1.40*  CALCIUM 5.3* 8.9 8.9 8.8*  PROT 3.6* 6.3*  --   --   ALBUMIN 1.9* 3.1*  --   --   AST 12* 22  --   --   ALT 7 12  --   --   ALKPHOS 43 74  --   --   BILITOT 0.7 1.3*  --   --   GFRNONAA >60 41* 43* 47*  ANIONGAP 4* 7 6 9     Hematology Recent Labs  Lab 09/14/22 0500 09/15/22 0510 09/16/22 0100  WBC 14.0* 10.3 8.7  RBC 3.44* 3.33* 3.13*  HGB 9.6* 9.3* 8.8*  HCT 30.9* 30.3* 28.4*  MCV 89.8 91.0 90.7  MCH 27.9 27.9 28.1  MCHC 31.1 30.7 31.0  RDW 16.6* 16.8* 16.5*  PLT 151 136* 135*   High Sensitivity Troponin:   Recent Labs  Lab 09/14/22 0500 09/14/22 0700 09/14/22 1228 09/14/22 1407  TROPONINIHS 1,623* 1,484* 1,123* 1,153*     Cardiac EnzymesNo results for input(s): "TROPONINI" in the last 168 hours. No results for input(s): "TROPIPOC" in the last 168 hours.  BNP Recent Labs  Lab 09/14/22 0500  BNP 519.0*    DDimer No results for input(s): "DDIMER" in the last 168 hours.  Hemoglobin A1c:  Lab Results  Component Value Date   HGBA1C 7.0 (H) 11/10/2021   MPG 154.2 11/10/2021   TSH  Recent Labs    09/26/21 0729  TSH 0.513   Lipid Panel No results found for: "CHOL", "HDL", "LDLCALC", "LDLDIRECT", "TRIG", "CHOLHDL" Drugs of Abuse  No results found for: "LABOPIA", "COCAINSCRNUR", "LABBENZ", "AMPHETMU", "THCU", "LABBARB"    Imaging: ECHOCARDIOGRAM COMPLETE  Result  Date: 09/15/2022    ECHOCARDIOGRAM REPORT   Patient Name:   Jordan Hoffman Date of Exam: 09/15/2022 Medical Rec #:  [id number]   Height:       76.0 in Accession #:    [id number]  Weight:       178.6 lb Date of Birth:  December 07, 1928    BSA:          2.111 m Patient Age:    89 years    BP:           117/85 mmHg Patient Gender: M           HR:           74 bpm. Exam Location:  Inpatient Procedure: 2D Echo, Color Doppler and Cardiac Doppler Indications:    Elevated Troponin  History:        Patient has prior history of Echocardiogram examinations, most                 recent 09/26/2021. Cardiomyopathy, Previous Myocardial                 Infarction, COPD and ILD, CKD, Arrythmias:Atrial Fibrillation;                 Risk Factors:Hypertension, Dyslipidemia, Diabetes and Sleep                 Apnea.  Sonographer:    Milbert Coulter Referring Phys: [id number] PRATIK D [facility] IMPRESSIONS  1. Left ventricular ejection fraction, by estimation, is 30 to 35%. Left ventricular ejection fraction by 2D MOD biplane is 32.3 %. The left ventricle has moderately decreased function. The left ventricle demonstrates global hypokinesis. There is moderate left ventricular hypertrophy. Left ventricular diastolic function could not be evaluated.  2. Right ventricular systolic function is mildly reduced. The right ventricular size is normal. There is moderately elevated pulmonary artery systolic pressure. The estimated right ventricular systolic pressure is 53.7 mmHg.  3. Left atrial size was moderately dilated.  4. Right atrial size was severely dilated.  5. The mitral valve is abnormal. Trivial mitral valve regurgitation.  6. Tricuspid valve regurgitation is mild to moderate.  7. The aortic valve is calcified. Aortic valve regurgitation is not visualized. Aortic valve sclerosis/calcification is present, without any evidence of aortic stenosis. Aortic valve mean gradient measures 5.0 mmHg.  8. Aortic dilatation noted. There is mild dilatation of the ascending  aorta, measuring 42 mm.  9. The inferior vena cava is dilated in size with <50% respiratory variability, suggesting right atrial pressure of 15 mmHg. Comparison(s): Changes from prior study are noted. 09/26/2021: LVEF 30-35%. FINDINGS  Left Ventricle: Left ventricular ejection fraction, by estimation, is 30 to 35%. Left ventricular ejection fraction by 2D MOD biplane is 32.3 %. The left ventricle has moderately decreased function. The left ventricle demonstrates global hypokinesis. The left ventricular internal cavity size was normal in size. There is moderate left ventricular hypertrophy. Left ventricular diastolic function could not be evaluated due to atrial fibrillation. Left ventricular diastolic function could not be evaluated. Right Ventricle: The right ventricular size is normal. No increase in right ventricular wall thickness. Right ventricular systolic function is mildly reduced. There is moderately elevated pulmonary artery systolic pressure. The tricuspid regurgitant velocity is 3.11 m/s, and with an assumed right atrial pressure of 15 mmHg, the estimated right ventricular systolic pressure is 53.7 mmHg. Left Atrium: Left atrial size was moderately dilated. Right Atrium: Right atrial size was severely dilated. Pericardium: There is no evidence of pericardial effusion. Mitral Valve: The mitral valve is abnormal. There is mild calcification of the anterior and posterior mitral valve leaflet(s). Mild to moderate mitral annular calcification. Trivial mitral valve regurgitation. Tricuspid Valve: The tricuspid valve is grossly normal. Tricuspid valve regurgitation is mild to moderate. Aortic Valve: The aortic valve is calcified. Aortic valve regurgitation is not visualized. Aortic valve sclerosis/calcification is present, without any evidence of aortic stenosis. Aortic valve mean gradient measures 5.0 mmHg. Aortic valve peak gradient measures 8.6 mmHg. Aortic valve area, by VTI measures 2.15 cm. Pulmonic  Valve: The pulmonic valve was grossly normal. Pulmonic valve regurgitation is trivial. Aorta: Aortic dilatation noted. There is mild dilatation of the ascending aorta, measuring 42 mm. Venous: The inferior vena cava is dilated in size with less than 50% respiratory variability, suggesting right atrial pressure of 15 mmHg. IAS/Shunts: No atrial level shunt detected by color flow Doppler.  LEFT VENTRICLE PLAX 2D                        Biplane EF (MOD) LVIDd:         4.50 cm         LV Biplane EF:   Left LVIDs:         3.40 cm                          ventricular LV PW:         1.40 cm                          ejection LV IVS:        1.30 cm                          fraction by LVOT diam:     2.50 cm                          2D MOD LV SV:         54                               biplane is LV SV Index:   26  32.3 %. LVOT Area:     4.91 cm                                Diastology                                LV e' medial:    6.53 cm/s LV Volumes (MOD)               LV E/e' medial:  18.1 LV vol d, MOD    126.4 ml      LV e' lateral:   10.20 cm/s A2C:                           LV E/e' lateral: 11.6 LV vol d, MOD    130.8 ml A4C: LV vol s, MOD    89.7 ml A2C: LV vol s, MOD    83.5 ml A4C: LV SV MOD A2C:   36.6 ml LV SV MOD A4C:   130.8 ml LV SV MOD BP:    41.9 ml RIGHT VENTRICLE RV Basal diam:  3.40 cm RV Mid diam:    2.30 cm RV S prime:     9.14 cm/s TAPSE (M-mode): 2.0 cm LEFT ATRIUM             Index        RIGHT ATRIUM           Index LA diam:        3.40 cm 1.61 cm/m   RA Area:     33.60 cm LA Vol (A2C):   92.1 ml 43.63 ml/m  RA Volume:   116.00 ml 54.95 ml/m LA Vol (A4C):   78.5 ml 37.19 ml/m LA Biplane Vol: 88.1 ml 41.73 ml/m  AORTIC VALVE AV Area (Vmax):    2.33 cm AV Area (Vmean):   2.22 cm AV Area (VTI):     2.15 cm AV Vmax:           147.00 cm/s AV Vmean:          98.600 cm/s AV VTI:            0.254 m AV Peak Grad:      8.6 mmHg AV Mean Grad:      5.0 mmHg LVOT Vmax:          69.80 cm/s LVOT Vmean:        44.600 cm/s LVOT VTI:          0.111 m LVOT/AV VTI ratio: 0.44  AORTA Ao Root diam: 3.90 cm Ao Asc diam:  4.20 cm MITRAL VALVE                TRICUSPID VALVE MV Area (PHT): 4.57 cm     TR Peak grad:   38.7 mmHg MV Decel Time: 166 msec     TR Vmax:        311.00 cm/s MV E velocity: 118.00 cm/s                             SHUNTS                             Systemic VTI:  0.11 m  Systemic Diam: 2.50 cm Zoila Shutter MD Electronically signed by Zoila Shutter MD Signature Date/Time: 09/15/2022/2:47:04 PM    Final     CARDIAC DATABASE: EKG: 09/13/2022: Atrial fibrillation, 102 bpm, left anterior fascicular block, inferior lateral ST depressions, without underlying injury pattern. Similar findings on tracing 09/25/2021.   Echocardiogram: 09/26/2021 LVEF 30-35%, global hypokinesis, RV mildly dilated, RVSP 52 mmHg, mild LAE, right atrium dilated, mild MR, moderate TR, aortic valve sclerosis without stenosis, RVSP 52 mmHg, RAP 15 mmHg.  09/15/2022: LVEF 30 to 35%, moderately reduced LVEF, global hypokinesis, RV systolic function mildly reduced, RV size normal, RVSP 53.7 mmHg, left atrium moderately dilated, right atrium severely dilated, trivial MR, mild to moderate TR, aortic valve sclerosis without stenosis, ascending aorta 42 mm, estimated RAP 15 mmHg.  PER CARE EVERYWHERE:  Echo 08/28/2021 Per report: LVEF 30-35%, severe global hypokinesis, mild LAE, moderate RAE, normal right ventricular size and function, moderate MR, mild to moderate TR, IVC severely dilated, no pericardial effusion   PYP study 05/06/2021: H/CL ratio of 1.3. Planar Grade 1. SPECT Grade 2. Findings are overall equivocal for TTR amyloidosis.    ECHO: 01/19/2020 Interpretation Summary Compared to prior study, there is no significant change.  Normal LV size. Moderate LVH. LVEF>55%. Normal RV size and function. Mild RA enlargement. Mild TR. Mild MR.The estimated RVSP=  Trivial PR. No AR. Borderline aortic root enlargement. No pericardial effusion.   Echocardiogram May 2024 at [facility] available in Care Everywhere: LVEF 35-40%, mild MR.  No pericardial effusion   Cardiac MRI 11/09/2020: 1. Normal-sized left ventricle with severe left ventricular systolic dysfunction with a calculated ejection fraction of 29%.    2. Mild left ventricular hypertrophy with areas of mid myocardial delayed enhancement as described above which could be related to sequelae of prior myocarditis or sarcoidosis.    3. Mild to moderately dilated right ventricle with normal right ventricular systolic function.    4. Mildly dilated left atrium and moderately dilated right atrium.    5. Mild tricuspid regurgitation.   Cardiac Cath (08/18/15) 1. Diffuse dense calcific plaque ascending aorta without aneurysmal changes  or dissection  2. Right dominant coronary system with non-obstructive 40% proximal LAD,  otherwise normal coronaries     Exercise Stress Test (07/2015) 1. Positive study (1.92mm) during Stage II Bruce protocol. No chronotropic  incompetence. 2. Normal heart rate and BP response to exercise. Of note, in early recovery,  pt developed narrow complex tachycardia consistent with SVT (16 beats, HR  170s) that was asymptomatic. 3. Exercise terminated due to fatigue. 4. Study done to assess for chronotropic incompetence.   Scheduled Meds:  budesonide (PULMICORT) nebulizer solution  0.25 mg Nebulization BID   calcium carbonate  1 tablet Oral BID   furosemide  20 mg Oral Daily   heparin  1,500 Units Intravenous Once   hydrALAZINE  25 mg Oral Q8H   isosorbide mononitrate  30 mg Oral Daily   latanoprost  1 drop Both Eyes QHS   loratadine  10 mg Oral Daily   metoprolol succinate  25 mg Oral Daily   sodium chloride flush  3 mL Intravenous Q12H   tamsulosin  0.4 mg Oral QPM    Continuous Infusions:  sodium chloride 10 mL/hr at 09/14/22 1800   cefTRIAXone  (ROCEPHIN)  IV 2 g (09/15/22 2112)   doxycycline (VIBRAMYCIN) IV 100 mg (09/15/22 2244)   metronidazole 500 mg (09/16/22 0913)    PRN Meds: sodium chloride, acetaminophen **OR** acetaminophen, albuterol, gabapentin, guaiFENesin,  mouth rinse, polyethylene glycol   IMPRESSION & RECOMMENDATIONS: LOUCAS LINSCHEID is a 89 y.o. African-American male whose past medical history and cardiac risk factors include:  Persistent atrial fibrillation (last DCCV 12/16/2015), HFrEF, COPD, history of syncope, nonobstructive CAD, type 2 diabetes, hypertension, OSA on CPAP.   Impression: NSTEMI likely secondary to demand ischemia in the setting of pneumonia, UTI, anemia, acute febrile illness Atrial fibrillation, persistent. Long-term oral anticoagulation. Anemia. Chronic HFrEF, stage C, NYHA class II. Cardiomyopathy. Nonobstructive CAD. Diabetes mellitus type 2.   Hypertension.  Plan: Patient presented to the hospital with acute febrile illness with subjective fevers, chills, productive cough and was started on treatment for community-acquired pneumonia.  He did not have any anginal chest pain or heart failure symptoms on arrival but high sensitive troponins were incidentally checked.  The initial high sensitive troponin came back as 1623 which was also the peak.  Given his significant change in troponin levels compared to his baseline trend and multiple cardiovascular risk factors he was started on IV heparin drip and troponins were trended and echocardiogram was performed.  Troponins have now down trended.  An echocardiogram notes no change in LVEF compared to prior studies.  No obvious regional wall motion abnormalities.  Shared decision is to discontinue IV heparin drip as his presentation was likely secondary to demand ischemia in the setting of pneumonia/UTI and anemia.  Known history of cardiomyopathy/chronic HFrEF.  Continue diuresis, hydralazine/isosorbide, metoprolol.  Will hold off on SGLT2  inhibitors given his urinary tract infection at this time.  Will hold off on MRAs secondary to acute kidney injury.  Further up titration of GDMT can be continued as outpatient with his cardiologist at the [facility].  Known history of persistent atrial fibrillation-continue AV nodal blocking agents for rate control strategy.  Was on Eliquis prior to arrival to the ED.  Later was transitioned to heparin due to concerns for ACS.  Since for discontinuing IV heparin drip we did reconsider initiation of Eliquis for thromboembolic prophylaxis.  However patient is noted to have anemia with a hemoglobin of 8.8 g/dL.  In July 2024 as per Care Everywhere his hemoglobin was around 11 g/dL.  He does not endorse evidence of bleeding.  Primary team currently proceeding forward with anemia workup.  Will hold off on anticoagulation until the workup is complete.  Depending on the results of his anemia workup the role of anticoagulation will need to be rediscussed prior to discharge. Primary team will follow up as per my discussion w/ attending physician. Reach out with questions and concerns.   I have encouraged both the patient and his wife to follow-up with his primary cardiologist at the [facility] status post discharge.  Will sign off for now reach out if questions or concerns arise during his hospitalization.   Patient's questions and concerns were addressed to his satisfaction. He voices understanding of the instructions provided during this encounter.   This note was created using a voice recognition software as a result there may be grammatical errors inadvertently enclosed that do not reflect the nature of this encounter. Every attempt is made to correct such errors.  Delilah Shan [facility]  Pager:  [phone] Office: [phone] 09/16/2022, 12:09 PM

## 2022-09-16 NOTE — Progress Notes (Signed)
   09/16/22 2320  BiPAP/CPAP/SIPAP  BiPAP/CPAP/SIPAP Pt Type Adult (DS)  BiPAP/CPAP/SIPAP DREAMSTATIOND  Mask Type Nasal mask  Mask Size Medium  Respiratory Rate 16 breaths/min  EPAP 10 cmH2O (Ramped from 4 to max of 10 cmh20)  FiO2 (%) 21 %  Patient Home Equipment No   Pt. set up with CPAP DS for h/s, remains on room air, uses Nasal Pillows at home and agreed to try, Wife remains at bedside, aware.

## 2022-09-16 NOTE — Progress Notes (Addendum)
PROGRESS NOTE    Jordan Hoffman  GLO:[id number]  DOB: [birth year]  DOA: 09/13/2022 PCP: [facility] Specialists:   Hospital course:  89 year old man with HTN, COPD, DM 2, OSA, HFrEF and persistent AF was admitted with fever.  Workup has revealed CAP as well as UTI, urine collected on 8/28 revealed Klebsiella.  He was started on ceftriaxone and doxycycline as well as Flagyl.  He was also noted to have elevated troponin although denied chest pain.  Was seen by cardiology who noted this was likely type II NSTEMI and patient was placed on heparin.  Patient is usually followed at the [facility].  Subjective:  Patient states that he does feel better than on admission.  Notes that his fever is gone and he no longer feels cold.  Also thinks he has a little bit more energy.  Patient himself denied shortness of breath but admits to being fatigued.  Patient's wife feels that he is less energetic than he was yesterday.  Discussed slowly decreasing H&H, patient and wife both note that he has no previously known history of GI bleed in the past.  Of note he is on Eliquis at home for A-fib/secondary stroke prevention.  Patient denies black stools.   Objective: Vitals:   09/15/22 2310 09/16/22 0549 09/16/22 0848 09/16/22 1318  BP:  127/75  110/78  Pulse: 84 75  76  Resp: 14 19  14   Temp:  98.3 F (36.8 C)  98.6 F (37 C)  TempSrc:  Oral  Oral  SpO2: 100% 98% 98% 99%  Weight:      Height:        Intake/Output Summary (Last 24 hours) at 09/16/2022 1633 Last data filed at 09/16/2022 1112 Gross per 24 hour  Intake 1068.85 ml  Output 1850 ml  Net -781.15 ml   Filed Weights   09/13/22 1330  Weight: 81 kg     Exam:  General: Tired and weak appearing man lying in bed with attentive and caring wife at bedside.  His respiratory rate is normal although he does seem to have mildly labored breath. Eyes: sclera anicteric, conjuctiva mild injection bilaterally CVS: S1-S2, regular   Respiratory: Reasonably good air entry without any wheezes, no rales. GI: NABS, soft, NT  LE: Warm and well-perfused Neuro: A/O x 3,  grossly nonfocal.  Psych: patient is logical and coherent, judgement and insight appear normal, mood and affect appropriate to situation.  Data Reviewed:  Basic Metabolic Panel: Recent Labs  Lab 09/13/22 1435 09/14/22 0500 09/15/22 0510 09/16/22 0100  NA 145 135 137 136  K 2.2* 4.3 4.2 3.9  CL 127* 107 111 108  CO2 14* 21* 20* 19*  GLUCOSE 78 118* 121* 108*  BUN 25* 39* 32* 30*  CREATININE 0.86 1.56* 1.52* 1.40*  CALCIUM 5.3* 8.9 8.9 8.8*  MG  --  2.0 1.9  --     CBC: Recent Labs  Lab 09/13/22 1435 09/14/22 0500 09/15/22 0510 09/16/22 0100  WBC 10.2 14.0* 10.3 8.7  NEUTROABS 9.1*  --   --   --   HGB 10.3* 9.6* 9.3* 8.8*  HCT 33.2* 30.9* 30.3* 28.4*  MCV 89.5 89.8 91.0 90.7  PLT 178 151 136* 135*     Scheduled Meds:  budesonide (PULMICORT) nebulizer solution  0.25 mg Nebulization BID   calcium carbonate  1 tablet Oral BID   furosemide  20 mg Oral Daily   hydrALAZINE  25 mg Oral Q8H   isosorbide mononitrate  30 mg Oral Daily   latanoprost  1 drop Both Eyes QHS   loratadine  10 mg Oral Daily   metoprolol succinate  25 mg Oral Daily   sodium chloride flush  3 mL Intravenous Q12H   tamsulosin  0.4 mg Oral QPM   Continuous Infusions:  sodium chloride 10 mL/hr at 09/14/22 1800   cefTRIAXone (ROCEPHIN)  IV 2 g (09/15/22 2112)   doxycycline (VIBRAMYCIN) IV 100 mg (09/16/22 1235)   metronidazole 500 mg (09/16/22 0913)     Assessment & Plan:   CAP/possible aspiration pneumonia UTI COPD Continue ceftriaxone for UTI and doxycycline for CAP Flagyl was added for concerns for possible aspiration pneumonia Continue oxygen support Will restart patient's home inhaled steroids and continue as needed albuterol Consider starting oral steroids if patient appears to be short of breath tomorrow Appreciate SLP evaluation who recommended  regular diet with thin liquids  Elevated troponin/type II NSTEMI HFrEF Persistent AF Very much appreciate ongoing cardiology consultation Patient has received 48 hours of heparin for type II NSTEMI Troponins have peaked and are trending down, patient has not had any chest pain Echocardiogram shows no change in LV function Continue CHF management with Lasix, hydralazine, Imdur and Toprol-XL Patient is followed closely at the [facility]  Slow decrease in H&H Patient denies previous history of GI bleed Denies black stools Will discontinue IV heparin although patient is on Eliquis at home Patient is hemodynamically stable Stool for Hemoccult is ordered Repeat H&H  If H&H are stable, restart Eliquis prior to discharge If they continue to trend down, patient will need GI consult Anemia labs ordered  BPH Continue tamsulosin     DVT prophylaxis: Heparin drip DC'd today, will place SCDs Code Status: Full Family Communication: Wife was at bedside throughout    Studies: ECHOCARDIOGRAM COMPLETE  Result Date: 09/15/2022    ECHOCARDIOGRAM REPORT   Patient Name:   Jordan Hoffman Date of Exam: 09/15/2022 Medical Rec #:  [id number]   Height:       76.0 in Accession #:    [id number]  Weight:       178.6 lb Date of Birth:  03-04-1928    BSA:          2.111 m Patient Age:    89 years    BP:           117/85 mmHg Patient Gender: M           HR:           74 bpm. Exam Location:  Inpatient Procedure: 2D Echo, Color Doppler and Cardiac Doppler Indications:    Elevated Troponin  History:        Patient has prior history of Echocardiogram examinations, most                 recent 09/26/2021. Cardiomyopathy, Previous Myocardial                 Infarction, COPD and ILD, CKD, Arrythmias:Atrial Fibrillation;                 Risk Factors:Hypertension, Dyslipidemia, Diabetes and Sleep                 Apnea.  Sonographer:    Milbert Coulter Referring Phys: [id number] PRATIK D [facility] IMPRESSIONS  1. Left ventricular ejection  fraction, by estimation, is 30 to 35%. Left ventricular ejection fraction by 2D MOD biplane is 32.3 %. The left ventricle has moderately decreased function. The left ventricle  demonstrates global hypokinesis. There is moderate left ventricular hypertrophy. Left ventricular diastolic function could not be evaluated.  2. Right ventricular systolic function is mildly reduced. The right ventricular size is normal. There is moderately elevated pulmonary artery systolic pressure. The estimated right ventricular systolic pressure is 53.7 mmHg.  3. Left atrial size was moderately dilated.  4. Right atrial size was severely dilated.  5. The mitral valve is abnormal. Trivial mitral valve regurgitation.  6. Tricuspid valve regurgitation is mild to moderate.  7. The aortic valve is calcified. Aortic valve regurgitation is not visualized. Aortic valve sclerosis/calcification is present, without any evidence of aortic stenosis. Aortic valve mean gradient measures 5.0 mmHg.  8. Aortic dilatation noted. There is mild dilatation of the ascending aorta, measuring 42 mm.  9. The inferior vena cava is dilated in size with <50% respiratory variability, suggesting right atrial pressure of 15 mmHg. Comparison(s): Changes from prior study are noted. 09/26/2021: LVEF 30-35%. FINDINGS  Left Ventricle: Left ventricular ejection fraction, by estimation, is 30 to 35%. Left ventricular ejection fraction by 2D MOD biplane is 32.3 %. The left ventricle has moderately decreased function. The left ventricle demonstrates global hypokinesis. The left ventricular internal cavity size was normal in size. There is moderate left ventricular hypertrophy. Left ventricular diastolic function could not be evaluated due to atrial fibrillation. Left ventricular diastolic function could not be evaluated. Right Ventricle: The right ventricular size is normal. No increase in right ventricular wall thickness. Right ventricular systolic function is mildly reduced.  There is moderately elevated pulmonary artery systolic pressure. The tricuspid regurgitant velocity is 3.11 m/s, and with an assumed right atrial pressure of 15 mmHg, the estimated right ventricular systolic pressure is 53.7 mmHg. Left Atrium: Left atrial size was moderately dilated. Right Atrium: Right atrial size was severely dilated. Pericardium: There is no evidence of pericardial effusion. Mitral Valve: The mitral valve is abnormal. There is mild calcification of the anterior and posterior mitral valve leaflet(s). Mild to moderate mitral annular calcification. Trivial mitral valve regurgitation. Tricuspid Valve: The tricuspid valve is grossly normal. Tricuspid valve regurgitation is mild to moderate. Aortic Valve: The aortic valve is calcified. Aortic valve regurgitation is not visualized. Aortic valve sclerosis/calcification is present, without any evidence of aortic stenosis. Aortic valve mean gradient measures 5.0 mmHg. Aortic valve peak gradient measures 8.6 mmHg. Aortic valve area, by VTI measures 2.15 cm. Pulmonic Valve: The pulmonic valve was grossly normal. Pulmonic valve regurgitation is trivial. Aorta: Aortic dilatation noted. There is mild dilatation of the ascending aorta, measuring 42 mm. Venous: The inferior vena cava is dilated in size with less than 50% respiratory variability, suggesting right atrial pressure of 15 mmHg. IAS/Shunts: No atrial level shunt detected by color flow Doppler.  LEFT VENTRICLE PLAX 2D                        Biplane EF (MOD) LVIDd:         4.50 cm         LV Biplane EF:   Left LVIDs:         3.40 cm                          ventricular LV PW:         1.40 cm                          ejection LV  IVS:        1.30 cm                          fraction by LVOT diam:     2.50 cm                          2D MOD LV SV:         54                               biplane is LV SV Index:   26                               32.3 %. LVOT Area:     4.91 cm                                 Diastology                                LV e' medial:    6.53 cm/s LV Volumes (MOD)               LV E/e' medial:  18.1 LV vol d, MOD    126.4 ml      LV e' lateral:   10.20 cm/s A2C:                           LV E/e' lateral: 11.6 LV vol d, MOD    130.8 ml A4C: LV vol s, MOD    89.7 ml A2C: LV vol s, MOD    83.5 ml A4C: LV SV MOD A2C:   36.6 ml LV SV MOD A4C:   130.8 ml LV SV MOD BP:    41.9 ml RIGHT VENTRICLE RV Basal diam:  3.40 cm RV Mid diam:    2.30 cm RV S prime:     9.14 cm/s TAPSE (M-mode): 2.0 cm LEFT ATRIUM             Index        RIGHT ATRIUM           Index LA diam:        3.40 cm 1.61 cm/m   RA Area:     33.60 cm LA Vol (A2C):   92.1 ml 43.63 ml/m  RA Volume:   116.00 ml 54.95 ml/m LA Vol (A4C):   78.5 ml 37.19 ml/m LA Biplane Vol: 88.1 ml 41.73 ml/m  AORTIC VALVE AV Area (Vmax):    2.33 cm AV Area (Vmean):   2.22 cm AV Area (VTI):     2.15 cm AV Vmax:           147.00 cm/s AV Vmean:          98.600 cm/s AV VTI:            0.254 m AV Peak Grad:      8.6 mmHg AV Mean Grad:      5.0 mmHg LVOT Vmax:         69.80 cm/s LVOT Vmean:        44.600 cm/s LVOT VTI:  0.111 m LVOT/AV VTI ratio: 0.44  AORTA Ao Root diam: 3.90 cm Ao Asc diam:  4.20 cm MITRAL VALVE                TRICUSPID VALVE MV Area (PHT): 4.57 cm     TR Peak grad:   38.7 mmHg MV Decel Time: 166 msec     TR Vmax:        311.00 cm/s MV E velocity: 118.00 cm/s                             SHUNTS                             Systemic VTI:  0.11 m                             Systemic Diam: 2.50 cm Jordan Shutter MD Electronically signed by Jordan Shutter MD Signature Date/Time: 09/15/2022/2:47:04 PM    Final     Principal Problem:   Community acquired pneumonia Active Problems:   Atrial fibrillation (HCC)   Hypokalemia   Febrile illness   NSTEMI (non-ST elevated myocardial infarction) (HCC)   Cardiomyopathy (HCC)   Benign hypertension   Demand ischemia   Long term (current) use of anticoagulants   Anemia   Non-ST  elevation (NSTEMI) myocardial infarction (HCC)     Jordan Hoffman, [facility]  If 7PM-7AM, please contact night-coverage [email or web address]   LOS: 3 days

## 2022-09-16 NOTE — Plan of Care (Signed)

## 2022-09-17 DIAGNOSIS — Z7901 Long term (current) use of anticoagulants: Secondary | ICD-10-CM | POA: Diagnosis not present

## 2022-09-17 DIAGNOSIS — I4819 Other persistent atrial fibrillation: Secondary | ICD-10-CM | POA: Diagnosis not present

## 2022-09-17 DIAGNOSIS — I214 Non-ST elevation (NSTEMI) myocardial infarction: Secondary | ICD-10-CM | POA: Diagnosis not present

## 2022-09-17 DIAGNOSIS — I2489 Other forms of acute ischemic heart disease: Secondary | ICD-10-CM | POA: Diagnosis not present

## 2022-09-17 LAB — CBC
HCT: 29.9 % — ABNORMAL LOW (ref 39.0–52.0)
Hemoglobin: 9.4 g/dL — ABNORMAL LOW (ref 13.0–17.0)
MCH: 28 pg (ref 26.0–34.0)
MCHC: 31.4 g/dL (ref 30.0–36.0)
MCV: 89 fL (ref 80.0–100.0)
Platelets: 176 10*3/uL (ref 150–400)
RBC: 3.36 MIL/uL — ABNORMAL LOW (ref 4.22–5.81)
RDW: 16.3 % — ABNORMAL HIGH (ref 11.5–15.5)
WBC: 6.7 10*3/uL (ref 4.0–10.5)
nRBC: 0 % (ref 0.0–0.2)

## 2022-09-17 LAB — BASIC METABOLIC PANEL
Anion gap: 9 (ref 5–15)
BUN: 26 mg/dL — ABNORMAL HIGH (ref 8–23)
CO2: 22 mmol/L (ref 22–32)
Calcium: 9.5 mg/dL (ref 8.9–10.3)
Chloride: 110 mmol/L (ref 98–111)
Creatinine, Ser: 1.36 mg/dL — ABNORMAL HIGH (ref 0.61–1.24)
GFR, Estimated: 49 mL/min — ABNORMAL LOW (ref >60)
Glucose, Bld: 88 mg/dL (ref 70–99)
Potassium: 4.1 mmol/L (ref 3.5–5.1)
Sodium: 141 mmol/L (ref 135–145)

## 2022-09-17 LAB — RETICULOCYTES
Immature Retic Fract: 16.8 % — ABNORMAL HIGH (ref 2.3–15.9)
RBC.: 3.39 MIL/uL — ABNORMAL LOW (ref 4.22–5.81)
Retic Count, Absolute: 31.2 10*3/uL (ref 19.0–186.0)
Retic Ct Pct: 0.9 % (ref 0.4–3.1)

## 2022-09-17 LAB — IRON AND TIBC
Iron: 31 ug/dL — ABNORMAL LOW (ref 45–182)
Saturation Ratios: 11 % — ABNORMAL LOW (ref 17.9–39.5)
TIBC: 279 ug/dL (ref 250–450)
UIBC: 248 ug/dL

## 2022-09-17 LAB — FERRITIN: Ferritin: 78 ng/mL (ref 24–336)

## 2022-09-17 LAB — VITAMIN B12: Vitamin B-12: 240 pg/mL (ref 180–914)

## 2022-09-17 LAB — FOLATE: Folate: 17 ng/mL (ref >5.9)

## 2022-09-17 LAB — MAGNESIUM: Magnesium: 1.8 mg/dL (ref 1.7–2.4)

## 2022-09-17 MED ORDER — APIXABAN 2.5 MG PO TABS
2.5000 mg | ORAL_TABLET | Freq: Two times a day (BID) | ORAL | Status: DC
  Administered 2022-09-17 – 2022-09-18 (×2): 2.5 mg via ORAL
  Filled 2022-09-17 (×2): qty 1

## 2022-09-17 NOTE — Progress Notes (Signed)
PROGRESS NOTE    Jordan Hoffman  OZD:[id number]  DOB: 02-10-1928  DOA: 09/13/2022 PCP: [facility]:   Hospital course:  89 year old man with HTN, COPD, DM 2, OSA, HFrEF and persistent AF was admitted with fever.  Workup has revealed CAP as well as UTI, urine collected on 8/28 revealed Klebsiella.  He was started on ceftriaxone and doxycycline as well as Flagyl.  He was also noted to have elevated troponin although denied chest pain.  Was seen by cardiology who noted this was likely type II NSTEMI and patient was placed on heparin.  Patient is usually followed at the [facility].  Subjective:  Patient notes he feels better.  Shortness of breath is better.  He is off his oxygen.  Has walked a little bit but is weak.  Reviewed stable H&H with family, noted we would restart the Eliquis and repeat H&H tomorrow.  They are wondering when he might go home although they realize he is quite weak.  Objective: Vitals:   09/17/22 0816 09/17/22 0944 09/17/22 1330 09/17/22 1446  BP:  137/86 116/76 116/63  Pulse:  78 86   Resp:   18   Temp:   98.9 F (37.2 C)   TempSrc:   Oral   SpO2: 93%  96%   Weight:      Height:        Intake/Output Summary (Last 24 hours) at 09/17/2022 1640 Last data filed at 09/17/2022 1600 Gross per 24 hour  Intake 340 ml  Output 2125 ml  Net -1785 ml   Filed Weights   09/13/22 1330  Weight: 81 kg     Exam:  General: Patient is brighter, appears much more comfortable, normal RR with normal WOB, patient is off oxygen, in good spirits Eyes: sclera anicteric, conjuctiva mild injection bilaterally CVS: S1-S2, regular  Respiratory: Reasonably good air entry without any wheezes, no rales. GI: NABS, soft, NT  LE: Warm and well-perfused Neuro: A/O x 3,  grossly nonfocal.  Psych: patient is logical and coherent, judgement and insight appear normal, mood and affect appropriate to situation.  Data Reviewed:  Basic Metabolic  Panel: Recent Labs  Lab 09/13/22 1435 09/14/22 0500 09/15/22 0510 09/16/22 0100 09/17/22 0532  NA 145 135 137 136 141  K 2.2* 4.3 4.2 3.9 4.1  CL 127* 107 111 108 110  CO2 14* 21* 20* 19* 22  GLUCOSE 78 118* 121* 108* 88  BUN 25* 39* 32* 30* 26*  CREATININE 0.86 1.56* 1.52* 1.40* 1.36*  CALCIUM 5.3* 8.9 8.9 8.8* 9.5  MG  --  2.0 1.9  --  1.8    CBC: Recent Labs  Lab 09/13/22 1435 09/14/22 0500 09/15/22 0510 09/16/22 0100 09/17/22 0532  WBC 10.2 14.0* 10.3 8.7 6.7  NEUTROABS 9.1*  --   --   --   --   HGB 10.3* 9.6* 9.3* 8.8* 9.4*  HCT 33.2* 30.9* 30.3* 28.4* 29.9*  MCV 89.5 89.8 91.0 90.7 89.0  PLT 178 151 136* 135* 176     Scheduled Meds:  apixaban  2.5 mg Oral BID   budesonide  1 mg Nebulization BID   calcium carbonate  1 tablet Oral BID   docusate sodium  100 mg Oral Daily   furosemide  20 mg Oral Daily   hydrALAZINE  25 mg Oral Q8H   isosorbide mononitrate  30 mg Oral Daily   latanoprost  1 drop Both Eyes QHS   loratadine  10 mg Oral Daily  metoprolol succinate  25 mg Oral Daily   sodium chloride flush  3 mL Intravenous Q12H   tamsulosin  0.4 mg Oral QPM   Continuous Infusions:  sodium chloride 10 mL/hr at 09/14/22 1800   cefTRIAXone (ROCEPHIN)  IV 2 g (09/16/22 2047)   doxycycline (VIBRAMYCIN) IV 100 mg (09/17/22 1103)   metronidazole 500 mg (09/17/22 0944)     Assessment & Plan:   CAP/possible aspiration pneumonia UTI COPD Patient is improved compared to yesterday, he has been weaned off oxygen Home inhaled steroids were restarted yesterday as well Continue ceftriaxone for UTI and doxycycline for CAP Flagyl was added for concerns for possible aspiration pneumonia Patient can be transitioned over to oral antibiotics once disposition is decided upon Appreciate SLP evaluation who recommended regular diet with thin liquids  Slow decrease in H&H Repeat H&H are stable Will restart Eliquis today and check H&H tomorrow to ensure stability If H&H  stable, patient could be discharged after PT evaluation per their recommendations  Disposition PT consult placed for recommendations for discharge  Elevated troponin/type II NSTEMI HFrEF Persistent AF Appreciate cardiology consultation Patient has received 48 hours of heparin for type II NSTEMI Troponins have peaked and are trending down, patient has not had any chest pain Echocardiogram shows no change in LV function Continue CHF management with Lasix, hydralazine, Imdur and Toprol-XL Patient is followed closely at the [facility] by cardiology there  BPH Continue tamsulosin     DVT prophylaxis: Heparin drip DC'd today, will place SCDs Code Status: Full Family Communication: Wife was at bedside throughout    Studies: No results found.  Principal Problem:   Community acquired pneumonia Active Problems:   Atrial fibrillation (HCC)   Hypokalemia   Febrile illness   NSTEMI (non-ST elevated myocardial infarction) (HCC)   Cardiomyopathy (HCC)   Benign hypertension   Demand ischemia   Long term (current) use of anticoagulants   Anemia   Non-ST elevation (NSTEMI) myocardial infarction (HCC)     Sheree Lalla Tublu Mahogany [place], [facility]  If 7PM-7AM, please contact night-coverage [email or web address]   LOS: 4 days

## 2022-09-17 NOTE — Plan of Care (Signed)

## 2022-09-17 NOTE — Progress Notes (Signed)
   09/17/22 2001  BiPAP/CPAP/SIPAP  BiPAP/CPAP/SIPAP Pt Type Adult (Not in Rm.)  Reason BIPAP/CPAP not in use Non-compliant;Other(comment) (Unable to tolerate Nasal mask/uses Nasal Pillows @home .)

## 2022-09-17 NOTE — Progress Notes (Signed)
Cpap not in room

## 2022-09-17 NOTE — Progress Notes (Signed)
SATURATION QUALIFICATIONS: (This note is used to comply with regulatory documentation for home oxygen)  Patient Saturations on Room Air at Rest = 96%  Patient Saturations on Room Air while Ambulating = 92%  Patient Saturations on 0 Liters of oxygen while Ambulating = 92%  Please briefly explain why patient needs home oxygen: 

## 2022-09-18 DIAGNOSIS — J189 Pneumonia, unspecified organism: Secondary | ICD-10-CM | POA: Diagnosis not present

## 2022-09-18 LAB — CBC
HCT: 30.3 % — ABNORMAL LOW (ref 39.0–52.0)
Hemoglobin: 9.5 g/dL — ABNORMAL LOW (ref 13.0–17.0)
MCH: 27.7 pg (ref 26.0–34.0)
MCHC: 31.4 g/dL (ref 30.0–36.0)
MCV: 88.3 fL (ref 80.0–100.0)
Platelets: 189 10*3/uL (ref 150–400)
RBC: 3.43 MIL/uL — ABNORMAL LOW (ref 4.22–5.81)
RDW: 16.1 % — ABNORMAL HIGH (ref 11.5–15.5)
WBC: 5.9 10*3/uL (ref 4.0–10.5)
nRBC: 0 % (ref 0.0–0.2)

## 2022-09-18 LAB — BASIC METABOLIC PANEL
Anion gap: 8 (ref 5–15)
BUN: 25 mg/dL — ABNORMAL HIGH (ref 8–23)
CO2: 21 mmol/L — ABNORMAL LOW (ref 22–32)
Calcium: 9 mg/dL (ref 8.9–10.3)
Chloride: 106 mmol/L (ref 98–111)
Creatinine, Ser: 1.24 mg/dL (ref 0.61–1.24)
GFR, Estimated: 55 mL/min — ABNORMAL LOW (ref >60)
Glucose, Bld: 102 mg/dL — ABNORMAL HIGH (ref 70–99)
Potassium: 3.6 mmol/L (ref 3.5–5.1)
Sodium: 135 mmol/L (ref 135–145)

## 2022-09-18 LAB — CULTURE, BLOOD (ROUTINE X 2)
Culture: NO GROWTH
Culture: NO GROWTH

## 2022-09-18 MED ORDER — DOXYCYCLINE HYCLATE 100 MG PO TABS
100.0000 mg | ORAL_TABLET | Freq: Two times a day (BID) | ORAL | 0 refills | Status: DC
Start: 2022-09-18 — End: 2022-09-18

## 2022-09-18 MED ORDER — SACCHAROMYCES BOULARDII 250 MG PO CAPS: 250.0000 mg | ORAL_CAPSULE | Freq: Two times a day (BID) | ORAL | 0 refills | Status: AC

## 2022-09-18 MED ORDER — GUAIFENESIN-DM 100-10 MG/5ML PO SYRP: 5.0000 mL | ORAL_SOLUTION | Freq: Three times a day (TID) | ORAL | 0 refills | Status: AC | PRN

## 2022-09-18 MED ORDER — DOXYCYCLINE HYCLATE 100 MG PO TABS
100.0000 mg | ORAL_TABLET | Freq: Two times a day (BID) | ORAL | 0 refills | Status: AC
Start: 2022-09-18 — End: 2022-09-23

## 2022-09-18 MED ORDER — CEFDINIR 300 MG PO CAPS: 300.0000 mg | ORAL_CAPSULE | Freq: Two times a day (BID) | ORAL | 0 refills | Status: AC

## 2022-09-18 NOTE — Evaluation (Signed)
Physical Therapy Evaluation Patient Details Name: Jordan Hoffman MRN: [id number] DOB: March 20, 1928 Today's Date: 09/18/2022  History of Present Illness  89 year old man with HTN, COPD, DM 2, OSA, HFrEF and persistent AF was admitted with fever.  Workup has revealed CAP as well as UTI, urine collected on 8/28 revealed Klebsiella.  He was started on ceftriaxone and doxycycline as well as Flagyl.  He was also noted to have elevated troponin although denied chest pain.  Was seen by cardiology who noted this was likely type II NSTEMI and patient was placed on heparin  Clinical Impression  Pt admitted with above diagnosis. Pt ambulated 110' with RW, no loss of balance, SpO2 96% on room air 1 minute after walking. Pt has 24 hour assist at home from his wife. At baseline he walks with a RW and has assist for ADLs. He is mobilizing well enough to DC home.  Pt currently with functional limitations due to the deficits listed below (see PT Problem List). Pt will benefit from acute skilled PT to increase their independence and safety with mobility to allow discharge.           If plan is discharge home, recommend the following: A little help with bathing/dressing/bathroom;Assistance with cooking/housework;Assist for transportation;Help with stairs or ramp for entrance   Can travel by private vehicle        Equipment Recommendations None recommended by PT  Recommendations for Other Services       Functional Status Assessment Patient has had a recent decline in their functional status and demonstrates the ability to make significant improvements in function in a reasonable and predictable amount of time.     Precautions / Restrictions Precautions Precautions: Fall Precaution Comments: pt/family deny falls in past 6 months Restrictions Weight Bearing Restrictions: No      Mobility  Bed Mobility               General bed mobility comments: up on edge of bed    Transfers Overall transfer level:  Needs assistance Equipment used: Rolling walker (2 wheels) Transfers: Sit to/from Stand Sit to Stand: From elevated surface, Contact guard assist           General transfer comment: STS x 2 with RW    Ambulation/Gait Ambulation/Gait assistance: Contact guard assist Gait Distance (Feet): 110 Feet Assistive device: Rolling walker (2 wheels) Gait Pattern/deviations: WFL(Within Functional Limits) Gait velocity: WFL     General Gait Details: steady, no loss of balance, SpO2 96% on room air ~1 minute after walking  Stairs            Wheelchair Mobility     Tilt Bed    Modified Rankin (Stroke Patients Only)       Balance Overall balance assessment: Modified Independent                                           Pertinent Vitals/Pain Pain Assessment Pain Assessment: No/denies pain    Home Living Family/patient expects to be discharged to:: Private residence Living Arrangements: Spouse/significant other Available Help at Discharge: Family;Available 24 hours/day Type of Home: House Home Access: Level entry     Alternate Level Stairs-Number of Steps: 2 Home Layout: Two level Home Equipment: Cane - single point;Rolling Walker (2 wheels);Rollator (4 wheels);Grab bars - tub/shower;Shower seat      Prior Function  Mobility Comments: walks in home with RW ADLs Comments: wife assists with ADLs     Extremity/Trunk Assessment   Upper Extremity Assessment Upper Extremity Assessment: Defer to OT evaluation (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)    Lower Extremity Assessment Lower Extremity Assessment: Overall WFL for tasks assessed    Cervical / Trunk Assessment Cervical / Trunk Assessment: Normal  Communication   Communication Communication: No apparent difficulties  Cognition Arousal: Alert Behavior During Therapy: WFL for tasks assessed/performed Overall Cognitive Status: Within Functional Limits for tasks assessed                                           General Comments      Exercises     Assessment/Plan    PT Assessment Patient needs continued PT services  PT Problem List Decreased activity tolerance;Decreased mobility       PT Treatment Interventions Gait training;Functional mobility training;Therapeutic exercise    PT Goals (Current goals can be found in the Care Plan section)  Acute Rehab PT Goals Patient Stated Goal: likes to walk in the back yard PT Goal Formulation: With patient/family Time For Goal Achievement: 10/02/22 Potential to Achieve Goals: Good    Frequency Min 1X/week     Co-evaluation               AM-PAC PT "6 Clicks" Mobility  Outcome Measure Help needed turning from your back to your side while in a flat bed without using bedrails?: A Little Help needed moving from lying on your back to sitting on the side of a flat bed without using bedrails?: A Little Help needed moving to and from a bed to a chair (including a wheelchair)?: A Little Help needed standing up from a chair using your arms (e.g., wheelchair or bedside chair)?: A Little Help needed to walk in hospital room?: A Little Help needed climbing 3-5 steps with a railing? : A Little 6 Click Score: 18    End of Session Equipment Utilized During Treatment: Gait belt Activity Tolerance: Patient tolerated treatment well Patient left: in chair;with call bell/phone within reach;with family/visitor present Nurse Communication: Mobility status PT Visit Diagnosis: Difficulty in walking, not elsewhere classified (R26.2)    Time: 1610-9604 PT Time Calculation (min) (ACUTE ONLY): 23 min   Charges:   PT Evaluation $PT Eval Moderate Complexity: 1 Mod PT Treatments $Gait Training: 8-22 mins PT General Charges $$ ACUTE PT VISIT: 1 Visit         Tamala Ser PT 09/18/2022  Acute Rehabilitation Services  Office [phone]

## 2022-09-18 NOTE — Progress Notes (Signed)
Patient discharged home, discharge instructions given and explained to patient/wife and children. They verbalized understanding. Patient denies any pain/distress. Accompanied home by wife and children

## 2022-09-18 NOTE — TOC Progression Note (Signed)
Transition of Care [facility]) - Progression Note    Patient Details  Name: Jordan Hoffman MRN: [id number] Date of Birth: July 29, 1928  Transition of Care [facility]) CM/SW Contact  Howell Rucks, RN Phone Number: 09/18/2022, 12:11 PM  Clinical Narrative:  Met with and pt's family at bedside to introduce role of TOC/NCM and review for dc planning, PT recommendation for [facility] PT. Spouse reports pt is currently receiving HH RN services through the [place], reports she will contact pt's PCP through the [place] to request [facility] PT services, reports pt has a cane at home he uses as needed, family to provide transportation at discharge. TOC will continue to follow.          Expected Discharge Plan and Services                                               Social Determinants of Health (SDOH) Interventions SDOH Screenings   Food Insecurity: No Food Insecurity (09/14/2022)  Housing: Low Risk  (09/14/2022)  Transportation Needs: No Transportation Needs (09/14/2022)  Utilities: Not At Risk (09/14/2022)  Social Connections: Unknown (06/10/2022)   Received from [facility]  Stress: No Stress Concern Present (06/19/2022)   Received from [facility]  Tobacco Use: Medium Risk (09/15/2022)    Readmission Risk Interventions     No data to display

## 2022-09-18 NOTE — Plan of Care (Signed)
  Problem: Clinical Measurements: Goal: Ability to maintain clinical measurements within normal limits will improve 09/18/2022 1221 by Shirleen Schirmer, RN Outcome: Adequate for Discharge 09/18/2022 0841 by Shirleen Schirmer, RN Outcome: Progressing Goal: Will remain free from infection 09/18/2022 1221 by Shirleen Schirmer, RN Outcome: Adequate for Discharge 09/18/2022 0841 by Shirleen Schirmer, RN Outcome: Progressing Goal: Diagnostic test results will improve 09/18/2022 1221 by Shirleen Schirmer, RN Outcome: Adequate for Discharge 09/18/2022 0841 by Shirleen Schirmer, RN Outcome: Progressing Goal: Respiratory complications will improve 09/18/2022 1221 by Shirleen Schirmer, RN Outcome: Adequate for Discharge 09/18/2022 0841 by Shirleen Schirmer, RN Outcome: Progressing Goal: Cardiovascular complication will be avoided 09/18/2022 1221 by Shirleen Schirmer, RN Outcome: Adequate for Discharge 09/18/2022 0841 by Shirleen Schirmer, RN Outcome: Progressing

## 2022-09-18 NOTE — Plan of Care (Signed)

## 2022-09-18 NOTE — Discharge Summary (Signed)
Physician Discharge Summary  Jordan Hoffman YNW:[id number] DOB: November 21, 1928 DOA: 09/13/2022  PCP: [facility]  Admit date: 09/13/2022 Discharge date: 09/18/2022  Admitted From: Home Discharge disposition: Home  Recommendations at discharge:  Complete the course of antibiotics and probiotics Mucinex DM for cough Continue inhalers/bronchodilators   Brief narrative: Jordan Hoffman is a 89 y.o. male with PMH significant for DM2, HTN, HLD, A-fib, chronic systolic CHF, OSA, COPD, back pain 8/28, patient presented to the ED with complaint of fever, shortness of breath for several days.  Initial workup showed low potassium at 2.2, low bicarb at 14, Respiratory virus panel unremarkable Urinalysis with hazy yellow urine with large leukocytes, positive nitrite CTA chest showed bibasilar opacities, R>L Was started on broad-spectrum IV antibiotics, oxygen Troponin was elevated to 1600 but without any anginal symptoms. Per cardiology recommendation, patient was started on IV heparin drip Admitted to [facility] Urine culture eventually grew Klebsiella.  Subjective: Patient was seen and examined this morning.  Pleasant elderly African-American male.  Sitting up in recliner.  Not in distress.  No new symptoms.  Walked in the hallway earlier with mobility tech.  Wife at bedside.  We had a long conversation about patient's current medical condition and the updates.  Patient and wife are comfortable with discharge plan today. Remains hemodynamically stable Last set of labs from this morning with creatinine improved to 1.24, potassium 3.6, hemoglobin stable at 9.5  Hospital course: Bibasilar pneumonia Acute exacerbation of COPD Presented with complaint of fever, shortness of breath Chest imaging with bibasilar opacities Currently on broad-spectrum coverage with IV ceftriaxone, Flagyl, doxycycline Clinically improved.  Complains of mild cough for which I would suggest Mucinex DM SLP eval did not show  any evidence of aspiration I would discharge on 5 more days of oral Omnicef and doxycycline with probiotics Continue bronchodilators including budesonide Recent Labs  Lab 09/13/22 1446 09/13/22 1822 09/14/22 0500 09/15/22 0510 09/16/22 0100 09/17/22 0532 09/18/22 0507  WBC  --   --  14.0* 10.3 8.7 6.7 5.9  LATICACIDVEN 1.1 1.4  --   --   --   --   --    Klebsiella UTI Urine culture grew Klebsiella  Antibiotic plan as above.  Demand ischemia Chronic systolic CHF Persistent A-fib In the absence of anginal symptoms, troponin was noted to be elevated to 1600s and gradually down trended Echocardiogram 8/30 showed known low EF of 30 to 35% and global hypokinesis.  No new WMA.  Elevated right atrial systolic pressure to 54. Currently on CHF regimen with Toprol, Lasix, hydralazine, Imdur Follows up with cardiology at [facility] Continue anticoagulation with Eliquis  Chronic anemia Stable hemoglobin between 9 and 10.  No active bleeding Recent Labs    09/25/21 1227 09/26/21 0729 09/27/21 0508 09/28/21 0510 10/13/21 1823 09/14/22 0500 09/15/22 0510 09/16/22 0100 09/17/22 0532 09/18/22 0507  HGB  --    < > 13.0 13.5   < > 9.6* 9.3* 8.8* 9.4* 9.5*  MCV  --    < > 88.7 87.9   < > 89.8 91.0 90.7 89.0 88.3  VITAMINB12  --   --   --  489  --   --   --   --  240  --   FOLATE  --   --   --  18.9  --   --   --   --  17.0  --   FERRITIN 113  --  76 91  --   --   --   --  78  --   TIBC  --   --   --  346  --   --   --   --  279  --   IRON  --   --   --  62  --   --   --   --  31*  --   RETICCTPCT  --   --   --  1.8  --   --   --   --  0.9  --    < > = values in this interval not displayed.   BPH Continue tamsulosin   Mobility: Independently able to ambulate  Goals of care   Code Status: Full Code   Wounds:  - Incision (Closed) 09/25/21 Umbilicus Medial;Lower (Active)  Date First Assessed/Time First Assessed: 09/25/21 1710   Location: Umbilicus  Location Orientation: Medial;Lower   Present on Admission: Yes    Assessments 09/25/2021  5:45 PM 10/18/2021  8:01 AM  Dressing Type None Liquid skin adhesive  Dressing -- Clean, Dry, Intact  Site / Wound Assessment Clean;Dry Clean;Dry  Margins Attached edges (approximated) Attached edges (approximated)  Closure Approximated Approximated  Drainage Amount None None  Treatment Cleansed Cleansed     No associated orders.    Discharge Exam:   Vitals:   09/18/22 0919 09/18/22 0933 09/18/22 1114 09/18/22 1309  BP:  133/82  124/82  Pulse:  81  78  Resp:    18  Temp:    98.6 F (37 C)  TempSrc:    Oral  SpO2: 93%  96% 97%  Weight:      Height:        Body mass index is 21.74 kg/m.  General exam: Pleasant, elderly African-American male. Skin: No rashes, lesions or ulcers. HEENT: Atraumatic, normocephalic, no obvious bleeding Lungs: Clear to auscultation bilaterally CVS: Regular rate and rhythm, no murmur GI/Abd soft, nontender, nondistended, bowel sound present CNS: Alert, awake, oriented x 3 Psychiatry: Mood appropriate. Extremities: No pedal edema, no calf tenderness  Follow ups:    Follow-up Information     [facility]   Contact information: [address] [facility] [address] [place] [address] [phone]                 Discharge Instructions:   Discharge Instructions     Call MD for:  difficulty breathing, headache or visual disturbances   Complete by: As directed    Call MD for:  extreme fatigue   Complete by: As directed    Call MD for:  hives   Complete by: As directed    Call MD for:  persistant dizziness or light-headedness   Complete by: As directed    Call MD for:  persistant nausea and vomiting   Complete by: As directed    Call MD for:  severe uncontrolled pain   Complete by: As directed    Call MD for:  temperature >100.4   Complete by: As directed    Diet - low sodium heart healthy   Complete by: As directed    Discharge instructions    Complete by: As directed    Recommendations at discharge:   Complete the course of antibiotics and probiotics  Mucinex DM for cough  Continue inhalers/bronchodilators   Discharge instructions for CHF Check weight daily -preferably same time every day. Restrict fluid intake to 1200 ml daily Restrict salt intake to less than 2 g daily. Call MD if you have one of the following  symptoms 1) 3 pound weight gain in 24 hours or 5 pounds in 1 week  2) swelling in the hands, feet or stomach  3) progressive shortness of breath 4) if you have to sleep on extra pillows at night in order to breathe     General discharge instructions: Follow with Primary [facility] in 7 days  Please request your PCP  to go over your hospital tests, procedures, radiology results at the follow up. Please get your medicines reviewed and adjusted.  Your PCP may decide to repeat certain labs or tests as needed. Do not drive, operate heavy machinery, perform activities at heights, swimming or participation in water activities or provide baby sitting services if your were admitted for syncope or siezures until you have seen by Primary MD or a Neurologist and advised to do so again. [place] Controlled Substance Reporting System database was reviewed. Do not drive, operate heavy machinery, perform activities at heights, swim, participate in water activities or provide baby-sitting services while on medications for pain, sleep and mood until your outpatient physician has reevaluated you and advised to do so again.  You are strongly recommended to comply with the dose, frequency and duration of prescribed medications. Activity: As tolerated with Full fall precautions use walker/cane & assistance as needed Avoid using any recreational substances like cigarette, tobacco, alcohol, or non-prescribed drug. If you experience worsening of your admission symptoms, develop shortness of breath, life threatening  emergency, suicidal or homicidal thoughts you must seek medical attention immediately by calling 911 or calling your MD immediately  if symptoms less severe. You must read complete instructions/literature along with all the possible adverse reactions/side effects for all the medicines you take and that have been prescribed to you. Take any new medicine only after you have completely understood and accepted all the possible adverse reactions/side effects.  Wear Seat belts while driving. You were cared for by a hospitalist during your hospital stay. If you have any questions about your discharge medications or the care you received while you were in the hospital after you are discharged, you can call the unit and ask to speak with the hospitalist or the covering physician. Once you are discharged, your primary care physician will handle any further medical issues. Please note that NO REFILLS for any discharge medications will be authorized once you are discharged, as it is imperative that you return to your primary care physician (or establish a relationship with a primary care physician if you do not have one).   Increase activity slowly   Complete by: As directed        Discharge Medications:   Allergies as of 09/18/2022       Reactions   Lisinopril Cough   Penicillins Hives, Rash, Other (See Comments)   Patient and his wife reported he broke out in bad rash. Last exposed to penicillins over 26yrs ago. Family declined rechallenging pencillins on 09/14/22.    Sulfa Antibiotics Rash, Other (See Comments)   Pravastatin Hives   Atorvastatin Other (See Comments)   Muscle pain   Codeine Hives   Flunisolide Hives, Other (See Comments)   Epistaxis, bleeding from nose   Loratadine Hives   Niacin Hives, Other (See Comments)   Flushing   Niacin And Related    Hives    Simvastatin Hives   weakness Other reaction(s): Weakness present   Statins Hives, Other (See Comments)   Weakness   Ciprofloxacin  Rash   Erythromycin Rash  Ranitidine Rash        Medication List     STOP taking these medications    guaiFENesin 100 MG/5ML liquid Commonly known as: ROBITUSSIN       TAKE these medications    acetaminophen 325 MG tablet Commonly known as: TYLENOL Take 325-650 mg by mouth every 8 (eight) hours.   albuterol (2.5 MG/3ML) 0.083% nebulizer solution Commonly known as: PROVENTIL Take 2.5 mg by nebulization every 4 (four) hours as needed for wheezing or shortness of breath.   albuterol 108 (90 Base) MCG/ACT inhaler Commonly known as: VENTOLIN HFA Inhale 2 puffs into the lungs every 4 (four) hours as needed for wheezing or shortness of breath.   Alvesco 160 MCG/ACT inhaler Generic drug: ciclesonide Inhale 1 puff into the lungs 2 (two) times daily.   apixaban 5 MG Tabs tablet Commonly known as: ELIQUIS Take 2.5 mg by mouth 2 (two) times daily.   calcium carbonate 500 MG chewable tablet Commonly known as: TUMS - dosed in mg elemental calcium Chew 1 tablet by mouth 2 (two) times daily.   carboxymethylcellulose 0.5 % Soln Commonly known as: REFRESH PLUS Place 2 drops into both eyes at bedtime.   cefdinir 300 MG capsule Commonly known as: OMNICEF Take 1 capsule (300 mg total) by mouth 2 (two) times daily for 5 days.   cholecalciferol 25 MCG (1000 UNIT) tablet Commonly known as: VITAMIN D3 Take 1,000 Units by mouth daily.   diclofenac Sodium 1 % Gel Commonly known as: VOLTAREN Apply 2 g topically 4 (four) times daily as needed (shoulder pain).   docusate sodium 100 MG capsule Commonly known as: COLACE Take 1 capsule (100 mg total) by mouth 2 (two) times daily as needed for mild constipation. What changed: when to take this   doxycycline 100 MG tablet Commonly known as: VIBRA-TABS Take 1 tablet (100 mg total) by mouth 2 (two) times daily for 5 days.   fluticasone 50 MCG/ACT nasal spray Commonly known as: FLONASE Place 2 sprays into both nostrils daily as  needed for allergies or rhinitis.   furosemide 20 MG tablet Commonly known as: LASIX Take 20 mg by mouth daily.   gabapentin 100 MG capsule Commonly known as: NEURONTIN Take 100 mg by mouth See admin instructions. Take 100mg  by mouth twice a day and may take an extra 100mg  midday if needed.   guaiFENesin-dextromethorphan 100-10 MG/5ML syrup Commonly known as: ROBITUSSIN DM Take 5 mLs by mouth every 8 (eight) hours as needed for cough.   hydrALAZINE 25 MG tablet Commonly known as: APRESOLINE Take 25 mg by mouth every 8 (eight) hours.   isosorbide mononitrate 30 MG 24 hr tablet Commonly known as: IMDUR Take 30 mg by mouth daily.   latanoprost 0.005 % ophthalmic solution Commonly known as: XALATAN Place 1 drop into both eyes at bedtime.   loratadine 10 MG tablet Commonly known as: CLARITIN Take 10 mg by mouth daily.   metoprolol succinate 50 MG 24 hr tablet Commonly known as: TOPROL-XL Take 25 mg by mouth daily. **Hold for HR >60   saccharomyces boulardii 250 MG capsule Commonly known as: FLORASTOR Take 1 capsule (250 mg total) by mouth 2 (two) times daily for 5 days.   tamsulosin 0.4 MG Caps capsule Commonly known as: FLOMAX Take 0.4 mg by mouth every evening.   terbinafine 1 % cream Commonly known as: LAMISIL Apply 1 Application topically 2 (two) times daily.         The results of significant diagnostics  from this hospitalization (including imaging, microbiology, ancillary and laboratory) are listed below for reference.    Procedures and Diagnostic Studies:   CT Angio Chest PE W and/or Wo Contrast  Result Date: 09/13/2022 CLINICAL DATA:  Fever, shortness of breath and congestion. EXAM: CT ANGIOGRAPHY CHEST WITH CONTRAST TECHNIQUE: Multidetector CT imaging of the chest was performed using the standard protocol during bolus administration of intravenous contrast. Multiplanar CT image reconstructions and MIPs were obtained to evaluate the vascular anatomy.  RADIATION DOSE REDUCTION: This exam was performed according to the departmental dose-optimization program which includes automated exposure control, adjustment of the mA and/or kV according to patient size and/or use of iterative reconstruction technique. CONTRAST:  OMNIPAQUE IOHEXOL 350 MG/ML SOLN COMPARISON:  Chest x-ray 09/13/2022 earlier. Older x-rays as well. No prior chest CT FINDINGS: Cardiovascular: Enlarged heart particularly the right atrium. Trace pericardial fluid. The thoracic aorta has a normal course and caliber with scattered vascular calcifications. Coronary artery calcifications are seen there is breathing motion seen throughout the examination. This limits evaluation for pulmonary emboli, nondiagnostic for small and peripheral emboli. No segmental or larger pulmonary embolism identified. There is some enlargement of the central pulmonary arteries. Please correlate for any evidence of pulmonary artery hypertension. Mediastinum/Nodes: No specific abnormal lymph node enlargement identified in the axillary regions. Enlarged right hilar lymph node identified on series 4, image 66 measuring 2.9 by 1.8 cm. There are some small mediastinal nodes as well. This includes a right-sided precarinal node measuring 16 x 29 mm. A few other smaller nodes elsewhere. Patulous fluid-filled esophagus. Lungs/Pleura: Breathing motion. Area of consolidative opacity seen at the right lung base. Mild more bandlike opacity left lung base. Tiny pleural effusions. Apical pleural thickening. Centrilobular paraseptal lung changes identified. Air cysts in the right lower lobe measuring 2.7 cm. This may have some luminal fluid Upper Abdomen: Adrenal glands are preserved in the upper abdomen. Previous cholecystectomy. Musculoskeletal: Moderate degenerative changes along the spine. Scattered degenerative changes. Review of the MIP images confirms the above findings. IMPRESSION: Breathing motion. No segmental or larger  pulmonary embolism. There is some enlargement of the pulmonary arteries. Please correlate for evidence of pulmonary artery hypertension. Enlarged heart particularly the right atrium. Consolidative opacity in the right lung base. More bandlike opacity left lung base. Tiny effusions. Infiltrative process is possible. Recommend follow-up. Few enlarged right hilar and mediastinal lymph nodes. These also can be assessed on follow-up. Aortic Atherosclerosis (ICD10-I70.0) and Emphysema (ICD10-J43.9). Electronically Signed   By: Karen Kays M.D.   On: 09/13/2022 17:49   DG Chest Port 1 View  Result Date: 09/13/2022 CLINICAL DATA:  Questionable sepsis.  Shortness of breath EXAM: PORTABLE CHEST 1 VIEW COMPARISON:  Chest x-ray 11/09/2021 FINDINGS: Enlarged cardiopericardial silhouette. Interstitial changes once again identified. No consolidation, pneumothorax or effusion. Slight elevation of the left hemidiaphragm. Slight left basilar atelectasis. Overlapping cardiac leads. Degenerative changes of the spine and shoulders. IMPRESSION: Enlarged cardiopericardial silhouette. Slight elevation left hemidiaphragm. Slight left basilar atelectasis. Extensive interstitial changes, similar to previous. Please correlate with the history. Electronically Signed   By: Karen Kays M.D.   On: 09/13/2022 15:00     Labs:   Basic Metabolic Panel: Recent Labs  Lab 09/14/22 0500 09/15/22 0510 09/16/22 0100 09/17/22 0532 09/18/22 0507  NA 135 137 136 141 135  K 4.3 4.2 3.9 4.1 3.6  CL 107 111 108 110 106  CO2 21* 20* 19* 22 21*  GLUCOSE 118* 121* 108* 88 102*  BUN 39* 32* 30*  26* 25*  CREATININE 1.56* 1.52* 1.40* 1.36* 1.24  CALCIUM 8.9 8.9 8.8* 9.5 9.0  MG 2.0 1.9  --  1.8  --    GFR Estimated Creatinine Clearance: 43.5 mL/min (by C-G formula based on SCr of 1.24 mg/dL). Liver Function Tests: Recent Labs  Lab 09/13/22 1435 09/14/22 0500  AST 12* 22  ALT 7 12  ALKPHOS 43 74  BILITOT 0.7 1.3*  PROT 3.6* 6.3*   ALBUMIN 1.9* 3.1*   No results for input(s): "LIPASE", "AMYLASE" in the last 168 hours. No results for input(s): "AMMONIA" in the last 168 hours. Coagulation profile Recent Labs  Lab 09/13/22 1435 09/14/22 0500  INR 1.4* 1.5*    CBC: Recent Labs  Lab 09/13/22 1435 09/14/22 0500 09/15/22 0510 09/16/22 0100 09/17/22 0532 09/18/22 0507  WBC 10.2 14.0* 10.3 8.7 6.7 5.9  NEUTROABS 9.1*  --   --   --   --   --   HGB 10.3* 9.6* 9.3* 8.8* 9.4* 9.5*  HCT 33.2* 30.9* 30.3* 28.4* 29.9* 30.3*  MCV 89.5 89.8 91.0 90.7 89.0 88.3  PLT 178 151 136* 135* 176 189   Cardiac Enzymes: No results for input(s): "CKTOTAL", "CKMB", "CKMBINDEX", "TROPONINI" in the last 168 hours. BNP: Invalid input(s): "POCBNP" CBG: No results for input(s): "GLUCAP" in the last 168 hours. D-Dimer No results for input(s): "DDIMER" in the last 72 hours. Hgb A1c No results for input(s): "HGBA1C" in the last 72 hours. Lipid Profile No results for input(s): "CHOL", "HDL", "LDLCALC", "TRIG", "CHOLHDL", "LDLDIRECT" in the last 72 hours. Thyroid function studies No results for input(s): "TSH", "T4TOTAL", "T3FREE", "THYROIDAB" in the last 72 hours.  Invalid input(s): "FREET3" Anemia work up Recent Labs    09/17/22 0532  VITAMINB12 240  FOLATE 17.0  FERRITIN 78  TIBC 279  IRON 31*  RETICCTPCT 0.9   Microbiology Recent Results (from the past 240 hour(s))  Resp panel by RT-PCR (RSV, Flu A&B, Covid) Anterior Nasal Swab     Status: None   Collection Time: 09/13/22  1:39 PM   Specimen: Anterior Nasal Swab  Result Value Ref Range Status   SARS Coronavirus 2 by RT PCR NEGATIVE NEGATIVE Final    Comment: (NOTE) SARS-CoV-2 target nucleic acids are NOT DETECTED.  The SARS-CoV-2 RNA is generally detectable in upper respiratory specimens during the acute phase of infection. The lowest concentration of SARS-CoV-2 viral copies this assay can detect is 138 copies/mL. A negative result does not preclude  SARS-Cov-2 infection and should not be used as the sole basis for treatment or other patient management decisions. A negative result may occur with  improper specimen collection/handling, submission of specimen other than nasopharyngeal swab, presence of viral mutation(s) within the areas targeted by this assay, and inadequate number of viral copies(<138 copies/mL). A negative result must be combined with clinical observations, patient history, and epidemiological information. The expected result is Negative.  Fact Sheet for Patients:  BloggerCourse.com  Fact Sheet for Healthcare Providers:  SeriousBroker.it  This test is no t yet approved or cleared by the [place] FDA and  has been authorized for detection and/or diagnosis of SARS-CoV-2 by FDA under an Emergency Use Authorization (EUA). This EUA will remain  in effect (meaning this test can be used) for the duration of the COVID-19 declaration under Section 564(b)(1) of the Act, 21 U.S.C.section 360bbb-3(b)(1), unless the authorization is terminated  or revoked sooner.       Influenza A by PCR NEGATIVE NEGATIVE Final   Influenza B  by PCR NEGATIVE NEGATIVE Final    Comment: (NOTE) The Xpert Xpress SARS-CoV-2/FLU/RSV plus assay is intended as an aid in the diagnosis of influenza from Nasopharyngeal swab specimens and should not be used as a sole basis for treatment. Nasal washings and aspirates are unacceptable for Xpert Xpress SARS-CoV-2/FLU/RSV testing.  Fact Sheet for Patients: BloggerCourse.com  Fact Sheet for Healthcare Providers: SeriousBroker.it  This test is not yet approved or cleared by the [place] FDA and has been authorized for detection and/or diagnosis of SARS-CoV-2 by FDA under an Emergency Use Authorization (EUA). This EUA will remain in effect (meaning this test can be used) for the duration of  the COVID-19 declaration under Section 564(b)(1) of the Act, 21 U.S.C. section 360bbb-3(b)(1), unless the authorization is terminated or revoked.     Resp Syncytial Virus by PCR NEGATIVE NEGATIVE Final    Comment: (NOTE) Fact Sheet for Patients: BloggerCourse.com  Fact Sheet for Healthcare Providers: SeriousBroker.it  This test is not yet approved or cleared by the [place] FDA and has been authorized for detection and/or diagnosis of SARS-CoV-2 by FDA under an Emergency Use Authorization (EUA). This EUA will remain in effect (meaning this test can be used) for the duration of the COVID-19 declaration under Section 564(b)(1) of the Act, 21 U.S.C. section 360bbb-3(b)(1), unless the authorization is terminated or revoked.  Performed at [facility], [address] [place], [place] [address]   Blood Culture (routine x 2)     Status: None   Collection Time: 09/13/22  2:35 PM   Specimen: BLOOD  Result Value Ref Range Status   Specimen Description   Final    BLOOD SITE NOT SPECIFIED Performed at [facility], [address] [place], [place] [address]    Special Requests   Final    BOTTLES DRAWN AEROBIC AND ANAEROBIC Blood Culture results may not be optimal due to an excessive volume of blood received in culture bottles Performed at [facility], [address] [place], [place] [address]    Culture   Final    NO GROWTH 5 DAYS Performed at [facility], [address], [place], [place] [address]    Report Status 09/18/2022 FINAL  Final  Blood Culture (routine x 2)     Status: None   Collection Time: 09/13/22  2:35 PM   Specimen: BLOOD  Result Value Ref Range Status   Specimen Description   Final    BLOOD SITE NOT SPECIFIED Performed at [facility], [address], [place], [place] [address]    Special Requests   Final    BOTTLES DRAWN  AEROBIC AND ANAEROBIC Blood Culture results may not be optimal due to an excessive volume of blood received in culture bottles Performed at [facility], [address], [place], [place] [address]    Culture   Final    NO GROWTH 5 DAYS Performed at [facility], [address], [place], [place] [address]    Report Status 09/18/2022 FINAL  Final  Urine Culture     Status: Abnormal   Collection Time: 09/13/22  8:29 PM   Specimen: Urine, Random  Result Value Ref Range Status   Specimen Description   Final    URINE, RANDOM Performed at [facility], [address] [place], [place] [address]    Special Requests   Final    NONE Reflexed from [id number] Performed at [facility], [address] [place], [place] [address]    Culture >=100,000 COLONIES/mL KLEBSIELLA PNEUMONIAE (A)  Final   Report Status 09/15/2022 FINAL  Final   Organism ID, Bacteria KLEBSIELLA PNEUMONIAE (A)  Final      Susceptibility   Klebsiella pneumoniae - MIC*    AMPICILLIN RESISTANT Resistant     CEFAZOLIN <=4 SENSITIVE Sensitive     CEFEPIME <=0.12 SENSITIVE Sensitive     CEFTRIAXONE <=0.25 SENSITIVE Sensitive     CIPROFLOXACIN <=0.25 SENSITIVE Sensitive     GENTAMICIN <=1 SENSITIVE Sensitive     IMIPENEM <=0.25 SENSITIVE Sensitive     NITROFURANTOIN 64 INTERMEDIATE Intermediate     TRIMETH/SULFA <=20 SENSITIVE Sensitive     AMPICILLIN/SULBACTAM 4 SENSITIVE Sensitive     PIP/TAZO <=4 SENSITIVE Sensitive     * >=100,000 COLONIES/mL KLEBSIELLA PNEUMONIAE    Time coordinating discharge: 45 minutes  Signed: Sreekar Broyhill  [facility] 09/18/2022, 1:29 PM
